# Patient Record
Sex: Female | Born: 1940 | Race: White | Hispanic: No | Smoking: Former smoker
Health system: Southern US, Community
[De-identification: ages and names within clinical notes are randomized; demographics above are authoritative.]

## PROBLEM LIST (undated history)

## (undated) DIAGNOSIS — M1990 Unspecified osteoarthritis, unspecified site: Secondary | ICD-10-CM

## (undated) DIAGNOSIS — K219 Gastro-esophageal reflux disease without esophagitis: Secondary | ICD-10-CM

## (undated) DIAGNOSIS — K317 Polyp of stomach and duodenum: Secondary | ICD-10-CM

## (undated) DIAGNOSIS — M2550 Pain in unspecified joint: Secondary | ICD-10-CM

## (undated) DIAGNOSIS — Z8639 Personal history of other endocrine, nutritional and metabolic disease: Secondary | ICD-10-CM

## (undated) DIAGNOSIS — C3491 Malignant neoplasm of unspecified part of right bronchus or lung: Secondary | ICD-10-CM

## (undated) DIAGNOSIS — M810 Age-related osteoporosis without current pathological fracture: Secondary | ICD-10-CM

## (undated) DIAGNOSIS — E7800 Pure hypercholesterolemia, unspecified: Secondary | ICD-10-CM

## (undated) HISTORY — PX: APPENDECTOMY: SHX54

## (undated) HISTORY — DX: Personal history of other endocrine, nutritional and metabolic disease: Z86.39

## (undated) HISTORY — PX: BREAST SURGERY: SHX581

## (undated) HISTORY — DX: Pure hypercholesterolemia, unspecified: E78.00

## (undated) HISTORY — PX: TONSILLECTOMY: SUR1361

## (undated) HISTORY — PX: EYE SURGERY: SHX253

## (undated) HISTORY — DX: Age-related osteoporosis without current pathological fracture: M81.0

## (undated) HISTORY — PX: ABDOMINAL HYSTERECTOMY: SHX81

## (undated) HISTORY — DX: Pain in unspecified joint: M25.50

## (undated) HISTORY — PX: BREAST EXCISIONAL BIOPSY: SUR124

## (undated) HISTORY — DX: Polyp of stomach and duodenum: K31.7

## (undated) HISTORY — DX: Malignant neoplasm of unspecified part of right bronchus or lung: C34.91

---

## 2005-02-22 ENCOUNTER — Encounter: Admission: RE | Admit: 2005-02-22 | Discharge: 2005-02-22 | Payer: Self-pay | Admitting: Obstetrics and Gynecology

## 2006-02-25 ENCOUNTER — Encounter: Admission: RE | Admit: 2006-02-25 | Discharge: 2006-02-25 | Payer: Self-pay | Admitting: Obstetrics and Gynecology

## 2007-02-27 ENCOUNTER — Encounter: Admission: RE | Admit: 2007-02-27 | Discharge: 2007-02-27 | Payer: Self-pay | Admitting: Obstetrics and Gynecology

## 2008-03-01 ENCOUNTER — Encounter: Admission: RE | Admit: 2008-03-01 | Discharge: 2008-03-01 | Payer: Self-pay | Admitting: Obstetrics and Gynecology

## 2008-03-25 ENCOUNTER — Encounter: Admission: RE | Admit: 2008-03-25 | Discharge: 2008-03-25 | Payer: Self-pay | Admitting: Obstetrics and Gynecology

## 2008-08-16 ENCOUNTER — Ambulatory Visit: Payer: Self-pay | Admitting: Internal Medicine

## 2008-08-16 DIAGNOSIS — K5900 Constipation, unspecified: Secondary | ICD-10-CM | POA: Insufficient documentation

## 2008-08-25 ENCOUNTER — Encounter: Payer: Self-pay | Admitting: Internal Medicine

## 2008-08-25 ENCOUNTER — Ambulatory Visit: Payer: Self-pay | Admitting: Internal Medicine

## 2008-08-26 ENCOUNTER — Encounter: Payer: Self-pay | Admitting: Internal Medicine

## 2008-10-28 ENCOUNTER — Encounter: Admission: RE | Admit: 2008-10-28 | Discharge: 2008-10-28 | Payer: Self-pay | Admitting: Family Medicine

## 2009-03-02 ENCOUNTER — Encounter: Admission: RE | Admit: 2009-03-02 | Discharge: 2009-03-02 | Payer: Self-pay | Admitting: Obstetrics and Gynecology

## 2009-06-16 ENCOUNTER — Encounter: Admission: RE | Admit: 2009-06-16 | Discharge: 2009-06-16 | Payer: Self-pay | Admitting: Family Medicine

## 2009-12-21 ENCOUNTER — Encounter: Admission: RE | Admit: 2009-12-21 | Discharge: 2009-12-21 | Payer: Self-pay | Admitting: Family Medicine

## 2010-03-13 ENCOUNTER — Encounter: Admission: RE | Admit: 2010-03-13 | Discharge: 2010-03-13 | Payer: Self-pay | Admitting: Obstetrics and Gynecology

## 2010-05-06 ENCOUNTER — Encounter: Payer: Self-pay | Admitting: Obstetrics and Gynecology

## 2010-08-16 ENCOUNTER — Other Ambulatory Visit: Payer: Self-pay | Admitting: Family Medicine

## 2010-11-01 ENCOUNTER — Ambulatory Visit
Admission: RE | Admit: 2010-11-01 | Discharge: 2010-11-01 | Disposition: A | Discharge status: Home / Self Care | Payer: Federal, State, Local not specified - PPO | Source: Ambulatory Visit | Attending: Family Medicine | Admitting: Family Medicine

## 2010-12-20 ENCOUNTER — Other Ambulatory Visit: Payer: Self-pay | Admitting: Family Medicine

## 2010-12-20 DIAGNOSIS — R9389 Abnormal findings on diagnostic imaging of other specified body structures: Secondary | ICD-10-CM

## 2011-01-02 ENCOUNTER — Other Ambulatory Visit: Payer: Federal, State, Local not specified - PPO

## 2011-01-02 ENCOUNTER — Ambulatory Visit
Admission: RE | Admit: 2011-01-02 | Discharge: 2011-01-02 | Disposition: A | Discharge status: Home / Self Care | Payer: Medicare Other | Source: Ambulatory Visit | Attending: Family Medicine | Admitting: Family Medicine

## 2011-01-02 DIAGNOSIS — R9389 Abnormal findings on diagnostic imaging of other specified body structures: Secondary | ICD-10-CM

## 2011-03-16 ENCOUNTER — Other Ambulatory Visit: Payer: Self-pay | Admitting: Obstetrics and Gynecology

## 2011-03-16 DIAGNOSIS — Z1231 Encounter for screening mammogram for malignant neoplasm of breast: Secondary | ICD-10-CM

## 2011-03-22 ENCOUNTER — Ambulatory Visit
Admission: RE | Admit: 2011-03-22 | Discharge: 2011-03-22 | Disposition: A | Discharge status: Home / Self Care | Payer: Medicare Other | Source: Ambulatory Visit | Attending: Obstetrics and Gynecology | Admitting: Obstetrics and Gynecology

## 2011-03-22 DIAGNOSIS — Z1231 Encounter for screening mammogram for malignant neoplasm of breast: Secondary | ICD-10-CM

## 2011-04-29 DIAGNOSIS — J019 Acute sinusitis, unspecified: Secondary | ICD-10-CM | POA: Diagnosis not present

## 2011-05-08 DIAGNOSIS — H35379 Puckering of macula, unspecified eye: Secondary | ICD-10-CM | POA: Diagnosis not present

## 2011-05-08 DIAGNOSIS — H43399 Other vitreous opacities, unspecified eye: Secondary | ICD-10-CM | POA: Diagnosis not present

## 2011-05-08 DIAGNOSIS — H43819 Vitreous degeneration, unspecified eye: Secondary | ICD-10-CM | POA: Diagnosis not present

## 2011-06-06 DIAGNOSIS — Z124 Encounter for screening for malignant neoplasm of cervix: Secondary | ICD-10-CM | POA: Diagnosis not present

## 2011-06-20 DIAGNOSIS — M353 Polymyalgia rheumatica: Secondary | ICD-10-CM | POA: Diagnosis not present

## 2011-06-20 DIAGNOSIS — IMO0001 Reserved for inherently not codable concepts without codable children: Secondary | ICD-10-CM | POA: Diagnosis not present

## 2011-07-04 DIAGNOSIS — E7800 Pure hypercholesterolemia, unspecified: Secondary | ICD-10-CM | POA: Diagnosis not present

## 2011-07-04 DIAGNOSIS — R42 Dizziness and giddiness: Secondary | ICD-10-CM | POA: Diagnosis not present

## 2011-07-04 DIAGNOSIS — F411 Generalized anxiety disorder: Secondary | ICD-10-CM | POA: Diagnosis not present

## 2011-07-04 DIAGNOSIS — Z8639 Personal history of other endocrine, nutritional and metabolic disease: Secondary | ICD-10-CM | POA: Diagnosis not present

## 2011-07-04 DIAGNOSIS — J329 Chronic sinusitis, unspecified: Secondary | ICD-10-CM | POA: Diagnosis not present

## 2011-07-04 DIAGNOSIS — Z79899 Other long term (current) drug therapy: Secondary | ICD-10-CM | POA: Diagnosis not present

## 2011-07-05 DIAGNOSIS — J329 Chronic sinusitis, unspecified: Secondary | ICD-10-CM | POA: Diagnosis not present

## 2011-07-05 DIAGNOSIS — E7800 Pure hypercholesterolemia, unspecified: Secondary | ICD-10-CM | POA: Diagnosis not present

## 2011-07-05 DIAGNOSIS — R42 Dizziness and giddiness: Secondary | ICD-10-CM | POA: Diagnosis not present

## 2011-07-05 DIAGNOSIS — Z8639 Personal history of other endocrine, nutritional and metabolic disease: Secondary | ICD-10-CM | POA: Diagnosis not present

## 2011-07-05 DIAGNOSIS — F411 Generalized anxiety disorder: Secondary | ICD-10-CM | POA: Diagnosis not present

## 2011-07-05 DIAGNOSIS — Z79899 Other long term (current) drug therapy: Secondary | ICD-10-CM | POA: Diagnosis not present

## 2011-07-24 DIAGNOSIS — J029 Acute pharyngitis, unspecified: Secondary | ICD-10-CM | POA: Diagnosis not present

## 2011-07-24 DIAGNOSIS — J019 Acute sinusitis, unspecified: Secondary | ICD-10-CM | POA: Diagnosis not present

## 2011-08-15 DIAGNOSIS — J3489 Other specified disorders of nose and nasal sinuses: Secondary | ICD-10-CM | POA: Diagnosis not present

## 2011-08-15 DIAGNOSIS — J029 Acute pharyngitis, unspecified: Secondary | ICD-10-CM | POA: Diagnosis not present

## 2011-09-17 DIAGNOSIS — Z961 Presence of intraocular lens: Secondary | ICD-10-CM | POA: Diagnosis not present

## 2011-12-26 DIAGNOSIS — M353 Polymyalgia rheumatica: Secondary | ICD-10-CM | POA: Diagnosis not present

## 2011-12-26 DIAGNOSIS — M545 Low back pain: Secondary | ICD-10-CM | POA: Diagnosis not present

## 2011-12-26 DIAGNOSIS — IMO0001 Reserved for inherently not codable concepts without codable children: Secondary | ICD-10-CM | POA: Diagnosis not present

## 2011-12-26 DIAGNOSIS — M2550 Pain in unspecified joint: Secondary | ICD-10-CM | POA: Diagnosis not present

## 2011-12-31 DIAGNOSIS — Z23 Encounter for immunization: Secondary | ICD-10-CM | POA: Diagnosis not present

## 2012-01-04 DIAGNOSIS — D485 Neoplasm of uncertain behavior of skin: Secondary | ICD-10-CM | POA: Diagnosis not present

## 2012-01-04 DIAGNOSIS — L819 Disorder of pigmentation, unspecified: Secondary | ICD-10-CM | POA: Diagnosis not present

## 2012-01-04 DIAGNOSIS — L821 Other seborrheic keratosis: Secondary | ICD-10-CM | POA: Diagnosis not present

## 2012-01-04 DIAGNOSIS — L820 Inflamed seborrheic keratosis: Secondary | ICD-10-CM | POA: Diagnosis not present

## 2012-01-04 DIAGNOSIS — D1801 Hemangioma of skin and subcutaneous tissue: Secondary | ICD-10-CM | POA: Diagnosis not present

## 2012-01-04 DIAGNOSIS — D235 Other benign neoplasm of skin of trunk: Secondary | ICD-10-CM | POA: Diagnosis not present

## 2012-01-04 DIAGNOSIS — Z808 Family history of malignant neoplasm of other organs or systems: Secondary | ICD-10-CM | POA: Diagnosis not present

## 2012-01-04 DIAGNOSIS — D239 Other benign neoplasm of skin, unspecified: Secondary | ICD-10-CM | POA: Diagnosis not present

## 2012-01-28 ENCOUNTER — Other Ambulatory Visit: Payer: Self-pay | Admitting: Family Medicine

## 2012-01-28 DIAGNOSIS — R911 Solitary pulmonary nodule: Secondary | ICD-10-CM

## 2012-01-30 ENCOUNTER — Ambulatory Visit
Admission: RE | Admit: 2012-01-30 | Discharge: 2012-01-30 | Disposition: A | Discharge status: Home / Self Care | Payer: Medicare Other | Source: Ambulatory Visit | Attending: Family Medicine | Admitting: Family Medicine

## 2012-01-30 DIAGNOSIS — R911 Solitary pulmonary nodule: Secondary | ICD-10-CM

## 2012-01-30 DIAGNOSIS — J984 Other disorders of lung: Secondary | ICD-10-CM | POA: Diagnosis not present

## 2012-02-14 DIAGNOSIS — M79609 Pain in unspecified limb: Secondary | ICD-10-CM | POA: Diagnosis not present

## 2012-02-19 DIAGNOSIS — I8310 Varicose veins of unspecified lower extremity with inflammation: Secondary | ICD-10-CM | POA: Diagnosis not present

## 2012-03-11 DIAGNOSIS — M545 Low back pain: Secondary | ICD-10-CM | POA: Diagnosis not present

## 2012-03-11 DIAGNOSIS — IMO0001 Reserved for inherently not codable concepts without codable children: Secondary | ICD-10-CM | POA: Diagnosis not present

## 2012-03-11 DIAGNOSIS — M2550 Pain in unspecified joint: Secondary | ICD-10-CM | POA: Diagnosis not present

## 2012-03-11 DIAGNOSIS — M353 Polymyalgia rheumatica: Secondary | ICD-10-CM | POA: Diagnosis not present

## 2012-03-12 DIAGNOSIS — J329 Chronic sinusitis, unspecified: Secondary | ICD-10-CM | POA: Diagnosis not present

## 2012-03-17 ENCOUNTER — Other Ambulatory Visit: Payer: Self-pay | Admitting: Obstetrics and Gynecology

## 2012-03-17 DIAGNOSIS — Z1231 Encounter for screening mammogram for malignant neoplasm of breast: Secondary | ICD-10-CM

## 2012-04-18 ENCOUNTER — Ambulatory Visit
Admission: RE | Admit: 2012-04-18 | Discharge: 2012-04-18 | Disposition: A | Discharge status: Home / Self Care | Payer: Medicare Other | Source: Ambulatory Visit | Attending: Obstetrics and Gynecology | Admitting: Obstetrics and Gynecology

## 2012-04-18 DIAGNOSIS — Z1231 Encounter for screening mammogram for malignant neoplasm of breast: Secondary | ICD-10-CM | POA: Diagnosis not present

## 2012-06-03 DIAGNOSIS — M25519 Pain in unspecified shoulder: Secondary | ICD-10-CM | POA: Diagnosis not present

## 2012-06-03 DIAGNOSIS — IMO0001 Reserved for inherently not codable concepts without codable children: Secondary | ICD-10-CM | POA: Diagnosis not present

## 2012-06-03 DIAGNOSIS — M353 Polymyalgia rheumatica: Secondary | ICD-10-CM | POA: Diagnosis not present

## 2012-06-26 DIAGNOSIS — Z01419 Encounter for gynecological examination (general) (routine) without abnormal findings: Secondary | ICD-10-CM | POA: Diagnosis not present

## 2012-06-26 DIAGNOSIS — Z853 Personal history of malignant neoplasm of breast: Secondary | ICD-10-CM | POA: Diagnosis not present

## 2012-09-02 DIAGNOSIS — M353 Polymyalgia rheumatica: Secondary | ICD-10-CM | POA: Diagnosis not present

## 2012-09-02 DIAGNOSIS — M25519 Pain in unspecified shoulder: Secondary | ICD-10-CM | POA: Diagnosis not present

## 2012-09-02 DIAGNOSIS — M545 Low back pain: Secondary | ICD-10-CM | POA: Diagnosis not present

## 2012-10-30 DIAGNOSIS — Z961 Presence of intraocular lens: Secondary | ICD-10-CM | POA: Diagnosis not present

## 2012-12-04 DIAGNOSIS — M353 Polymyalgia rheumatica: Secondary | ICD-10-CM | POA: Diagnosis not present

## 2012-12-04 DIAGNOSIS — M2550 Pain in unspecified joint: Secondary | ICD-10-CM | POA: Diagnosis not present

## 2012-12-04 DIAGNOSIS — M545 Low back pain: Secondary | ICD-10-CM | POA: Diagnosis not present

## 2012-12-04 DIAGNOSIS — IMO0001 Reserved for inherently not codable concepts without codable children: Secondary | ICD-10-CM | POA: Diagnosis not present

## 2012-12-24 DIAGNOSIS — E7800 Pure hypercholesterolemia, unspecified: Secondary | ICD-10-CM | POA: Diagnosis not present

## 2012-12-24 DIAGNOSIS — M810 Age-related osteoporosis without current pathological fracture: Secondary | ICD-10-CM | POA: Diagnosis not present

## 2012-12-24 DIAGNOSIS — R5381 Other malaise: Secondary | ICD-10-CM | POA: Diagnosis not present

## 2012-12-24 DIAGNOSIS — F411 Generalized anxiety disorder: Secondary | ICD-10-CM | POA: Diagnosis not present

## 2012-12-24 DIAGNOSIS — R911 Solitary pulmonary nodule: Secondary | ICD-10-CM | POA: Diagnosis not present

## 2012-12-24 DIAGNOSIS — R079 Chest pain, unspecified: Secondary | ICD-10-CM | POA: Diagnosis not present

## 2012-12-25 ENCOUNTER — Other Ambulatory Visit: Payer: Self-pay | Admitting: Family Medicine

## 2012-12-25 DIAGNOSIS — E7800 Pure hypercholesterolemia, unspecified: Secondary | ICD-10-CM

## 2012-12-31 DIAGNOSIS — M79609 Pain in unspecified limb: Secondary | ICD-10-CM | POA: Diagnosis not present

## 2013-01-08 DIAGNOSIS — L708 Other acne: Secondary | ICD-10-CM | POA: Diagnosis not present

## 2013-01-08 DIAGNOSIS — L821 Other seborrheic keratosis: Secondary | ICD-10-CM | POA: Diagnosis not present

## 2013-01-08 DIAGNOSIS — I8310 Varicose veins of unspecified lower extremity with inflammation: Secondary | ICD-10-CM | POA: Diagnosis not present

## 2013-01-08 DIAGNOSIS — D485 Neoplasm of uncertain behavior of skin: Secondary | ICD-10-CM | POA: Diagnosis not present

## 2013-01-08 DIAGNOSIS — D239 Other benign neoplasm of skin, unspecified: Secondary | ICD-10-CM | POA: Diagnosis not present

## 2013-01-08 DIAGNOSIS — Z808 Family history of malignant neoplasm of other organs or systems: Secondary | ICD-10-CM | POA: Diagnosis not present

## 2013-01-08 DIAGNOSIS — D1801 Hemangioma of skin and subcutaneous tissue: Secondary | ICD-10-CM | POA: Diagnosis not present

## 2013-01-08 DIAGNOSIS — L819 Disorder of pigmentation, unspecified: Secondary | ICD-10-CM | POA: Diagnosis not present

## 2013-01-08 DIAGNOSIS — D2330 Other benign neoplasm of skin of unspecified part of face: Secondary | ICD-10-CM | POA: Diagnosis not present

## 2013-01-08 DIAGNOSIS — L820 Inflamed seborrheic keratosis: Secondary | ICD-10-CM | POA: Diagnosis not present

## 2013-01-12 DIAGNOSIS — I8310 Varicose veins of unspecified lower extremity with inflammation: Secondary | ICD-10-CM | POA: Diagnosis not present

## 2013-01-14 DIAGNOSIS — I8310 Varicose veins of unspecified lower extremity with inflammation: Secondary | ICD-10-CM | POA: Diagnosis not present

## 2013-01-14 DIAGNOSIS — C3491 Malignant neoplasm of unspecified part of right bronchus or lung: Secondary | ICD-10-CM

## 2013-01-14 HISTORY — DX: Malignant neoplasm of unspecified part of right bronchus or lung: C34.91

## 2013-01-20 ENCOUNTER — Other Ambulatory Visit: Payer: Self-pay

## 2013-01-20 ENCOUNTER — Ambulatory Visit
Admission: RE | Admit: 2013-01-20 | Discharge: 2013-01-20 | Disposition: A | Discharge status: Home / Self Care | Payer: Medicare Other | Source: Ambulatory Visit | Attending: Family Medicine | Admitting: Family Medicine

## 2013-01-20 DIAGNOSIS — E7800 Pure hypercholesterolemia, unspecified: Secondary | ICD-10-CM

## 2013-01-20 DIAGNOSIS — R918 Other nonspecific abnormal finding of lung field: Secondary | ICD-10-CM | POA: Insufficient documentation

## 2013-01-20 DIAGNOSIS — J438 Other emphysema: Secondary | ICD-10-CM | POA: Diagnosis not present

## 2013-01-21 ENCOUNTER — Other Ambulatory Visit: Payer: Self-pay | Admitting: *Deleted

## 2013-01-21 ENCOUNTER — Encounter: Payer: Self-pay | Admitting: Thoracic Surgery (Cardiothoracic Vascular Surgery)

## 2013-01-21 ENCOUNTER — Institutional Professional Consult (permissible substitution) (INDEPENDENT_AMBULATORY_CARE_PROVIDER_SITE_OTHER): Payer: Medicare Other | Admitting: Thoracic Surgery (Cardiothoracic Vascular Surgery)

## 2013-01-21 ENCOUNTER — Encounter (HOSPITAL_COMMUNITY): Payer: Self-pay | Admitting: Pharmacist

## 2013-01-21 VITALS — BP 137/68 | HR 90 | Resp 16 | Ht 68.0 in | Wt 148.0 lb

## 2013-01-21 DIAGNOSIS — M810 Age-related osteoporosis without current pathological fracture: Secondary | ICD-10-CM | POA: Diagnosis not present

## 2013-01-21 DIAGNOSIS — R918 Other nonspecific abnormal finding of lung field: Secondary | ICD-10-CM

## 2013-01-21 DIAGNOSIS — M129 Arthropathy, unspecified: Secondary | ICD-10-CM | POA: Diagnosis not present

## 2013-01-21 DIAGNOSIS — E785 Hyperlipidemia, unspecified: Secondary | ICD-10-CM | POA: Diagnosis not present

## 2013-01-21 DIAGNOSIS — H269 Unspecified cataract: Secondary | ICD-10-CM

## 2013-01-21 DIAGNOSIS — M1990 Unspecified osteoarthritis, unspecified site: Secondary | ICD-10-CM | POA: Insufficient documentation

## 2013-01-21 NOTE — Progress Notes (Signed)
PCP is Gaye Alken, MD Referring Provider is Marthe Patch*  Chief Complaint  Patient presents with  . Lung Lesion    RML and RLL...eval and treat...CT CHEST    HPI: 72 year old woman who presents with chief complaint of enlarging lung nodule.  Mrs. Brazzle is a 72 year old woman with a 40-pack-year history of smoking who quit in 2007. She has been followed for about 3-1/2 years now for a groundglass opacity in the superior segment of her right lower lobe. Initially this really did not show any significant change. On her CT in 2013 it does appear that it was becoming a little more prominent. She had a repeat CT done October 7 showed a definite increase in size and now possibly a small soft tissue component. There also is a 5 mm subpleural nodule in the middle lobe that has not changed over time.  She smoked up until 2007 was finally able to quit at that time. She has not had any history of cardiac disease. She denies chest pain or shortness of breath. She does have an occasional cough which is been nonproductive. She denies hemoptysis. She has gained the weight recently. She also has had some decreased energy and cold intolerance. She's not had any history of excessive bleeding or bruising. She did have a laser vein ablation in her left thigh recently and has some bruising from that.   Past Medical History  Diagnosis Date  . Hyperplastic polyps of stomach   . Osteoporosis   . H/O vitamin D deficiency   . Hypercholesteremia   . Joint pain     No past surgical history on file.  Family History  Problem Relation Age of Onset  . Lung cancer Father   . Cancer - Other Brother   . Diabetes Mellitus II Father   . Hypertension Father   . Hyperlipidemia Father     Social History History  Substance Use Topics  . Smoking status: Former Smoker -- 1.00 packs/day for 40 years    Types: Cigarettes    Quit date: 04/16/2005  . Smokeless tobacco: Never Used  . Alcohol Use:  No    Current Outpatient Prescriptions  Medication Sig Dispense Refill  . ALPRAZolam (XANAX) 0.5 MG tablet Take 0.25 mg by mouth 3 (three) times daily as needed for anxiety. Takes 1-2 tablets at a time      . atorvastatin (LIPITOR) 20 MG tablet Take 20 mg by mouth daily.       Marland Kitchen FLUZONE HIGH-DOSE injection       . meclizine (ANTIVERT) 25 MG tablet Take 25 mg by mouth 3 (three) times daily as needed.       . meloxicam (MOBIC) 15 MG tablet 15 mg daily as needed.       . raloxifene (EVISTA) 60 MG tablet Take 60 mg by mouth every other day.        No current facility-administered medications for this visit.    Allergies  Allergen Reactions  . Erythromycin Other (See Comments)    Upset stomach    Review of Systems  Constitutional: Positive for fatigue and unexpected weight change (weight gain). Negative for fever, chills, activity change and appetite change.  Eyes:       Glasses, cataracts  Respiratory: Positive for cough (occasional). Negative for shortness of breath, wheezing and stridor.   Cardiovascular: Positive for leg swelling. Negative for chest pain.       Varicose veins  Musculoskeletal: Positive for joint swelling and myalgias.  Hematological: Negative for adenopathy. Does not bruise/bleed easily.  Psychiatric/Behavioral: The patient is nervous/anxious.   All other systems reviewed and are negative.    BP 137/68  Pulse 90  Resp 16  Ht 5\' 8"  (1.727 m)  Wt 148 lb (67.132 kg)  BMI 22.51 kg/m2 Physical Exam  Vitals reviewed. Constitutional: She is oriented to person, place, and time. She appears well-developed and well-nourished. No distress.  HENT:  Head: Normocephalic and atraumatic.  Eyes: EOM are normal. Pupils are equal, round, and reactive to light.  Neck: Neck supple. No thyromegaly present.  Cardiovascular: Normal rate, regular rhythm and intact distal pulses.  Exam reveals no gallop and no friction rub.   No murmur heard. Pulmonary/Chest: Effort normal and  breath sounds normal. She has no wheezes. She has no rales.  Abdominal: Soft. There is no tenderness.  Musculoskeletal: She exhibits no edema.  ecchymosis left thigh from recent laser vein ablation  Lymphadenopathy:    She has no cervical adenopathy.  Neurological: She is alert and oriented to person, place, and time. No cranial nerve deficit.  No focal motor deficit  Skin: Skin is warm and dry.     Diagnostic Tests: CT chest 01/20/2013 CLINICAL DATA: Followup of pulmonary nodule  EXAM:  CT CHEST WITHOUT CONTRAST  TECHNIQUE:  Multidetector CT imaging of the chest was performed following the  standard protocol without IV contrast.  COMPARISON: CT chest of 01/30/2012  FINDINGS:  Diffuse changes of centrilobular emphysema are again noted. The sub  solid ground-glass opacity within the superior segment of the right  lower lobe posteriorly has definitely increased in size.  Particularly when compared to the CT from September 2012, this sub  solid ground-glass opacities now measures 2.3 x 2.5 centimeter on  coronal images compared to approximately 1.3 x 1.4 centimeter. A  slow growing adenocarcinoma is therefore a definite consideration  and thoracic surgery consultation is recommended. The pleural-based  nodule within the right middle lobe is relatively stable measuring 5  millimeters in diameter. No new pulmonary nodule is seen. No pleural  effusion is noted. The central airway is patent.  On soft tissue window images, the thyroid gland is small and  unremarkable. On this unenhanced study, no mediastinal or hilar  adenopathy is seen. Calcified mediastinal nodes are present  presumably due to prior granulomatous disease. Coronary artery  calcifications also are noted and there is mild cardiomegaly  present. The portion of the upper abdomen that is visualized is  unremarkable other than a few calcified splenic granulomas. Minimal  compression of T8 vertebral body is noted which may  be increased  slightly in the interval.  IMPRESSION:  1. Increase in size of the sub solid ground-glass opacity within the  superior segment of the right lower lobe, concerning for slow  growing adenocarcinoma. Recommend thoracic surgery consultation.  2. Diffuse centrilobular emphysema.  3. Stable pleural-based 5 mm nodule within the right middle lobe.  4. Mild compression deformity of T8.  Electronically Signed  By: Dwyane Dee M.D.  On: 01/20/2013 11:20  Impression: 72 year old woman with an enlarging groundglass opacity in the superior segment of her right lower lobe. As was relatively stable in size for a couple of years but over the past year has shown definite growth. 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 Given that it has increased in size I do recommend that we resect the lesion. I do not think a PET will be helpful in this case given the  groundglass appearance as it most likely will not be PET avid.  She also has a small subpleural nodule in the middle lobe which has not changed. The given that we will be in the chest I recommended we go ahead and perform a wedge resection on that at the same time as it will not cause any significant change in operative time or risk.  We did discuss other options such as continued observation, which would not be recommended, and  biopsy either bronchoscopically or percutaneously, neither of which would definitively rule out low grade adenocarcinoma if they were negative.  I have discussed with Mrs. Schwenn the nature of the procedure, the need for general anesthesia, and the incisions to be used. We discussed the expected hospital stay, overall recovery and short and long term outcomes. She understands the risks include, but are not limited to, death, stroke, MI, DVT/PE, bleeding, possible need for transfusion, infe cardiac arrhythmias, prolonged air leaks, as well as the possibility of unforeseeable  complications.  She does need PFTs prior surgery, but I don't think to be any issues with pulmonary reserve given the limited resection involved.  She understands and accepts the risks and agrees to proceed.  Plan: Pulmonary function tests with room air blood gas  Right VATS, lower lobe segmentectomy, wedge resection of middle lobe on Monday October 13.

## 2013-01-23 ENCOUNTER — Encounter (HOSPITAL_COMMUNITY): Payer: Self-pay

## 2013-01-23 ENCOUNTER — Ambulatory Visit (HOSPITAL_COMMUNITY)
Admission: RE | Admit: 2013-01-23 | Discharge: 2013-01-23 | Disposition: A | Discharge status: Home / Self Care | Payer: Medicare Other | Source: Ambulatory Visit | Attending: Thoracic Surgery (Cardiothoracic Vascular Surgery) | Admitting: Thoracic Surgery (Cardiothoracic Vascular Surgery)

## 2013-01-23 ENCOUNTER — Encounter (HOSPITAL_COMMUNITY)
Admission: RE | Admit: 2013-01-23 | Discharge: 2013-01-23 | Disposition: A | Discharge status: Home / Self Care | Payer: Medicare Other | Source: Ambulatory Visit | Attending: Thoracic Surgery (Cardiothoracic Vascular Surgery) | Admitting: Thoracic Surgery (Cardiothoracic Vascular Surgery)

## 2013-01-23 VITALS — BP 142/73 | HR 70 | Temp 98.1°F | Resp 18 | Ht 68.0 in | Wt 144.8 lb

## 2013-01-23 DIAGNOSIS — Z87891 Personal history of nicotine dependence: Secondary | ICD-10-CM | POA: Insufficient documentation

## 2013-01-23 DIAGNOSIS — R918 Other nonspecific abnormal finding of lung field: Secondary | ICD-10-CM

## 2013-01-23 DIAGNOSIS — R911 Solitary pulmonary nodule: Secondary | ICD-10-CM | POA: Diagnosis not present

## 2013-01-23 DIAGNOSIS — Z01812 Encounter for preprocedural laboratory examination: Secondary | ICD-10-CM | POA: Insufficient documentation

## 2013-01-23 DIAGNOSIS — Z01818 Encounter for other preprocedural examination: Secondary | ICD-10-CM | POA: Insufficient documentation

## 2013-01-23 HISTORY — DX: Unspecified osteoarthritis, unspecified site: M19.90

## 2013-01-23 HISTORY — DX: Gastro-esophageal reflux disease without esophagitis: K21.9

## 2013-01-23 LAB — TYPE AND SCREEN: Antibody Screen: NEGATIVE

## 2013-01-23 LAB — CBC
HCT: 38.9 % (ref 36.0–46.0)
MCH: 30.5 pg (ref 26.0–34.0)
Platelets: 268 10*3/uL (ref 150–400)
RBC: 4.26 MIL/uL (ref 3.87–5.11)
WBC: 6.5 10*3/uL (ref 4.0–10.5)

## 2013-01-23 LAB — PULMONARY FUNCTION TEST

## 2013-01-23 LAB — SURGICAL PCR SCREEN
MRSA, PCR: NEGATIVE
Staphylococcus aureus: NEGATIVE

## 2013-01-23 LAB — COMPREHENSIVE METABOLIC PANEL
ALT: 14 U/L (ref 0–35)
AST: 24 U/L (ref 0–37)
CO2: 23 mEq/L (ref 19–32)
Chloride: 100 mEq/L (ref 96–112)
GFR calc non Af Amer: 68 mL/min — ABNORMAL LOW (ref >90)
Potassium: 3.5 mEq/L (ref 3.5–5.1)
Sodium: 137 mEq/L (ref 135–145)
Total Bilirubin: 0.3 mg/dL (ref 0.3–1.2)

## 2013-01-23 LAB — PROTIME-INR: INR: 1.07 (ref 0.00–1.49)

## 2013-01-23 MED ORDER — ALBUTEROL SULFATE (5 MG/ML) 0.5% IN NEBU
2.5000 mg | INHALATION_SOLUTION | Freq: Once | RESPIRATORY_TRACT | Status: AC
  Administered 2013-01-23: 2.5 mg via RESPIRATORY_TRACT

## 2013-01-23 NOTE — Pre-Procedure Instructions (Signed)
[name]  01/23/2013   Your procedure is scheduled on:  Monday January 26, 2013 at 1031 AM.  Report to [facility] Entrance"A" at 7:30 AM.  Call this number if you have problems the morning of surgery: [phone]   Remember:   Do not eat food or drink liquids after midnight Sunday 01/25/13.   Take these medicines the morning of surgery with A SIP OF WATER: Prilosec                            Stop all Vitamins, Herbal Medication, Aspirin , NSAIDS and Fish oil.   Do not wear jewelry, make-up or nail polish.  Do not wear lotions, powders, or perfumes. You may wear deodorant.  Do not shave 48 hours prior to surgery. .  Do not bring valuables to the hospital.  [facility] is not responsible for any belongings or valuables.                                     Contacts, dentures or bridgework may not be worn into surgery.  Leave suitcase in the car. After surgery it may be brought to your room.  For patients admitted to the hospital, discharge time is determined by your treatment team.               Patients discharged the day of surgery will not be allowed to drive home.  Name and phone number of your driver:   Special Instructions: Shower using CHG 2 nights before surgery and the night before surgery.  If you shower the day of surgery use CHG.  Use special wash - you have one bottle of CHG for all showers.  You should use approximately 1/3 of the bottle for each shower.   Please read over the following fact sheets that you were given: Pain Booklet, Coughing and Deep Breathing, Blood Transfusion Information, MRSA Information and Surgical Site Infection Prevention

## 2013-01-23 NOTE — Progress Notes (Signed)
Per RT ABG was venous will obtain new same DOS

## 2013-01-25 MED ORDER — DEXTROSE 5 % IV SOLN
1.5000 g | INTRAVENOUS | Status: AC
  Administered 2013-01-26: 1.5 g via INTRAVENOUS
  Filled 2013-01-25: qty 1.5

## 2013-01-26 ENCOUNTER — Encounter (HOSPITAL_COMMUNITY): Payer: Self-pay | Admitting: Certified Registered"

## 2013-01-26 ENCOUNTER — Inpatient Hospital Stay (HOSPITAL_COMMUNITY)
Admission: RE | Admit: 2013-01-26 | Discharge: 2013-02-03 | DRG: 164 | Disposition: A | Discharge status: Home / Self Care | Payer: Medicare Other | Source: Ambulatory Visit | Attending: Thoracic Surgery (Cardiothoracic Vascular Surgery) | Admitting: Thoracic Surgery (Cardiothoracic Vascular Surgery)

## 2013-01-26 ENCOUNTER — Encounter (HOSPITAL_COMMUNITY)
Admission: RE | Disposition: A | Discharge status: Home / Self Care | Payer: Self-pay | Source: Ambulatory Visit | Attending: Thoracic Surgery (Cardiothoracic Vascular Surgery)

## 2013-01-26 ENCOUNTER — Encounter (HOSPITAL_COMMUNITY): Payer: Medicare Other | Admitting: Certified Registered"

## 2013-01-26 ENCOUNTER — Inpatient Hospital Stay (HOSPITAL_COMMUNITY): Payer: Medicare Other

## 2013-01-26 ENCOUNTER — Inpatient Hospital Stay (HOSPITAL_COMMUNITY): Payer: Medicare Other | Admitting: Certified Registered"

## 2013-01-26 DIAGNOSIS — Z801 Family history of malignant neoplasm of trachea, bronchus and lung: Secondary | ICD-10-CM

## 2013-01-26 DIAGNOSIS — K5900 Constipation, unspecified: Secondary | ICD-10-CM | POA: Diagnosis not present

## 2013-01-26 DIAGNOSIS — N390 Urinary tract infection, site not specified: Secondary | ICD-10-CM | POA: Diagnosis not present

## 2013-01-26 DIAGNOSIS — J984 Other disorders of lung: Secondary | ICD-10-CM | POA: Diagnosis not present

## 2013-01-26 DIAGNOSIS — C3430 Malignant neoplasm of lower lobe, unspecified bronchus or lung: Principal | ICD-10-CM | POA: Diagnosis present

## 2013-01-26 DIAGNOSIS — M810 Age-related osteoporosis without current pathological fracture: Secondary | ICD-10-CM | POA: Diagnosis present

## 2013-01-26 DIAGNOSIS — M2559 Pain in other specified joint: Secondary | ICD-10-CM | POA: Diagnosis not present

## 2013-01-26 DIAGNOSIS — Z87891 Personal history of nicotine dependence: Secondary | ICD-10-CM

## 2013-01-26 DIAGNOSIS — J95811 Postprocedural pneumothorax: Secondary | ICD-10-CM | POA: Diagnosis not present

## 2013-01-26 DIAGNOSIS — R339 Retention of urine, unspecified: Secondary | ICD-10-CM | POA: Diagnosis present

## 2013-01-26 DIAGNOSIS — D1430 Benign neoplasm of unspecified bronchus and lung: Secondary | ICD-10-CM | POA: Diagnosis not present

## 2013-01-26 DIAGNOSIS — Z902 Acquired absence of lung [part of]: Secondary | ICD-10-CM

## 2013-01-26 DIAGNOSIS — R918 Other nonspecific abnormal finding of lung field: Secondary | ICD-10-CM

## 2013-01-26 DIAGNOSIS — K219 Gastro-esophageal reflux disease without esophagitis: Secondary | ICD-10-CM | POA: Diagnosis not present

## 2013-01-26 DIAGNOSIS — J9383 Other pneumothorax: Secondary | ICD-10-CM | POA: Diagnosis not present

## 2013-01-26 DIAGNOSIS — D62 Acute posthemorrhagic anemia: Secondary | ICD-10-CM | POA: Diagnosis not present

## 2013-01-26 DIAGNOSIS — D131 Benign neoplasm of stomach: Secondary | ICD-10-CM | POA: Diagnosis present

## 2013-01-26 DIAGNOSIS — J90 Pleural effusion, not elsewhere classified: Secondary | ICD-10-CM | POA: Diagnosis not present

## 2013-01-26 DIAGNOSIS — A498 Other bacterial infections of unspecified site: Secondary | ICD-10-CM | POA: Diagnosis present

## 2013-01-26 DIAGNOSIS — R404 Transient alteration of awareness: Secondary | ICD-10-CM | POA: Diagnosis not present

## 2013-01-26 DIAGNOSIS — Z4682 Encounter for fitting and adjustment of non-vascular catheter: Secondary | ICD-10-CM | POA: Diagnosis not present

## 2013-01-26 DIAGNOSIS — Z79899 Other long term (current) drug therapy: Secondary | ICD-10-CM

## 2013-01-26 DIAGNOSIS — J9819 Other pulmonary collapse: Secondary | ICD-10-CM | POA: Diagnosis not present

## 2013-01-26 DIAGNOSIS — E7800 Pure hypercholesterolemia, unspecified: Secondary | ICD-10-CM | POA: Diagnosis present

## 2013-01-26 HISTORY — PX: VIDEO ASSISTED THORACOSCOPY (VATS)/WEDGE RESECTION: SHX6174

## 2013-01-26 HISTORY — PX: LUNG REMOVAL, PARTIAL: SHX233

## 2013-01-26 LAB — BLOOD GAS, ARTERIAL
Acid-Base Excess: 0.5 mmol/L (ref 0.0–2.0)
Bicarbonate: 24.2 mEq/L — ABNORMAL HIGH (ref 20.0–24.0)
Drawn by: 181601
FIO2: 0.21 %
O2 Saturation: 97.7 %
Patient temperature: 98.6
pCO2 arterial: 36 mmHg (ref 35.0–45.0)

## 2013-01-26 LAB — URINALYSIS, ROUTINE W REFLEX MICROSCOPIC
Hgb urine dipstick: NEGATIVE
Nitrite: NEGATIVE
Protein, ur: NEGATIVE mg/dL
Urobilinogen, UA: 0.2 mg/dL (ref 0.0–1.0)
pH: 5.5 (ref 5.0–8.0)

## 2013-01-26 SURGERY — VIDEO ASSISTED THORACOSCOPY (VATS)/WEDGE RESECTION
Anesthesia: General | Site: Chest | Laterality: Right | Wound class: Clean Contaminated

## 2013-01-26 MED ORDER — OXYCODONE HCL 5 MG PO TABS
ORAL_TABLET | ORAL | Status: AC
  Filled 2013-01-26: qty 1

## 2013-01-26 MED ORDER — HYPROMELLOSE (GONIOSCOPIC) 2.5 % OP SOLN
1.0000 [drp] | Freq: Every day | OPHTHALMIC | Status: DC
  Filled 2013-01-26: qty 15

## 2013-01-26 MED ORDER — HYDROMORPHONE HCL PF 1 MG/ML IJ SOLN
INTRAMUSCULAR | Status: AC
  Filled 2013-01-26: qty 1

## 2013-01-26 MED ORDER — OXYCODONE HCL 5 MG PO TABS
5.0000 mg | ORAL_TABLET | ORAL | Status: DC | PRN
  Administered 2013-01-26 – 2013-01-27 (×4): 10 mg via ORAL
  Administered 2013-01-27: 5 mg via ORAL
  Filled 2013-01-26: qty 2
  Filled 2013-01-26: qty 1
  Filled 2013-01-26 (×2): qty 2

## 2013-01-26 MED ORDER — ACETAMINOPHEN 10 MG/ML IV SOLN
1000.0000 mg | Freq: Once | INTRAVENOUS | Status: AC
  Administered 2013-01-26: 1000 mg via INTRAVENOUS

## 2013-01-26 MED ORDER — MIDAZOLAM HCL 5 MG/5ML IJ SOLN
INTRAMUSCULAR | Status: DC | PRN
  Administered 2013-01-26: 2 mg via INTRAVENOUS

## 2013-01-26 MED ORDER — ACETAMINOPHEN 500 MG PO TABS
1000.0000 mg | ORAL_TABLET | Freq: Four times a day (QID) | ORAL | Status: DC
  Administered 2013-01-26 – 2013-01-27 (×3): 1000 mg via ORAL
  Filled 2013-01-26 (×4): qty 2

## 2013-01-26 MED ORDER — LEVALBUTEROL HCL 0.63 MG/3ML IN NEBU
0.6300 mg | INHALATION_SOLUTION | Freq: Four times a day (QID) | RESPIRATORY_TRACT | Status: DC
  Administered 2013-01-26 – 2013-01-27 (×3): 0.63 mg via RESPIRATORY_TRACT
  Filled 2013-01-26 (×7): qty 3

## 2013-01-26 MED ORDER — PHENYLEPHRINE HCL 10 MG/ML IJ SOLN
10.0000 mg | INTRAVENOUS | Status: DC | PRN
  Administered 2013-01-26: 40 ug/min via INTRAVENOUS

## 2013-01-26 MED ORDER — OXYCODONE HCL 5 MG PO TABS: 5.0000 mg | ORAL_TABLET | Freq: Once | ORAL | Status: DC | PRN

## 2013-01-26 MED ORDER — SODIUM CHLORIDE 0.9 % IJ SOLN: 9.0000 mL | INTRAMUSCULAR | Status: DC | PRN

## 2013-01-26 MED ORDER — ONDANSETRON HCL 4 MG/2ML IJ SOLN: 4.0000 mg | Freq: Once | INTRAMUSCULAR | Status: DC | PRN

## 2013-01-26 MED ORDER — 0.9 % SODIUM CHLORIDE (POUR BTL) OPTIME
TOPICAL | Status: DC | PRN
  Administered 2013-01-26: 2000 mL

## 2013-01-26 MED ORDER — POTASSIUM CHLORIDE 10 MEQ/50ML IV SOLN: 10.0000 meq | Freq: Every day | INTRAVENOUS | Status: DC | PRN

## 2013-01-26 MED ORDER — RALOXIFENE HCL 60 MG PO TABS
60.0000 mg | ORAL_TABLET | ORAL | Status: DC
  Administered 2013-01-28 – 2013-02-02 (×4): 60 mg via ORAL
  Filled 2013-01-26 (×4): qty 1

## 2013-01-26 MED ORDER — OXYCODONE HCL 5 MG/5ML PO SOLN: 5.0000 mg | Freq: Once | ORAL | Status: DC | PRN

## 2013-01-26 MED ORDER — GLYCOPYRROLATE 0.2 MG/ML IJ SOLN
INTRAMUSCULAR | Status: DC | PRN
  Administered 2013-01-26: .5 mg via INTRAVENOUS
  Administered 2013-01-26: 0.2 mg via INTRAVENOUS

## 2013-01-26 MED ORDER — HYDROMORPHONE HCL PF 1 MG/ML IJ SOLN
0.2500 mg | INTRAMUSCULAR | Status: DC | PRN
  Administered 2013-01-26 (×4): 0.5 mg via INTRAVENOUS

## 2013-01-26 MED ORDER — NALOXONE HCL 0.4 MG/ML IJ SOLN
0.4000 mg | INTRAMUSCULAR | Status: DC | PRN
  Filled 2013-01-26: qty 1

## 2013-01-26 MED ORDER — ALBUMIN HUMAN 5 % IV SOLN
INTRAVENOUS | Status: AC
  Filled 2013-01-26: qty 250

## 2013-01-26 MED ORDER — RISAQUAD PO CAPS
1.0000 | ORAL_CAPSULE | Freq: Every day | ORAL | Status: DC
  Administered 2013-01-29 – 2013-02-03 (×5): 1 via ORAL
  Filled 2013-01-26 (×8): qty 1

## 2013-01-26 MED ORDER — HEMOSTATIC AGENTS (NO CHARGE) OPTIME
TOPICAL | Status: DC | PRN
  Administered 2013-01-26: 1 via TOPICAL

## 2013-01-26 MED ORDER — PHENYLEPHRINE HCL 10 MG/ML IJ SOLN
INTRAMUSCULAR | Status: DC | PRN
  Administered 2013-01-26 (×2): 80 ug via INTRAVENOUS

## 2013-01-26 MED ORDER — OXYCODONE-ACETAMINOPHEN 5-325 MG PO TABS
1.0000 | ORAL_TABLET | ORAL | Status: DC | PRN
  Filled 2013-01-26: qty 2

## 2013-01-26 MED ORDER — ATORVASTATIN CALCIUM 20 MG PO TABS
20.0000 mg | ORAL_TABLET | Freq: Every day | ORAL | Status: DC
  Administered 2013-01-27 – 2013-02-02 (×7): 20 mg via ORAL
  Filled 2013-01-26 (×10): qty 1

## 2013-01-26 MED ORDER — NEOSTIGMINE METHYLSULFATE 1 MG/ML IJ SOLN
INTRAMUSCULAR | Status: DC | PRN
  Administered 2013-01-26: 3 mg via INTRAVENOUS
  Administered 2013-01-26: 2 mg via INTRAVENOUS

## 2013-01-26 MED ORDER — PROBIOTIC DAILY PO CAPS: 1.0000 | ORAL_CAPSULE | Freq: Every day | ORAL | Status: DC

## 2013-01-26 MED ORDER — ACETAMINOPHEN 160 MG/5ML PO SOLN
1000.0000 mg | Freq: Four times a day (QID) | ORAL | Status: DC
  Filled 2013-01-26: qty 40.6

## 2013-01-26 MED ORDER — PHENYLEPHRINE HCL 10 MG/ML IJ SOLN
30.0000 ug/min | INTRAVENOUS | Status: DC
  Administered 2013-01-26: 13.333 ug/min via INTRAVENOUS

## 2013-01-26 MED ORDER — B COMPLEX-C PO TABS
1.0000 | ORAL_TABLET | Freq: Every day | ORAL | Status: DC
  Administered 2013-01-27 – 2013-02-03 (×8): 1 via ORAL
  Filled 2013-01-26 (×8): qty 1

## 2013-01-26 MED ORDER — LACTATED RINGERS IV SOLN
INTRAVENOUS | Status: DC
  Administered 2013-01-26: 10:00:00 via INTRAVENOUS

## 2013-01-26 MED ORDER — PROPOFOL 10 MG/ML IV BOLUS
INTRAVENOUS | Status: DC | PRN
  Administered 2013-01-26: 10 mg via INTRAVENOUS
  Administered 2013-01-26: 170 mg via INTRAVENOUS
  Administered 2013-01-26: 10 mg via INTRAVENOUS

## 2013-01-26 MED ORDER — ACETAMINOPHEN 10 MG/ML IV SOLN
INTRAVENOUS | Status: AC
  Filled 2013-01-26: qty 100

## 2013-01-26 MED ORDER — ONDANSETRON HCL 4 MG/2ML IJ SOLN
4.0000 mg | Freq: Four times a day (QID) | INTRAMUSCULAR | Status: DC | PRN
  Administered 2013-01-26 – 2013-01-27 (×2): 4 mg via INTRAVENOUS
  Filled 2013-01-26 (×3): qty 2

## 2013-01-26 MED ORDER — ROCURONIUM BROMIDE 100 MG/10ML IV SOLN
INTRAVENOUS | Status: DC | PRN
  Administered 2013-01-26 (×4): 10 mg via INTRAVENOUS
  Administered 2013-01-26: 40 mg via INTRAVENOUS

## 2013-01-26 MED ORDER — DIPHENHYDRAMINE HCL 50 MG/ML IJ SOLN
12.5000 mg | Freq: Four times a day (QID) | INTRAMUSCULAR | Status: DC | PRN
  Filled 2013-01-26: qty 0.25

## 2013-01-26 MED ORDER — VITAMIN D3 25 MCG (1000 UNIT) PO TABS
2000.0000 [IU] | ORAL_TABLET | Freq: Every day | ORAL | Status: DC
  Administered 2013-01-27 – 2013-02-03 (×8): 2000 [IU] via ORAL
  Filled 2013-01-26 (×8): qty 2

## 2013-01-26 MED ORDER — BISACODYL 5 MG PO TBEC
10.0000 mg | DELAYED_RELEASE_TABLET | Freq: Every day | ORAL | Status: DC
  Administered 2013-01-27 – 2013-02-03 (×8): 10 mg via ORAL
  Filled 2013-01-26 (×8): qty 2

## 2013-01-26 MED ORDER — SUFENTANIL CITRATE 50 MCG/ML IV SOLN
INTRAVENOUS | Status: DC | PRN
  Administered 2013-01-26 (×2): 5 ug via INTRAVENOUS
  Administered 2013-01-26: 15 ug via INTRAVENOUS
  Administered 2013-01-26: 5 ug via INTRAVENOUS

## 2013-01-26 MED ORDER — VITAMIN D 50 MCG (2000 UT) PO TABS: 2000.0000 [IU] | ORAL_TABLET | Freq: Every day | ORAL | Status: DC

## 2013-01-26 MED ORDER — FENTANYL 10 MCG/ML IV SOLN
INTRAVENOUS | Status: DC
  Administered 2013-01-26: 14:00:00 via INTRAVENOUS
  Administered 2013-01-26: 80 ug via INTRAVENOUS
  Administered 2013-01-26: 70 ug via INTRAVENOUS
  Administered 2013-01-27: 250 ug via INTRAVENOUS
  Administered 2013-01-27: 148 ug via INTRAVENOUS
  Administered 2013-01-27: 03:00:00 via INTRAVENOUS
  Filled 2013-01-26 (×2): qty 50

## 2013-01-26 MED ORDER — DIPHENHYDRAMINE HCL 12.5 MG/5ML PO ELIX
12.5000 mg | ORAL_SOLUTION | Freq: Four times a day (QID) | ORAL | Status: DC | PRN
  Filled 2013-01-26: qty 5

## 2013-01-26 MED ORDER — POLYVINYL ALCOHOL 1.4 % OP SOLN
1.0000 [drp] | Freq: Every day | OPHTHALMIC | Status: DC
  Administered 2013-01-29: 1 [drp] via OPHTHALMIC
  Filled 2013-01-26: qty 15

## 2013-01-26 MED ORDER — LACTATED RINGERS IV SOLN
INTRAVENOUS | Status: DC | PRN
  Administered 2013-01-26: 10:00:00 via INTRAVENOUS

## 2013-01-26 MED ORDER — ONDANSETRON HCL 4 MG/2ML IJ SOLN
INTRAMUSCULAR | Status: DC | PRN
  Administered 2013-01-26: 4 mg via INTRAMUSCULAR

## 2013-01-26 MED ORDER — B COMPLEX PO TABS: 1.0000 | ORAL_TABLET | ORAL | Status: DC

## 2013-01-26 MED ORDER — PANTOPRAZOLE SODIUM 40 MG PO TBEC
40.0000 mg | DELAYED_RELEASE_TABLET | Freq: Every day | ORAL | Status: DC
  Administered 2013-01-27 – 2013-02-03 (×8): 40 mg via ORAL
  Filled 2013-01-26 (×8): qty 1

## 2013-01-26 MED ORDER — CEFUROXIME SODIUM 1.5 G IJ SOLR
1.5000 g | Freq: Two times a day (BID) | INTRAMUSCULAR | Status: AC
  Administered 2013-01-26 – 2013-01-27 (×2): 1.5 g via INTRAVENOUS
  Filled 2013-01-26 (×3): qty 1.5

## 2013-01-26 MED ORDER — PHENYLEPHRINE HCL 10 MG/ML IJ SOLN
30.0000 ug/min | INTRAVENOUS | Status: DC
  Administered 2013-01-26 (×2): 13.333 ug/min via INTRAVENOUS
  Filled 2013-01-26 (×2): qty 1

## 2013-01-26 MED ORDER — ALBUMIN HUMAN 5 % IV SOLN
12.5000 g | Freq: Once | INTRAVENOUS | Status: AC
  Administered 2013-01-26: 12.5 g via INTRAVENOUS

## 2013-01-26 MED ORDER — ONDANSETRON HCL 4 MG/2ML IJ SOLN
4.0000 mg | Freq: Four times a day (QID) | INTRAMUSCULAR | Status: DC | PRN
Start: 2013-01-26 — End: 2013-01-26

## 2013-01-26 MED ORDER — MECLIZINE HCL 25 MG PO TABS
25.0000 mg | ORAL_TABLET | Freq: Three times a day (TID) | ORAL | Status: DC | PRN
  Filled 2013-01-26: qty 1

## 2013-01-26 MED ORDER — DEXTROSE-NACL 5-0.45 % IV SOLN
INTRAVENOUS | Status: DC
  Administered 2013-01-26 – 2013-01-27 (×2): via INTRAVENOUS

## 2013-01-26 SURGICAL SUPPLY — 77 items
ADH SKN CLS APL DERMABOND .7 (GAUZE/BANDAGES/DRESSINGS) ×2
APL SKNCLS STERI-STRIP NONHPOA (GAUZE/BANDAGES/DRESSINGS) ×2
BAG SPEC RTRVL LRG 6X4 10 (ENDOMECHANICALS)
BENZOIN TINCTURE PRP APPL 2/3 (GAUZE/BANDAGES/DRESSINGS) ×3 IMPLANT
CANISTER SUCTION 2500CC (MISCELLANEOUS) ×6 IMPLANT
CATH KIT ON Q 5IN SLV (PAIN MANAGEMENT) IMPLANT
CATH THORACIC 28FR (CATHETERS) ×2 IMPLANT
CATH THORACIC 36FR (CATHETERS) IMPLANT
CATH THORACIC 36FR RT ANG (CATHETERS) IMPLANT
CLIP TI MEDIUM 6 (CLIP) ×3 IMPLANT
CONN 3/8X1/2 ST GISH (MISCELLANEOUS) ×2 IMPLANT
CONT SPEC 4OZ CLIKSEAL STRL BL (MISCELLANEOUS) ×26 IMPLANT
DERMABOND ADVANCED (GAUZE/BANDAGES/DRESSINGS) ×1
DERMABOND ADVANCED .7 DNX12 (GAUZE/BANDAGES/DRESSINGS) ×1 IMPLANT
DRAIN CHANNEL 28F RND 3/8 FF (WOUND CARE) IMPLANT
DRAIN CHANNEL 32F RND 10.7 FF (WOUND CARE) ×2 IMPLANT
DRAPE LAPAROSCOPIC ABDOMINAL (DRAPES) ×3 IMPLANT
DRAPE WARM FLUID 44X44 (DRAPE) ×3 IMPLANT
ELECT REM PT RETURN 9FT ADLT (ELECTROSURGICAL) ×3
ELECTRODE REM PT RTRN 9FT ADLT (ELECTROSURGICAL) ×2 IMPLANT
GLOVE BIOGEL M 6.5 STRL (GLOVE) ×4 IMPLANT
GLOVE BIOGEL PI IND STRL 6.5 (GLOVE) ×2 IMPLANT
GLOVE BIOGEL PI IND STRL 7.0 (GLOVE) ×1 IMPLANT
GLOVE BIOGEL PI INDICATOR 6.5 (GLOVE) ×2
GLOVE BIOGEL PI INDICATOR 7.0 (GLOVE) ×1
GLOVE SURG SIGNA 7.5 PF LTX (GLOVE) ×6 IMPLANT
GOWN PREVENTION PLUS XLARGE (GOWN DISPOSABLE) ×6 IMPLANT
GOWN STRL NON-REIN LRG LVL3 (GOWN DISPOSABLE) ×6 IMPLANT
HANDLE UNIV GIA SHORT (MISCELLANEOUS) ×2 IMPLANT
HEMOSTAT SURGICEL 2X14 (HEMOSTASIS) ×2 IMPLANT
KIT BASIN OR (CUSTOM PROCEDURE TRAY) ×3 IMPLANT
KIT ROOM TURNOVER OR (KITS) ×3 IMPLANT
KIT SUCTION CATH 14FR (SUCTIONS) ×3 IMPLANT
NS IRRIG 1000ML POUR BTL (IV SOLUTION) ×6 IMPLANT
PACK CHEST (CUSTOM PROCEDURE TRAY) ×3 IMPLANT
PAD ARMBOARD 7.5X6 YLW CONV (MISCELLANEOUS) ×6 IMPLANT
POUCH ENDO CATCH II 15MM (MISCELLANEOUS) ×2 IMPLANT
POUCH SPECIMEN RETRIEVAL 10MM (ENDOMECHANICALS) IMPLANT
RELOAD EGIA 45 MED/THCK PURPLE (STAPLE) ×14 IMPLANT
RELOAD EGIA TRIS TAN 45 CVD (STAPLE) ×3 IMPLANT
RELOAD ENDO GIA 30 3.5 (STAPLE) ×4 IMPLANT
RELOAD STAPLE 45 TAN MED CVD (STAPLE) IMPLANT
SEALANT PROGEL (MISCELLANEOUS) IMPLANT
SEALANT SURG COSEAL 4ML (VASCULAR PRODUCTS) IMPLANT
SEALANT SURG COSEAL 8ML (VASCULAR PRODUCTS) IMPLANT
SOLUTION ANTI FOG 6CC (MISCELLANEOUS) ×3 IMPLANT
SPECIMEN JAR MEDIUM (MISCELLANEOUS) ×3 IMPLANT
SPONGE GAUZE 4X4 12PLY (GAUZE/BANDAGES/DRESSINGS) ×3 IMPLANT
SUT PROLENE 4 0 RB 1 (SUTURE)
SUT PROLENE 4-0 RB1 .5 CRCL 36 (SUTURE) IMPLANT
SUT SILK  1 MH (SUTURE) ×1
SUT SILK 1 MH (SUTURE) ×2 IMPLANT
SUT SILK 2 0SH CR/8 30 (SUTURE) IMPLANT
SUT SILK 3 0SH CR/8 30 (SUTURE) IMPLANT
SUT VIC AB 0 CTX 27 (SUTURE) IMPLANT
SUT VIC AB 1 CTX 27 (SUTURE) ×2 IMPLANT
SUT VIC AB 2-0 CT1 27 (SUTURE) ×3
SUT VIC AB 2-0 CT1 36 (SUTURE) ×2 IMPLANT
SUT VIC AB 2-0 CT1 TAPERPNT 27 (SUTURE) ×1 IMPLANT
SUT VIC AB 2-0 CTX 36 (SUTURE) IMPLANT
SUT VIC AB 3-0 MH 27 (SUTURE) IMPLANT
SUT VIC AB 3-0 SH 27 (SUTURE) ×3
SUT VIC AB 3-0 SH 27X BRD (SUTURE) ×1 IMPLANT
SUT VIC AB 3-0 X1 27 (SUTURE) ×6 IMPLANT
SUT VICRYL 0 UR6 27IN ABS (SUTURE) ×6 IMPLANT
SUT VICRYL 2 TP 1 (SUTURE) ×2 IMPLANT
SWAB COLLECTION DEVICE MRSA (MISCELLANEOUS) IMPLANT
SYSTEM SAHARA CHEST DRAIN ATS (WOUND CARE) ×3 IMPLANT
TAPE CLOTH SURG 4X10 WHT LF (GAUZE/BANDAGES/DRESSINGS) ×2 IMPLANT
TIP APPLICATOR SPRAY EXTEND 16 (VASCULAR PRODUCTS) IMPLANT
TOWEL OR 17X24 6PK STRL BLUE (TOWEL DISPOSABLE) ×3 IMPLANT
TOWEL OR 17X26 10 PK STRL BLUE (TOWEL DISPOSABLE) ×6 IMPLANT
TRAP SPECIMEN MUCOUS 40CC (MISCELLANEOUS) IMPLANT
TRAY FOLEY CATH 14FRSI W/METER (CATHETERS) ×3 IMPLANT
TUBE ANAEROBIC SPECIMEN COL (MISCELLANEOUS) IMPLANT
TUNNELER SHEATH ON-Q 11GX8 DSP (PAIN MANAGEMENT) IMPLANT
WATER STERILE IRR 1000ML POUR (IV SOLUTION) ×6 IMPLANT

## 2013-01-26 NOTE — Interval H&P Note (Signed)
History and Physical Interval Note:  01/26/2013 9:22 AM  [name]  has presented today for surgery, with the diagnosis of RML & RLL LUNG NODULES  The various methods of treatment have been discussed with the patient and family. After consideration of risks, benefits and other options for treatment, the patient has consented to  Procedure(s) with comments: VIDEO ASSISTED THORACOSCOPY (VATS)/WEDGE RESECTION (Right) - RT VATS RML WEDGE SEGMENTECTOMY (Right) - RLL  as a surgical intervention .  The patient's history has been reviewed, patient examined, no change in status, stable for surgery.  I have reviewed the patient's chart and labs.  Questions were answered to the patient's satisfaction.     Bryon Parker C

## 2013-01-26 NOTE — Transfer of Care (Signed)
Immediate Anesthesia Transfer of Care Note  Patient: [name]  Procedure(s) Performed: Procedure(s): VIDEO ASSISTED THORACOSCOPY (VATS) with wedge resection right middle lobe nodule , segmental resection rigth lower lobe lung, lymph node sampling, (Right)  Patient Location: PACU  Anesthesia Type:General  Level of Consciousness: sedated  Airway & Oxygen Therapy: Patient Spontanous Breathing and Patient connected to nasal cannula oxygen  Post-op Assessment: Report given to PACU RN, Post -op Vital signs reviewed and stable and Patient moving all extremities  Post vital signs: Reviewed and stable  Complications: No apparent anesthesia complications

## 2013-01-26 NOTE — Anesthesia Preprocedure Evaluation (Addendum)
Anesthesia Evaluation  Patient identified by MRN, date of birth, ID band Patient awake    Reviewed: Allergy & Precautions, H&P , NPO status , Patient's Chart, lab work & pertinent test results  Airway Mallampati: II TM Distance: >3 FB Neck ROM: Full    Dental  (+) Teeth Intact and Dental Advisory Given   Pulmonary  breath sounds clear to auscultation        Cardiovascular Rhythm:Regular Rate:Normal     Neuro/Psych    GI/Hepatic   Endo/Other    Renal/GU      Musculoskeletal   Abdominal   Peds  Hematology   Anesthesia Other Findings   Reproductive/Obstetrics                          Anesthesia Physical Anesthesia Plan  ASA: III  Anesthesia Plan: General   Post-op Pain Management:    Induction: Intravenous  Airway Management Planned: Double Lumen EBT  Additional Equipment: Arterial line, CVP and Ultrasound Guidance Line Placement  Intra-op Plan:   Post-operative Plan: Extubation in OR  Informed Consent: I have reviewed the patients History and Physical, chart, labs and discussed the procedure including the risks, benefits and alternatives for the proposed anesthesia with the patient or authorized representative who has indicated his/her understanding and acceptance.   Dental advisory given  Plan Discussed with: CRNA, Anesthesiologist and Surgeon  Anesthesia Plan Comments:         Anesthesia Quick Evaluation

## 2013-01-26 NOTE — Brief Op Note (Addendum)
01/26/2013  12:43 PM  PATIENT:  [name]  72 y.o. female  PRE-OPERATIVE DIAGNOSIS:  RML & RLL LUNG NODULES  POST-OPERATIVE DIAGNOSIS:  RML & RLL LUNG NODULES  PROCEDURE:  Procedure(s):  VIDEO ASSISTED THORACOSCOPY (VATS)  -Wedge Resection RML -Removal of Lymph Node of RML -Superior Segmentectomy RLL -Lymph Node Resection  SURGEON:  Surgeon(s) and Role:    * Loreli Slot, MD - Primary  PHYSICIAN ASSISTANT: Lowella Dandy PA-C  ANESTHESIA:   general  EBL:  Total I/O In: -  Out: 200 [Urine:125; Blood:75]  BLOOD ADMINISTERED:none  DRAINS: Right Pleural Chest tube x 2   LOCAL MEDICATIONS USED:  NONE  SPECIMEN:  Right Middle Lobe Wedge Resection, Right Superior Segment Right Lower Lobe, Lymph Nodes from stations 4,7,10,11,12  DISPOSITION OF SPECIMEN:  PATHOLOGY  COUNTS:  YES  PLAN OF CARE: Admit to inpatient   PATIENT DISPOSITION:  PACU - hemodynamically stable.   Delay start of Pharmacological VTE agent (>24hrs) due to surgical blood loss or risk of bleeding: yes  FINDINGS: RML nodule- 2 lymph nodes in close proximity, both removed RLL - well differentiated adenocarcinoma, margins clear

## 2013-01-26 NOTE — H&P (View-Only) (Signed)
PCP is BARNES,ELIZABETH STEWART, MD Referring Provider is Barnes, Elizabeth Stewa*  Chief Complaint  Patient presents with  . Lung Lesion    RML and RLL...eval and treat...CT CHEST    HPI: 72-year-old woman who presents with chief complaint of enlarging lung nodule.  [name] is a 72-year-old woman with a 40-pack-year history of smoking who quit in 2007. She has been followed for about 3-1/2 years now for a groundglass opacity in the superior segment of her right lower lobe. Initially this really did not show any significant change. On her CT in 2013 it does appear that it was becoming a little more prominent. She had a repeat CT done October 7 showed a definite increase in size and now possibly a small soft tissue component. There also is a 5 mm subpleural nodule in the middle lobe that has not changed over time.  She smoked up until 2007 was finally able to quit at that time. She has not had any history of cardiac disease. She denies chest pain or shortness of breath. She does have an occasional cough which is been nonproductive. She denies hemoptysis. She has gained the weight recently. She also has had some decreased energy and cold intolerance. She's not had any history of excessive bleeding or bruising. She did have a laser vein ablation in her left thigh recently and has some bruising from that.   Past Medical History  Diagnosis Date  . Hyperplastic polyps of stomach   . Osteoporosis   . H/O vitamin D deficiency   . Hypercholesteremia   . Joint pain     No past surgical history on file.  Family History  Problem Relation Age of Onset  . Lung cancer Father   . Cancer - Other Brother   . Diabetes Mellitus II Father   . Hypertension Father   . Hyperlipidemia Father     Social History History  Substance Use Topics  . Smoking status: Former Smoker -- 1.00 packs/day for 40 years    Types: Cigarettes    Quit date: 04/16/2005  . Smokeless tobacco: Never Used  . Alcohol Use:  No    Current Outpatient Prescriptions  Medication Sig Dispense Refill  . ALPRAZolam (XANAX) 0.5 MG tablet Take 0.25 mg by mouth 3 (three) times daily as needed for anxiety. Takes 1-2 tablets at a time      . atorvastatin (LIPITOR) 20 MG tablet Take 20 mg by mouth daily.       . FLUZONE HIGH-DOSE injection       . meclizine (ANTIVERT) 25 MG tablet Take 25 mg by mouth 3 (three) times daily as needed.       . meloxicam (MOBIC) 15 MG tablet 15 mg daily as needed.       . raloxifene (EVISTA) 60 MG tablet Take 60 mg by mouth every other day.        No current facility-administered medications for this visit.    Allergies  Allergen Reactions  . Erythromycin Other (See Comments)    Upset stomach    Review of Systems  Constitutional: Positive for fatigue and unexpected weight change (weight gain). Negative for fever, chills, activity change and appetite change.  Eyes:       Glasses, cataracts  Respiratory: Positive for cough (occasional). Negative for shortness of breath, wheezing and stridor.   Cardiovascular: Positive for leg swelling. Negative for chest pain.       Varicose veins  Musculoskeletal: Positive for joint swelling and myalgias.    Hematological: Negative for adenopathy. Does not bruise/bleed easily.  Psychiatric/Behavioral: The patient is nervous/anxious.   All other systems reviewed and are negative.    BP 137/68  Pulse 90  Resp 16  Ht 5' 8" (1.727 m)  Wt 148 lb (67.132 kg)  BMI 22.51 kg/m2 Physical Exam  Vitals reviewed. Constitutional: She is oriented to person, place, and time. She appears well-developed and well-nourished. No distress.  HENT:  Head: Normocephalic and atraumatic.  Eyes: EOM are normal. Pupils are equal, round, and reactive to light.  Neck: Neck supple. No thyromegaly present.  Cardiovascular: Normal rate, regular rhythm and intact distal pulses.  Exam reveals no gallop and no friction rub.   No murmur heard. Pulmonary/Chest: Effort normal and  breath sounds normal. She has no wheezes. She has no rales.  Abdominal: Soft. There is no tenderness.  Musculoskeletal: She exhibits no edema.  ecchymosis left thigh from recent laser vein ablation  Lymphadenopathy:    She has no cervical adenopathy.  Neurological: She is alert and oriented to person, place, and time. No cranial nerve deficit.  No focal motor deficit  Skin: Skin is warm and dry.     Diagnostic Tests: CT chest 01/20/2013 CLINICAL DATA: Followup of pulmonary nodule  EXAM:  CT CHEST WITHOUT CONTRAST  TECHNIQUE:  Multidetector CT imaging of the chest was performed following the  standard protocol without IV contrast.  COMPARISON: CT chest of 01/30/2012  FINDINGS:  Diffuse changes of centrilobular emphysema are again noted. The sub  solid ground-glass opacity within the superior segment of the right  lower lobe posteriorly has definitely increased in size.  Particularly when compared to the CT from September 2012, this sub  solid ground-glass opacities now measures 2.3 x 2.5 centimeter on  coronal images compared to approximately 1.3 x 1.4 centimeter. A  slow growing adenocarcinoma is therefore a definite consideration  and thoracic surgery consultation is recommended. The pleural-based  nodule within the right middle lobe is relatively stable measuring 5  millimeters in diameter. No new pulmonary nodule is seen. No pleural  effusion is noted. The central airway is patent.  On soft tissue window images, the thyroid gland is small and  unremarkable. On this unenhanced study, no mediastinal or hilar  adenopathy is seen. Calcified mediastinal nodes are present  presumably due to prior granulomatous disease. Coronary artery  calcifications also are noted and there is mild cardiomegaly  present. The portion of the upper abdomen that is visualized is  unremarkable other than a few calcified splenic granulomas. Minimal  compression of T8 vertebral body is noted which may  be increased  slightly in the interval.  IMPRESSION:  1. Increase in size of the sub solid ground-glass opacity within the  superior segment of the right lower lobe, concerning for slow  growing adenocarcinoma. Recommend thoracic surgery consultation.  2. Diffuse centrilobular emphysema.  3. Stable pleural-based 5 mm nodule within the right middle lobe.  4. Mild compression deformity of T8.  Electronically Signed  By: Paul Barry M.D.  On: 01/20/2013 11:20  Impression: 72-year-old woman with an enlarging groundglass opacity in the superior segment of her right lower lobe. As was relatively stable in size for a couple of years but over the past year has shown definite growth. 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 Given that it has increased in size I do recommend that we resect the lesion. I do not think a PET will be helpful in this case given the   groundglass appearance as it most likely will not be PET avid.  She also has a small subpleural nodule in the middle lobe which has not changed. The given that we will be in the chest I recommended we go ahead and perform a wedge resection on that at the same time as it will not cause any significant change in operative time or risk.  We did discuss other options such as continued observation, which would not be recommended, and  biopsy either bronchoscopically or percutaneously, neither of which would definitively rule out low grade adenocarcinoma if they were negative.  I have discussed with [name] the nature of the procedure, the need for general anesthesia, and the incisions to be used. We discussed the expected hospital stay, overall recovery and short and long term outcomes. She understands the risks include, but are not limited to, death, stroke, MI, DVT/PE, bleeding, possible need for transfusion, infe cardiac arrhythmias, prolonged air leaks, as well as the possibility of unforeseeable  complications.  She does need PFTs prior surgery, but I don't think to be any issues with pulmonary reserve given the limited resection involved.  She understands and accepts the risks and agrees to proceed.  Plan: Pulmonary function tests with room air blood gas  Right VATS, lower lobe segmentectomy, wedge resection of middle lobe on Monday October 13. 

## 2013-01-26 NOTE — Preoperative (Signed)
Beta Blockers   Reason not to administer Beta Blockers:Not Applicable 

## 2013-01-26 NOTE — Anesthesia Postprocedure Evaluation (Signed)
  Anesthesia Post-op Note  Patient: [name]  Procedure(s) Performed: Procedure(s): VIDEO ASSISTED THORACOSCOPY (VATS) with wedge resection right middle lobe nodule , segmental resection rigth lower lobe lung, lymph node sampling, (Right)  Patient Location: PACU  Anesthesia Type:General  Level of Consciousness: awake, alert  and oriented  Airway and Oxygen Therapy: Patient Spontanous Breathing and Patient connected to nasal cannula oxygen  Post-op Pain: mild  Post-op Assessment: Post-op Vital signs reviewed, Patient's Cardiovascular Status Stable, Patent Airway and Pain level controlled  Post-op Vital Signs: stable  Complications: No apparent anesthesia complications

## 2013-01-26 NOTE — Anesthesia Procedure Notes (Signed)
Procedure Name: Intubation Date/Time: 01/26/2013 10:20 AM Performed by: Charm Barges, Apollonia Amini R Pre-anesthesia Checklist: Patient identified, Emergency Drugs available, Suction available, Patient being monitored and Timeout performed Patient Re-evaluated:Patient Re-evaluated prior to inductionOxygen Delivery Method: Circle system utilized Preoxygenation: Pre-oxygenation with 100% oxygen Intubation Type: IV induction Ventilation: Mask ventilation without difficulty Laryngoscope Size: Mac and 3 Grade View: Grade III Endobronchial tube: Left, Double lumen EBT, EBT position confirmed by auscultation and EBT position confirmed by fiberoptic bronchoscope and 37 Fr Number of attempts: 3 (Unable to pass EBT on 1st laryngoscopy, 7.5 mm OETT placed on 2nd laryngoscopy, EBT inserted 3rd laryngoscopy over exchange catheter. ) Airway Equipment and Method: Stylet,  Fiberoptic brochoscope and Bougie stylet Placement Confirmation: ETT inserted through vocal cords under direct vision,  positive ETCO2 and breath sounds checked- equal and bilateral Secured at: 31 cm Tube secured with: Tape Dental Injury: Teeth and Oropharynx as per pre-operative assessment  Difficulty Due To: Difficulty was unanticipated and Difficult Airway- due to anterior larynx

## 2013-01-27 ENCOUNTER — Inpatient Hospital Stay (HOSPITAL_COMMUNITY): Payer: Medicare Other

## 2013-01-27 ENCOUNTER — Encounter (HOSPITAL_COMMUNITY): Payer: Self-pay

## 2013-01-27 DIAGNOSIS — J9383 Other pneumothorax: Secondary | ICD-10-CM | POA: Diagnosis not present

## 2013-01-27 LAB — BASIC METABOLIC PANEL
BUN: 8 mg/dL (ref 6–23)
CO2: 26 mEq/L (ref 19–32)
CO2: 25 mEq/L (ref 19–32)
Calcium: 7.9 mg/dL — ABNORMAL LOW (ref 8.4–10.5)
Calcium: 8.1 mg/dL — ABNORMAL LOW (ref 8.4–10.5)
Creatinine, Ser: 0.66 mg/dL (ref 0.50–1.10)
Creatinine, Ser: 0.73 mg/dL (ref 0.50–1.10)
GFR calc non Af Amer: 87 mL/min — ABNORMAL LOW (ref >90)
GFR calc non Af Amer: 84 mL/min — ABNORMAL LOW (ref >90)
Glucose, Bld: 183 mg/dL — ABNORMAL HIGH (ref 70–99)
Glucose, Bld: 124 mg/dL — ABNORMAL HIGH (ref 70–99)
Potassium: 3.8 mEq/L (ref 3.5–5.1)

## 2013-01-27 LAB — CBC
HCT: 34 % — ABNORMAL LOW (ref 36.0–46.0)
MCV: 91.2 fL (ref 78.0–100.0)
Platelets: 252 10*3/uL (ref 150–400)
RBC: 3.73 MIL/uL — ABNORMAL LOW (ref 3.87–5.11)
RDW: 12.9 % (ref 11.5–15.5)
WBC: 10.5 10*3/uL (ref 4.0–10.5)

## 2013-01-27 LAB — BLOOD GAS, ARTERIAL
Acid-Base Excess: 0.3 mmol/L (ref 0.0–2.0)
Drawn by: 39899
O2 Content: 2 L/min
Patient temperature: 98.2
TCO2: 25.9 mmol/L (ref 0–100)
pH, Arterial: 7.4 (ref 7.350–7.450)
pO2, Arterial: 69.6 mmHg — ABNORMAL LOW (ref 80.0–100.0)

## 2013-01-27 MED ORDER — NALOXONE HCL 0.4 MG/ML IJ SOLN: 0.4000 mg | INTRAMUSCULAR | Status: DC | PRN

## 2013-01-27 MED ORDER — POLYVINYL ALCOHOL 1.4 % OP SOLN
1.0000 [drp] | Freq: Every day | OPHTHALMIC | Status: DC
  Administered 2013-01-27 – 2013-02-02 (×6): 1 [drp] via OPHTHALMIC
  Filled 2013-01-27: qty 15

## 2013-01-27 MED ORDER — RISAQUAD PO CAPS
1.0000 | ORAL_CAPSULE | Freq: Every day | ORAL | Status: DC
  Administered 2013-01-27 – 2013-01-30 (×3): 1 via ORAL
  Filled 2013-01-27 (×4): qty 1

## 2013-01-27 MED ORDER — DIPHENHYDRAMINE HCL 50 MG/ML IJ SOLN: 12.5000 mg | Freq: Four times a day (QID) | INTRAMUSCULAR | Status: DC | PRN

## 2013-01-27 MED ORDER — ONDANSETRON HCL 4 MG/2ML IJ SOLN: 4.0000 mg | Freq: Four times a day (QID) | INTRAMUSCULAR | Status: DC | PRN

## 2013-01-27 MED ORDER — ENOXAPARIN SODIUM 40 MG/0.4ML ~~LOC~~ SOLN
40.0000 mg | Freq: Every day | SUBCUTANEOUS | Status: DC
  Administered 2013-01-27 – 2013-02-03 (×8): 40 mg via SUBCUTANEOUS
  Filled 2013-01-27 (×8): qty 0.4

## 2013-01-27 MED ORDER — MELOXICAM 15 MG PO TABS
15.0000 mg | ORAL_TABLET | Freq: Every day | ORAL | Status: DC | PRN
  Filled 2013-01-27: qty 1

## 2013-01-27 MED ORDER — OXYCODONE-ACETAMINOPHEN 5-325 MG PO TABS
1.0000 | ORAL_TABLET | ORAL | Status: DC | PRN
  Administered 2013-01-29 – 2013-02-01 (×10): 2 via ORAL
  Filled 2013-01-27 (×10): qty 2

## 2013-01-27 MED ORDER — OXYCODONE HCL 5 MG PO TABS
10.0000 mg | ORAL_TABLET | ORAL | Status: DC | PRN
  Administered 2013-01-27 – 2013-02-03 (×14): 10 mg via ORAL
  Filled 2013-01-27 (×14): qty 2

## 2013-01-27 MED ORDER — FENTANYL 10 MCG/ML IV SOLN
INTRAVENOUS | Status: DC
  Administered 2013-01-27: 45 ug via INTRAVENOUS
  Administered 2013-01-27: 105 ug via INTRAVENOUS
  Administered 2013-01-27: 250 ug via INTRAVENOUS
  Administered 2013-01-27: 135 ug via INTRAVENOUS
  Administered 2013-01-28: 60 ug via INTRAVENOUS
  Administered 2013-01-28: 21:00:00 via INTRAVENOUS
  Administered 2013-01-28: 15 ug via INTRAVENOUS
  Administered 2013-01-28: 2 ug via INTRAVENOUS
  Administered 2013-01-28: 90 ug via INTRAVENOUS
  Administered 2013-01-28: 60 ug via INTRAVENOUS
  Administered 2013-01-28: 102.9 ug via INTRAVENOUS
  Administered 2013-01-29: 45 ug via INTRAVENOUS
  Administered 2013-01-29: 60 ug via INTRAVENOUS
  Administered 2013-01-29: 75 ug via INTRAVENOUS
  Administered 2013-01-29: 45 ug via INTRAVENOUS
  Administered 2013-01-29: 60 ug via INTRAVENOUS
  Administered 2013-01-29: 45 ug via INTRAVENOUS
  Administered 2013-01-29: 86.13 ug via INTRAVENOUS
  Administered 2013-01-30: 45 ug via INTRAVENOUS
  Administered 2013-01-30: 19:00:00 via INTRAVENOUS
  Administered 2013-01-30: 12.55 ug via INTRAVENOUS
  Administered 2013-01-30: 15 ug via INTRAVENOUS
  Administered 2013-01-30 – 2013-01-31 (×2): 30 ug via INTRAVENOUS
  Administered 2013-01-31: 0.1 ug via INTRAVENOUS
  Administered 2013-01-31: 15 ug via INTRAVENOUS
  Administered 2013-01-31: 0.1 ug via INTRAVENOUS
  Administered 2013-02-01: 15 ug via INTRAVENOUS
  Administered 2013-02-01: 30 ug via INTRAVENOUS
  Administered 2013-02-01: 60 ug via INTRAVENOUS
  Administered 2013-02-01: 15 ug via INTRAVENOUS
  Administered 2013-02-01: 45 ug via INTRAVENOUS
  Administered 2013-02-02: 0.1 ug via INTRAVENOUS
  Administered 2013-02-02: 60 ug via INTRAVENOUS
  Administered 2013-02-02: 15 ug via INTRAVENOUS
  Administered 2013-02-02: 30 ug via INTRAVENOUS
  Administered 2013-02-02 (×2): 15 ug via INTRAVENOUS
  Administered 2013-02-03: 45 ug via INTRAVENOUS
  Administered 2013-02-03: 30 ug via INTRAVENOUS
  Filled 2013-01-27 (×3): qty 50

## 2013-01-27 MED ORDER — LEVALBUTEROL HCL 0.63 MG/3ML IN NEBU
0.6300 mg | INHALATION_SOLUTION | Freq: Three times a day (TID) | RESPIRATORY_TRACT | Status: DC
  Administered 2013-01-27 – 2013-01-28 (×4): 0.63 mg via RESPIRATORY_TRACT
  Filled 2013-01-27 (×10): qty 3

## 2013-01-27 MED ORDER — SODIUM CHLORIDE 0.9 % IJ SOLN: 9.0000 mL | INTRAMUSCULAR | Status: DC | PRN

## 2013-01-27 MED ORDER — KETOROLAC TROMETHAMINE 15 MG/ML IJ SOLN
15.0000 mg | Freq: Four times a day (QID) | INTRAMUSCULAR | Status: AC
  Administered 2013-01-27 (×4): 15 mg via INTRAVENOUS
  Filled 2013-01-27 (×4): qty 1

## 2013-01-27 MED ORDER — OXYCODONE-ACETAMINOPHEN 5-325 MG PO TABS: 1.0000 | ORAL_TABLET | ORAL | Status: DC | PRN

## 2013-01-27 MED ORDER — DIPHENHYDRAMINE HCL 12.5 MG/5ML PO ELIX
12.5000 mg | ORAL_SOLUTION | Freq: Four times a day (QID) | ORAL | Status: DC | PRN
  Administered 2013-02-01: 12.5 mg via ORAL
  Filled 2013-01-27: qty 5

## 2013-01-27 MED ORDER — PROBIOTIC DAILY PO CAPS: 1.0000 | ORAL_CAPSULE | Freq: Every day | ORAL | Status: DC

## 2013-01-27 MED ORDER — HYPROMELLOSE (GONIOSCOPIC) 2.5 % OP SOLN: 1.0000 [drp] | Freq: Every day | OPHTHALMIC | Status: DC

## 2013-01-27 MED ORDER — OXYCODONE HCL 5 MG PO TABS
5.0000 mg | ORAL_TABLET | ORAL | Status: DC | PRN
  Filled 2013-01-27: qty 2

## 2013-01-27 MED ORDER — LEVALBUTEROL HCL 0.63 MG/3ML IN NEBU: 0.6300 mg | INHALATION_SOLUTION | RESPIRATORY_TRACT | Status: DC | PRN

## 2013-01-27 MED ORDER — SODIUM CHLORIDE 0.9 % IV SOLN
INTRAVENOUS | Status: DC
  Administered 2013-01-27 – 2013-01-31 (×2): via INTRAVENOUS

## 2013-01-27 NOTE — Op Note (Signed)
[name], [name] NO.:  [id number]  MEDICAL RECORD NO.:  [id number]  LOCATION:  3S10C                        FACILITY:  [facility]  PHYSICIAN:  Salvatore Decent. Dorris Fetch, M.D.DATE OF BIRTH:  January 12, 1941  DATE OF PROCEDURE:  01/26/2013 DATE OF DISCHARGE:                              OPERATIVE REPORT   PREOPERATIVE DIAGNOSIS:  Enlarging ground-glass opacity right lower lobe, right middle lobe nodule.  POSTOPERATIVE DIAGNOSIS:  Well-differentiated adenocarcinoma right lower lobe, subpleural lymph nodes in right middle lobe.  PROCEDURE:  Right video-assisted thoracoscopy.  Wedge resection of right middle lobe nodule.  Right lower lobe superior segmentectomy with mediastinal lymph node dissection.  SURGEON:  Salvatore Decent. Dorris Fetch, M.D.  ASSISTANT:  Lowella Dandy, PA-C.  ANESTHESIA:  General.  FINDINGS:  Two small subpleural lymph nodes in close proximity on the right middle lobe, both removed.  These were benign appearing.  Superior segmentectomy of right lower lobe showed a well-differentiated adenocarcinoma with clear margins, multiple benign-appearing mediastinal nodes taken.  CLINICAL NOTE:  [name] is a 72 year old woman with a history of smoking, she quit in 2007.  She was found to have a ground-glass opacity in the superior segment of right lower lobe several years ago.  This has been followed over time, but did not showing any significant change. Recently, she had a repeat CT, which showed a definite increase in size and raised the possibility of a small soft tissue component.  There also was a 5-mm subpleural nodule in the middle lobe that did not change over time, but was suspected be a pulmonary lymph node.  The patient was advised to undergo resection of the right lower lobe mass.  This was felt to require segmentectomy or possibly a lobectomy, and also was advised to have the middle lobe nodule removed at the same time.  The indications, risks,  benefits and alternatives were discussed in detail with the patient.  She understood and accepted the risks and agreed to proceed.  OPERATIVE NOTE:  [name] was brought to the preoperative holding area on January 26, 2013.  There, Anesthesia placed a central venous line and an arterial blood pressure monitoring line.  She was taken to the operating room, anesthetized, and intubated.  Intravenous antibiotics were administered.  A Foley catheter was placed.  Sequential compression stockings were placed on the legs for DVT prophylaxis.  She was placed in a left lateral decubitus position.  Single lung ventilation of the left lung was carried out and was tolerated well throughout the procedure.  The right chest was prepped and draped in the usual sterile fashion.  An incision was made in the seventh intercostal space and then a port was placed and the thoracoscope was placed in the chest, there was good isolation of the right lung.  A small utility incision was made approximately the fifth intercostal space in the anterior axillary line. No rib spreading was performed during the procedure.  First, the right middle lobe was inspected.  There were two lymph nodes in close proximity to each other.  They were visible on the pleural surface of the right middle lobe.  The larger of the two was resected with a  wedge resection with sequential firings of endoscopic stapler. The second node was smaller, but it was impossible to get a stapler on that without potentially leaving a blind pouch of right middle lobe left.  Therefore, the lymph node was dissected off the pleural surface with electrocautery, it was sent along with the other specimen as a single specimen labelled right middle lobe nodules.  Next, attention was turned to the superior segment of the lower lobe. There was a palpable mass, although it was not distinct.  The dissection was begun in the fissure.  The fissure was relatively  incomplete at the confluence of the major and minor fissures. The pulmonary artery was dissected out at that point and there was a node between the basilar and superior segmental branches of the pulmonary artery.  This node was taken and sent as a level 12 node.  Additional level 11 and 12 nodes were taken during this dissection of the fissure.  The superior segmental arterial branch was dissected out and encircled and then divided with endoscopic GIA vascular stapler.  There was good hemostasis at the staple line.  Next, the inferior ligament was divided and the pleural reflection was divided posteriorly up to the hilum.  The major fissure was completed with sequential firings of an endoscopic GIA stapler.  The bronchus was identified. A level 10 node, adjacent to the main bronchus was taken and sent as a separate specimen.  Level 11 and 12 nodes were dissected off the segmental bronchus and likewise sent as a separate specimens.  The superior segmental bronchus was isolated.  The stapler was placed across the bronchus and closed, but not fired.  A test inflation showed good aeration of the upper and middle lobes as well as the basilar segments of the lower lobe.  The stapler was fired transecting the bronchus.  The segmentectomy was completed with sequential firings of endoscopic GIA stapler to create the parenchymal margin preserving the basilar segments.  The specimen was placed into an endoscopic retrieval bag and removed through the utility incision.  It was sent for frozen section of the mass and margins.  While awaiting the results of the frozen section, level 7 nodes were dissected out.  These were mildly enlarged, but otherwise benign-appearing, but did bleed easily and there was some bleeding with the harvest of the nodes. Hemostasis was achieved with cautery and Surgicel was also applied to the area.  Next, the level 4R nodes were dissected out from an anterior approach  dividing the pleural reflection there and then elevating the azygous vein to expose the paratracheal nodes.  These nodes were all small or normal in size and they were sent as a single specimen, labeled 4R nodes.  The frozen section returned with well-differentiated adenocarcinoma with clear margins.  A second port-type incision was made adjacent to the first.  A 28-French chest tube was placed through this incision and secured with a #1 silk suture.  A 32 Blake drain was placed through the original port incisions and secured likewise with #1 silk suture.  The remaining segments of the right lung were reinflated and the scope was withdrawn.  The utility incision was closed with a #1 Vicryl fascial suture followed by 2-0 Vicryl subcutaneous suture and 3-0 Vicryl subcuticular suture.  All sponge, needle, and instrument counts were correct at the end of the procedure.  The patient was taken from the operating room to the postanesthetic care unit in good condition.     Salvatore Decent  Dorris Fetch, M.D.     SCH/MEDQ  D:  01/26/2013  T:  01/27/2013  Job:  [id number]

## 2013-01-27 NOTE — Progress Notes (Addendum)
[facility] DAILY ICU PROGRESS NOTE                   [address]            [place] [address]          [phone]   1 Day Post-Op Procedure(s) (LRB): VIDEO ASSISTED THORACOSCOPY (VATS) with wedge resection right middle lobe nodule , segmental resection rigth lower lobe lung, lymph node sampling, (Right)  Total Length of Stay:  LOS: 1 day   Subjective: Primary c/o is a lot of pain  Objective: Vital signs in last 24 hours: Temp:  [97 F (36.1 C)-98.8 F (37.1 C)] 98 F (36.7 C) (10/14 0726) Pulse Rate:  [56-87] 81 (10/14 0422) Cardiac Rhythm:  [-] Normal sinus rhythm (10/14 0422) Resp:  [10-25] 20 (10/14 0422) BP: (83-162)/(35-102) 132/48 mmHg (10/14 0422) SpO2:  [90 %-100 %] 96 % (10/14 0726) Arterial Line BP: (73-203)/(32-122) 139/122 mmHg (10/14 0422)  There were no vitals filed for this visit.  Weight change:    Hemodynamic parameters for last 24 hours:    Intake/Output from previous day: 10/13 0701 - 10/14 0700 In: 2750 [I.V.:2700; IV Piggyback:50] Out: 950 [Urine:625; Blood:75; Chest Tube:250]  Intake/Output this shift:    Current Meds: Scheduled Meds: . acetaminophen  1,000 mg Oral Q6H   Or  . acetaminophen (TYLENOL) oral liquid 160 mg/5 mL  1,000 mg Oral Q6H  . acidophilus  1 capsule Oral Daily  . atorvastatin  20 mg Oral QHS  . B-complex with vitamin C  1 tablet Oral Daily  . bisacodyl  10 mg Oral Daily  . cefUROXime (ZINACEF)  IV  1.5 g Intravenous Q12H  . cholecalciferol  2,000 Units Oral Daily  . fentaNYL   Intravenous Q4H  . levalbuterol  0.63 mg Nebulization Q6H  . pantoprazole  40 mg Oral Daily  . polyvinyl alcohol  1 drop Both Eyes Daily  . [START ON 01/28/2013] raloxifene  60 mg Oral Q M,W,F,Sa-1800   Continuous Infusions: . dextrose 5 % and 0.45% NaCl 100 mL/hr at 01/27/13 0118  . phenylephrine (NEO-SYNEPHRINE) Adult infusion Stopped (01/26/13 1631)   PRN Meds:.diphenhydrAMINE, diphenhydrAMINE, meclizine, naloxone,  ondansetron (ZOFRAN) IV, oxyCODONE, oxyCODONE-acetaminophen, potassium chloride, sodium chloride  General appearance: alert, cooperative and no distress Heart: regular rate and rhythm Lungs: scattered wheeze Abdomen: soft, nontender, mod dist Extremities: PAS in place Wound: dressings CDI  Lab Results: CBC: Recent Labs  01/27/13 0440  WBC 10.5  HGB 11.5*  HCT 34.0*  PLT 252   BMET:  Recent Labs  01/27/13 0440  NA 138  K 3.8  CL 103  CO2 26  GLUCOSE 183*  BUN 9  CREATININE 0.66  CALCIUM 7.9*    PT/INR: No results found for this basename: LABPROT, INR,  in the last 72 hours Radiology: Dg Chest Port 1 View  01/27/2013   CLINICAL DATA:  Followup pneumothorax  EXAM: PORTABLE CHEST - 1 VIEW  COMPARISON:  Portable chest of 01/26/2013 and CT chest of 01/20/2013  FINDINGS: A tiny right apical pneumothorax remains with 2 right chest tubes present. Bibasilar atelectasis and possible small effusions remain. The right internal jugular central venous line tip overlies the SVC -RA junction and heart size is stable  IMPRESSION: No change in small right apical pneumothorax with 2 right chest tubes remaining.   Electronically Signed   By: Dwyane Dee M.D.   On: 01/27/2013 07:29   Dg Chest Portable 1 View  01/26/2013  CLINICAL DATA:  Status post VATS.  EXAM: PORTABLE CHEST - 1 VIEW  COMPARISON:  01/23/2013.  FINDINGS: Interval right chest tubes. Less than 5% right apical pneumothorax. Interval surgical staples in the inferior right lung and hilar region. Interval right jugular catheter with its tip in the inferior aspect of the superior vena cava. Interval mild airspace opacity at both lung bases and small to moderate-sized left pleural effusion. Diffuse osteopenia.  IMPRESSION: 1. Postsurgical changes, as described above, with a less than 5% right apical pneumothorax. 2. Bibasilar atelectasis/pneumonia/alveolar edema. 3. Small to moderate-sized left pleural effusion. These results will be  called to the ordering clinician or representative by the Radiologist Assistant, and communication documented in the PACS Dashboard.   Electronically Signed   By: Gordan Payment M.D.   On: 01/26/2013 15:13   Chest tube: + air leak, 250 cc sero-sang drainage  Assessment/Plan: S/P Procedure(s) (LRB): VIDEO ASSISTED THORACOSCOPY (VATS) with wedge resection right middle lobe nodule , segmental resection rigth lower lobe lung, lymph node sampling, (Right)  1 pain- will increase to full dose fentanyl pca, give 4 doses of 15 mg toradol q6, creat 0.6 2 cont CT to suction, small stable pntx 3 pulm toilet/xopenex- routine mobilization 4 ABL anemia- mild 5 d/c aline    GOLD,WAYNE E 01/27/2013 7:48 AM  Patient seen and examined, agree with above Pain control is primary issue Has a small air leak- keep CT on suction today

## 2013-01-27 NOTE — Progress Notes (Signed)
Wayne Gold notified urinary output 40 cc's since fluid increased.  Orders given.  Continue to watch.

## 2013-01-27 NOTE — Progress Notes (Signed)
Urinary output 100 cc's this shift.  Wayne Gold notified and orders given to increase fluids.  Continue to watch.

## 2013-01-27 NOTE — Progress Notes (Signed)
Utilization review completed.  

## 2013-01-28 ENCOUNTER — Inpatient Hospital Stay (HOSPITAL_COMMUNITY): Payer: Medicare Other

## 2013-01-28 ENCOUNTER — Encounter (HOSPITAL_COMMUNITY): Payer: Self-pay | Admitting: Thoracic Surgery (Cardiothoracic Vascular Surgery)

## 2013-01-28 DIAGNOSIS — Z4682 Encounter for fitting and adjustment of non-vascular catheter: Secondary | ICD-10-CM | POA: Diagnosis not present

## 2013-01-28 DIAGNOSIS — J9819 Other pulmonary collapse: Secondary | ICD-10-CM | POA: Diagnosis not present

## 2013-01-28 DIAGNOSIS — J9383 Other pneumothorax: Secondary | ICD-10-CM | POA: Diagnosis not present

## 2013-01-28 LAB — COMPREHENSIVE METABOLIC PANEL
AST: 19 U/L (ref 0–37)
Albumin: 2.5 g/dL — ABNORMAL LOW (ref 3.5–5.2)
Alkaline Phosphatase: 49 U/L (ref 39–117)
CO2: 24 mEq/L (ref 19–32)
Calcium: 7.6 mg/dL — ABNORMAL LOW (ref 8.4–10.5)
Chloride: 105 mEq/L (ref 96–112)
Creatinine, Ser: 0.69 mg/dL (ref 0.50–1.10)
GFR calc non Af Amer: 86 mL/min — ABNORMAL LOW (ref >90)
Sodium: 137 mEq/L (ref 135–145)
Total Protein: 5.4 g/dL — ABNORMAL LOW (ref 6.0–8.3)

## 2013-01-28 LAB — CBC
MCH: 30.8 pg (ref 26.0–34.0)
MCHC: 33.8 g/dL (ref 30.0–36.0)
MCV: 91.2 fL (ref 78.0–100.0)
Platelets: 198 10*3/uL (ref 150–400)
RBC: 3.41 MIL/uL — ABNORMAL LOW (ref 3.87–5.11)
RDW: 13.1 % (ref 11.5–15.5)

## 2013-01-28 NOTE — Progress Notes (Addendum)
      [address]       [place] [address]             [phone]      2 Days Post-Op Procedure(s) (LRB): VIDEO ASSISTED THORACOSCOPY (VATS) with wedge resection right middle lobe nodule , segmental resection rigth lower lobe lung, lymph node sampling, (Right)  Subjective:  [name] complains of soreness this morning.  The pain medication provides relief.  Objective: Vital signs in last 24 hours: Temp:  [97.5 F (36.4 C)-98.1 F (36.7 C)] 97.5 F (36.4 C) (10/15 0355) Pulse Rate:  [68-81] 81 (10/15 0355) Cardiac Rhythm:  [-] Normal sinus rhythm (10/14 2000) Resp:  [13-23] 22 (10/15 0355) BP: (101-129)/(40-59) 129/55 mmHg (10/15 0355) SpO2:  [93 %-99 %] 95 % (10/15 0355) Arterial Line BP: (114)/(54) 114/54 mmHg (10/14 0832)  Intake/Output from previous day: 10/14 0701 - 10/15 0700 In: 1200 [I.V.:1200] Out: 780 [Urine:540; Chest Tube:240]  General appearance: alert, cooperative and no distress Heart: regular rate and rhythm Lungs: clear to auscultation bilaterally Abdomen: soft, non-tender; bowel sounds normal; no masses,  no organomegaly Wound: clean and dry  Lab Results:  Recent Labs  01/27/13 0440 01/28/13 0408  WBC 10.5 6.4  HGB 11.5* 10.5*  HCT 34.0* 31.1*  PLT 252 198   BMET:  Recent Labs  01/27/13 1719 01/28/13 0408  NA 130* 137  K 3.7 4.0  CL 97 105  CO2 25 24  GLUCOSE 124* 93  BUN 8 7  CREATININE 0.73 0.69  CALCIUM 8.1* 7.6*    PT/INR: No results found for this basename: LABPROT, INR,  in the last 72 hours ABG    Component Value Date/Time   PHART 7.400 01/27/2013 0500   HCO3 24.6* 01/27/2013 0500   TCO2 25.9 01/27/2013 0500   O2SAT 94.0 01/27/2013 0500   CBG (last 3)  No results found for this basename: GLUCAP,  in the last 72 hours  Assessment/Plan: S/P Procedure(s) (LRB): VIDEO ASSISTED THORACOSCOPY (VATS) with wedge resection right middle lobe nodule , segmental resection rigth lower lobe lung, lymph node  sampling, (Right)  1. Chest tube- no air leak appreciated this morning, 240 cc output overnight, CXR with no change in right apical pneumothorax- will place chest tube to water seal, possibly d/c one tube soon 2. Pulm- wean oxygen as tolerated, encouraged use of IS 3. Renal- U/O improved with increase in IV fluids, will continue for now will decrease once PO intake improved 4. Dispo- patient stable, CXR in AM, further management per staff   LOS: 2 days    [name] 01/28/2013  She feels better Has a very small air leak on water seal- leave on water seal Drainage a little too high to dc posterior CT'  PATH- T1N0- stage IA well differentiated adenocarcinoma- patient and family informed

## 2013-01-29 ENCOUNTER — Inpatient Hospital Stay (HOSPITAL_COMMUNITY): Payer: Medicare Other

## 2013-01-29 DIAGNOSIS — Z4682 Encounter for fitting and adjustment of non-vascular catheter: Secondary | ICD-10-CM | POA: Diagnosis not present

## 2013-01-29 DIAGNOSIS — J9383 Other pneumothorax: Secondary | ICD-10-CM | POA: Diagnosis not present

## 2013-01-29 LAB — URINALYSIS, ROUTINE W REFLEX MICROSCOPIC
Glucose, UA: NEGATIVE mg/dL
Ketones, ur: 15 mg/dL — AB
Nitrite: NEGATIVE
Specific Gravity, Urine: 1.012 (ref 1.005–1.030)
Urobilinogen, UA: 0.2 mg/dL (ref 0.0–1.0)
pH: 6 (ref 5.0–8.0)

## 2013-01-29 LAB — URINE MICROSCOPIC-ADD ON

## 2013-01-29 NOTE — Progress Notes (Addendum)
      [address]       [name] [address]             [phone]      3 Days Post-Op Procedure(s) (LRB): VIDEO ASSISTED THORACOSCOPY (VATS) with wedge resection right middle lobe nodule , segmental resection rigth lower lobe lung, lymph node sampling, (Right)  Subjective:  [name] continues to have soreness around chest tube site.  She also continues to have issues with dysuria, patient states she is urinating, but does not feel like her bladder is completing empty, prompting nurse to straight cath her to remove residual urine.  No BM  Objective: Vital signs in last 24 hours: Temp:  [97.8 F (36.6 C)-98.6 F (37 C)] 97.9 F (36.6 C) (10/16 0317) Pulse Rate:  [89-118] 89 (10/16 0317) Cardiac Rhythm:  [-] Normal sinus rhythm (10/16 0317) Resp:  [14-23] 14 (10/16 0319) BP: (122-143)/(53-76) 136/57 mmHg (10/16 0317) SpO2:  [92 %-100 %] 92 % (10/16 0319)   Intake/Output from previous day: 10/15 0701 - 10/16 0700 In: 600 [P.O.:360; I.V.:240] Out: 1525 [Urine:1175; Chest Tube:350] Intake/Output this shift: Total I/O In: -  Out: 1100 [Urine:1000; Chest Tube:100]  General appearance: alert, cooperative and no distress Heart: regular rate and rhythm Lungs: clear to auscultation bilaterally Abdomen: soft, non-tender; bowel sounds normal; no masses,  no organomegaly Wound: clean and dry  Lab Results:  Recent Labs  01/27/13 0440 01/28/13 0408  WBC 10.5 6.4  HGB 11.5* 10.5*  HCT 34.0* 31.1*  PLT 252 198   BMET:  Recent Labs  01/27/13 1719 01/28/13 0408  NA 130* 137  K 3.7 4.0  CL 97 105  CO2 25 24  GLUCOSE 124* 93  BUN 8 7  CREATININE 0.73 0.69  CALCIUM 8.1* 7.6*    PT/INR: No results found for this basename: LABPROT, INR,  in the last 72 hours ABG    Component Value Date/Time   PHART 7.400 01/27/2013 0500   HCO3 24.6* 01/27/2013 0500   TCO2 25.9 01/27/2013 0500   O2SAT 94.0 01/27/2013 0500   CBG (last 3)  No results found for this  basename: GLUCAP,  in the last 72 hours  Assessment/Plan: S/P Procedure(s) (LRB): VIDEO ASSISTED THORACOSCOPY (VATS) with wedge resection right middle lobe nodule , segmental resection rigth lower lobe lung, lymph node sampling, (Right)  1. Chest tube- minimal air leak with cough, CXR with apical pneumothorax on right, 190 cc output recorded overnight, leave chest tube to water seal, possibly d/c posterior chest tube 2. Pulm- wean oxygen as tolerated, good use of IS 3. Renal- good U/O, will decrease IV Fluids 4. GU- dysuria, urinary retention, will send UA, if no improvement of symptoms would benefit from outpatient Urology follow up 5. Dispo- patient stable, possibly d/c one chest tube today, CXR In AM   LOS: 3 days    BARRETT, ERIN 01/29/2013  Patient seen and examined, agree with above I don't see an air leak right now- will dc anterior CT today Rn will place foley if she has urinary retention again

## 2013-01-29 NOTE — Progress Notes (Signed)
Patient voided 100 cc. Still felt the urge to void, but unable to do so. Ladder scanned patient, showed 650cc, inserted foley per Dr. Dorris Fetch order. Urine return of 750cc, clear yellow urine. Will continue to monitor pt.

## 2013-01-30 ENCOUNTER — Inpatient Hospital Stay (HOSPITAL_COMMUNITY): Payer: Medicare Other

## 2013-01-30 DIAGNOSIS — Z902 Acquired absence of lung [part of]: Secondary | ICD-10-CM

## 2013-01-30 DIAGNOSIS — J9383 Other pneumothorax: Secondary | ICD-10-CM | POA: Diagnosis not present

## 2013-01-30 DIAGNOSIS — Z4682 Encounter for fitting and adjustment of non-vascular catheter: Secondary | ICD-10-CM | POA: Diagnosis not present

## 2013-01-30 MED ORDER — PHENOL 1.4 % MT LIQD
1.0000 | OROMUCOSAL | Status: DC | PRN
  Filled 2013-01-30: qty 177

## 2013-01-30 NOTE — Progress Notes (Signed)
Utilization review completed.  

## 2013-01-30 NOTE — Discharge Summary (Signed)
Physician Discharge Summary  Patient ID: ROSELINDA BAHENA MRN: [id number] DOB/AGE: 01-05-1941 72 y.o.  Admit date: 01/26/2013 Discharge date: 01/30/2013  Admission Diagnoses:  Patient Active Problem List   Diagnosis Date Noted  . Arthritis 01/21/2013  . Hyperlipidemia 01/21/2013  . Osteoporosis 01/21/2013  . Cataract 01/21/2013  . Pulmonary nodules 01/20/2013  . CONSTIPATION 08/16/2008   Discharge Diagnoses:   Patient Active Problem List   Diagnosis Date Noted  . S/P partial lobectomy of lung 01/30/2013  . Arthritis 01/21/2013  . Hyperlipidemia 01/21/2013  . Osteoporosis 01/21/2013  . Cataract 01/21/2013  . Pulmonary nodules 01/20/2013  . CONSTIPATION 08/16/2008   Discharged Condition: good  History of Present Illness:   Mrs. Damewood is a 72 year old woman with a 40-pack-year history of smoking who quit in 2007. She has been routinely followed for about 3-1/2 years now for a ground glass opacity in the superior segment of her right lower lobe. Initially this did not show any significant change.  However, on her CT scan of the chest obtained in 2013 it does appear that the lesion had become more prominent. She had a repeat CT scan done January 20, 2013 which showed a definite increase in size and now possibly a small soft tissue component. There also is a 5 mm subpleural nodule in the middle lobe that has not changed over time.  She was subsequently referred to [facility] for possible surgical intervention.  She was evaluated by Dr. Dorris Fetch on 01/21/2013 at which time it was felt the patient wound benefit from Right VATS with wedge resection of the right middle lobe and segmentectomy of left lower lobe.  The risks and benefits of the procedure were explained to the patient and she was agreeable to proceed.  Hospital Course:   The patient presented to the hospital on 01/26/2013.  She was taken to the operating room and underwent Right VATS with wedge resection of the right middle lobe,  superior segmentectomy of the right lower lobe and lymph node resection.  The specimens were sent to pathology.  The patient tolerated the procedure well, was extubated, and taken to the PACU in stable condition.  The patient has had a few difficulties post operatively.  Her chest tubes have exhibited a small air leak, which has slowly resolved.  Her chest xray shows a right sided apical pneumothorax.  The patients posterior chest tube was removed without difficulty.  Her chest xray remained stable post chest tube removal.  She continues to have increased drainage from her remaining chest tube.  However, there is no evidence of air leak present.  We will continue to monitor this and once daily output is less than 200 cc we will remove her final chest tube.  The patient has also had issues with urinary retention.  Initially her Urinary output was low, her IV fluids were increased and this subsequently improved.  However, after removal of foley catheter patient would be able to void, but did not feel that she emptied her bladder completely.  Therefore bladder scan was performed and showed a residual 650 cc of urine present.  Therefore a foley catheter was re-inserted.  Urinalysis was also obtained, which was unremarkable except for some mucous.  We will leave this foley in place for an additional 24 hours and attempt bladder voiding trial at that time.  Patients urine developed a brown turbid appearance.  Repeat UA was sent and was positive for UTI, with culture results showed E. Coli.  She was placed on  Ciprofloxacin with improvement of urinary issues.  Should no further issues arise and we are able to successfully remove foley catheter we anticipate discharge home in the next 24-48 hours.  She will follow up with Dr. Dorris Fetch in 2 weeks with a CXR prior to her appointment.   Significant Diagnostic Studies:  Final Surgical Pathology  1. 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 - THE SURGICAL RESECTION MARGINS ARE NEGATIVE FOR ADENOCARCINOMA. - SEE ONCOLOGY TABLE BELOW. 2. Lung, wedge biopsy/resection, Right middle lobe - BENIGN LUNG PARENCHYMA - TWO BENIGN INTRAPULMONARY LYMPH NODES (0/2). - THERE IS NO EVIDENCE OF MALIGNANCY. 3. Lymph node, biopsy, 11 R - THERE IS NO EVIDENCE OF CARCINOMA IN 1 OF 1 LYMPH NODE (0/1) 4. Lymph node, biopsy, 11 R #2 - THERE IS NO EVIDENCE OF CARCINOMA IN 1 OF 1 LYMPH NODE (0/1). 5. Lymph node, biopsy, 12 R - THERE IS NO EVIDENCE OF CARCINOMA IN 1 OF 1 LYMPH NODE (0/1). 6. Lymph node, biopsy, 12 #2 - THERE IS NO EVIDENCE OF CARCINOMA IN 1 OF 1 LYMPH NODE (0/1). 7. Lymph node, biopsy, 11 R #3 - THERE IS NO EVIDENCE OF CARCINOMA IN 1 OF 1 LYMPH NODE (0/1). 8. Lymph node, biopsy, 12 R #3 - THERE IS NO EVIDENCE OF CARCINOMA IN 1 OF 1 LYMPH NODE (0/1). 9. Lymph node, biopsy, 10 R - THERE IS NO EVIDENCE OF CARCINOMA IN 1 OF 1 LYMPH NODE (0/1). 10. Lymph node, biopsy, 10 R #2 - THERE IS NO EVIDENCE OF CARCINOMA IN 1 OF 1 LYMPH NODE (0/1). 11. Lymph node, biopsy, 7 node - THERE IS NO EVIDENCE OF CARCINOMA IN 1 OF 1 LYMPH NODE (0/1). 12. Lymph node, biopsy, 7 #2 - THERE IS NO EVIDENCE OF CARCINOMA IN 1 OF 1 LYMPH NODE (0/1). 13. Lymph node, biopsy, 7 #3 - THERE IS NO EVIDENCE OF CARCINOMA IN 1 OF 1 LYMPH NODE (0/1). 1 of 4 FINAL for YARETH, KEARSE ([id number]) Diagnosis(continued) 14. Lymph node, biopsy, 10 R #3 - THERE IS NO EVIDENCE OF CARCINOMA IN 1 OF 1 LYMPH NODE (0/1). 15. Lymph node, biopsy, 4 R - THERE IS NO EVIDENCE OF CARCINOMA IN 1 OF 1 LYMPH NODE (0/1).  Treatments: surgery:   Right video-assisted thoracoscopy. Wedge resection of right middle lobe nodule. Right lower lobe superior segmentectomy with mediastinal lymph node dissection  Disposition: Home  Discharge Medications:    Medication List         atorvastatin 20 MG tablet  Commonly known as:   LIPITOR  Take 20 mg by mouth at bedtime.     b complex vitamins tablet  Take 1 tablet by mouth See admin instructions. Takes 1 tablet on Tuesdays, thursdays, sundays     ciprofloxacin 250 MG tablet  Commonly known as:  CIPRO  Take 1 tablet (250 mg total) by mouth 2 (two) times daily. For 5 Days     Fish Oil 1200 MG Caps  Take 1,200 mg by mouth See admin instructions. Takes on mondays, wednesdays, fridays, and saturdays     hydroxypropyl methylcellulose 2.5 % ophthalmic solution  Commonly known as:  ISOPTO TEARS  Place 1 drop into both eyes daily.     ibuprofen 200 MG tablet  Commonly known as:  ADVIL,MOTRIN  Take 200-400 mg by mouth every 8 (eight) hours as needed for pain or headache.     meclizine 25 MG tablet  Commonly known as:  ANTIVERT  Take 25 mg by mouth 3 (three) times daily as needed for dizziness.     meloxicam 15 MG tablet  Commonly known as:  MOBIC  Take 15 mg by mouth daily as needed for pain.     multivitamin with minerals Tabs tablet  Take 1 tablet by mouth See admin instructions. Takes daily on mondays, wednesdays, fridays, and saturdays     Oxycodone HCl 10 MG Tabs  Take 1 tablet (10 mg total) by mouth every 4 (four) hours as needed.     PRILOSEC OTC PO  Take 1 tablet by mouth daily as needed (reflux).     PROBIOTIC DAILY Caps  Take 1 capsule by mouth daily.     raloxifene 60 MG tablet  Commonly known as:  EVISTA  Take 60 mg by mouth See admin instructions. Takes 60mg  on mondays, wednesdays, fridays, saturdays     Vitamin D 2000 UNITS tablet  Take 2,000 Units by mouth daily.           SignedLowella Dandy 01/30/2013, 12:24 PM

## 2013-01-30 NOTE — Progress Notes (Addendum)
      [address]       [name] [address]             [phone]      4 Days Post-Op Procedure(s) (LRB): VIDEO ASSISTED THORACOSCOPY (VATS) with wedge resection right middle lobe nodule , segmental resection rigth lower lobe lung, lymph node sampling, (Right)  Subjective:  [name] continues to have issues with Urinary Retention.  She states she feels able to go but minimal urine comes out.  She had a foley re-inserted last night.   Objective: Vital signs in last 24 hours: Temp:  [97.8 F (36.6 C)-98.9 F (37.2 C)] 98.5 F (36.9 C) (10/17 0700) Pulse Rate:  [76-89] 84 (10/17 0740) Cardiac Rhythm:  [-] Normal sinus rhythm (10/17 0345) Resp:  [15-22] 20 (10/17 0753) BP: (116-144)/(45-67) 126/52 mmHg (10/17 0740) SpO2:  [91 %-97 %] 94 % (10/17 0753)  Intake/Output from previous day: 10/16 0701 - 10/17 0700 In: 960 [P.O.:480; I.V.:480] Out: 2730 [Urine:2300; Chest Tube:430]  General appearance: alert, cooperative and no distress Heart: regular rate and rhythm Lungs: clear to auscultation bilaterally Abdomen: soft, non-tender; bowel sounds normal; no masses,  no organomegaly Extremities: extremities normal, atraumatic, no cyanosis or edema Wound: clean and dry  Lab Results:  Recent Labs  01/28/13 0408  WBC 6.4  HGB 10.5*  HCT 31.1*  PLT 198   BMET:  Recent Labs  01/27/13 1719 01/28/13 0408  NA 130* 137  K 3.7 4.0  CL 97 105  CO2 25 24  GLUCOSE 124* 93  BUN 8 7  CREATININE 0.73 0.69  CALCIUM 8.1* 7.6*    PT/INR: No results found for this basename: LABPROT, INR,  in the last 72 hours ABG    Component Value Date/Time   PHART 7.400 01/27/2013 0500   HCO3 24.6* 01/27/2013 0500   TCO2 25.9 01/27/2013 0500   O2SAT 94.0 01/27/2013 0500   CBG (last 3)  No results found for this basename: GLUCAP,  in the last 72 hours  Assessment/Plan: S/P Procedure(s) (LRB): VIDEO ASSISTED THORACOSCOPY (VATS) with wedge resection right middle lobe  nodule , segmental resection rigth lower lobe lung, lymph node sampling, (Right)  1. Chest tube- 430 cc output yesterday, no air leak appreciated, questionable enlargement of her apical pneumothorax- will leave chest tube in place today for increased output 2. Pulm- weans oxygen as tolerated, continue IS 3. GU- Urinary rentention, foley placed yesterday, UA unremarkable except for presence of mucous 4. Dispo- patient stable, need to get Urinary retention resolved, leave chest tube today    LOS: 4 days    [name], [name] 01/30/2013  She looks good overall Foley replaced for urinary retention- will wait 24 hours then dc foley for a voiding trial No air leak, CXR shows a small apical space, CT draining a little too much to dc today Dc CT when < 200 ml/24 hours

## 2013-01-31 ENCOUNTER — Inpatient Hospital Stay (HOSPITAL_COMMUNITY): Payer: Medicare Other

## 2013-01-31 DIAGNOSIS — Z4682 Encounter for fitting and adjustment of non-vascular catheter: Secondary | ICD-10-CM | POA: Diagnosis not present

## 2013-01-31 DIAGNOSIS — J9383 Other pneumothorax: Secondary | ICD-10-CM | POA: Diagnosis not present

## 2013-01-31 MED ORDER — LACTULOSE 10 GM/15ML PO SOLN
20.0000 g | Freq: Once | ORAL | Status: AC
  Administered 2013-01-31: 20 g via ORAL
  Filled 2013-01-31: qty 30

## 2013-01-31 NOTE — Progress Notes (Addendum)
      [address]       [place] [address]             [phone]       5 Days Post-Op Procedure(s) (LRB): VIDEO ASSISTED THORACOSCOPY (VATS) with wedge resection right middle lobe nodule , segmental resection rigth lower lobe lung, lymph node sampling, (Right)  Subjective: Patient without bowel movement.  Objective: Vital signs in last 24 hours: Temp:  [97.5 F (36.4 C)-98.4 F (36.9 C)] 98.4 F (36.9 C) (10/18 0800) Pulse Rate:  [72-92] 72 (10/18 1100) Cardiac Rhythm:  [-] Normal sinus rhythm (10/17 2000) Resp:  [13-22] 22 (10/18 1151) BP: (128-151)/(48-54) 151/48 mmHg (10/18 0800) SpO2:  [31 %-96 %] 92 % (10/18 1151)    Intake/Output from previous day: 10/17 0701 - 10/18 0700 In: 260 [P.O.:240; I.V.:20] Out: 1785 [Urine:1625; Chest Tube:160]   Physical Exam:  Cardiovascular: RRR Pulmonary: Clear to auscultation on the left; coarse breath sounds on the right Abdomen: Soft, non tender, bowel sounds present. Extremities:Trace bilateral lower extremity edema. Wounds: Clean and dry.  No erythema or signs of infection. Chest Tube: to water seal and there is no air leak  Lab Results: CBC:No results found for this basename: WBC, HGB, HCT, PLT,  in the last 72 hours BMET: No results found for this basename: NA, K, CL, CO2, GLUCOSE, BUN, CREATININE, CALCIUM,  in the last 72 hours  PT/INR: No results found for this basename: LABPROT, INR,  in the last 72 hours ABG:  INR: Will add last result for INR, ABG once components are confirmed Will add last 4 CBG results once components are confirmed  Assessment/Plan:  1. CV - SR 2.  Pulmonary - Chest tube with 200 cc of output. Chest tube is to water seal and there is no air leak. CXR this am shows a right apical pneumothorax and atelectasis at bases. Will leave chest tube for today. Check CXR in am. Encourage incentive spirometer. 3.Urinary retention-will remove foley and try voiding trial 4.LOC  constipation  ZIMMERMAN,DONIELLE MPA-C 01/31/2013,11:55 AM  Agree with above. I think the apical space is unchanged on cxr. Keep the cxr in today since it is still draining and reevaluate tomorrow.

## 2013-02-01 ENCOUNTER — Inpatient Hospital Stay (HOSPITAL_COMMUNITY): Payer: Medicare Other

## 2013-02-01 DIAGNOSIS — J9383 Other pneumothorax: Secondary | ICD-10-CM | POA: Diagnosis not present

## 2013-02-01 LAB — URINALYSIS, ROUTINE W REFLEX MICROSCOPIC
Bilirubin Urine: NEGATIVE
Glucose, UA: NEGATIVE mg/dL
Ketones, ur: NEGATIVE mg/dL
Urobilinogen, UA: 1 mg/dL (ref 0.0–1.0)
pH: 7 (ref 5.0–8.0)

## 2013-02-01 LAB — URINE MICROSCOPIC-ADD ON

## 2013-02-01 MED ORDER — CIPROFLOXACIN HCL 250 MG PO TABS
250.0000 mg | ORAL_TABLET | Freq: Two times a day (BID) | ORAL | Status: DC
  Administered 2013-02-01 – 2013-02-03 (×4): 250 mg via ORAL
  Filled 2013-02-01 (×6): qty 1

## 2013-02-01 NOTE — Progress Notes (Addendum)
      [address]       [place] [address]             [phone]       6 Days Post-Op Procedure(s) (LRB): VIDEO ASSISTED THORACOSCOPY (VATS) with wedge resection right middle lobe nodule , segmental resection rigth lower lobe lung, lymph node sampling, (Right)  Subjective: Patient thinks she might have a UTI. She had a bowel movement yesterday.  Objective: Vital signs in last 24 hours: Temp:  [97.8 F (36.6 C)-99.1 F (37.3 C)] 99.1 F (37.3 C) (10/19 0825) Pulse Rate:  [72-110] 84 (10/19 0825) Cardiac Rhythm:  [-] Normal sinus rhythm (10/19 0342) Resp:  [13-22] 21 (10/19 0825) BP: (108-139)/(43-94) 139/51 mmHg (10/19 0825) SpO2:  [91 %-97 %] 91 % (10/19 0825)    Intake/Output from previous day: 10/18 0701 - 10/19 0700 In: 1280 [P.O.:840; I.V.:440] Out: 2276 [Urine:1925; Stool:1; Chest Tube:350]   Physical Exam:  Cardiovascular: RRR Pulmonary: Clear to auscultation on the left; coarse breath sounds on the right Abdomen: Soft, non tender, bowel sounds present. Extremities:Trace bilateral lower extremity edema. Wounds: Clean and dry.  No erythema or signs of infection. Chest Tube: to water seal and there is some tidling   Lab Results: CBC:No results found for this basename: WBC, HGB, HCT, PLT,  in the last 72 hours BMET: No results found for this basename: NA, K, CL, CO2, GLUCOSE, BUN, CREATININE, CALCIUM,  in the last 72 hours  PT/INR: No results found for this basename: LABPROT, INR,  in the last 72 hours ABG:  INR: Will add last result for INR, ABG once components are confirmed Will add last 4 CBG results once components are confirmed  Assessment/Plan:  1. CV - SR 2.  Pulmonary - Chest tube with 200 cc of output since 4 pm yesterday. Chest tube is to water seal and there is some tidling. CXR this am shows a stable,right apical pneumothorax and atelectasis at bases R>L. Will leave chest tube for now.Encourage incentive spirometer. 3.Urinary  retention-able to void on own. Will check UA   ZIMMERMAN,DONIELLE MPA-C 02/01/2013,8:44 AM   Chart reviewed, patient examined, agree with above. There is still a small air leak with coughing. Keep CT to water seal.

## 2013-02-02 ENCOUNTER — Encounter: Payer: Self-pay | Admitting: *Deleted

## 2013-02-02 ENCOUNTER — Inpatient Hospital Stay (HOSPITAL_COMMUNITY): Payer: Medicare Other

## 2013-02-02 DIAGNOSIS — J9383 Other pneumothorax: Secondary | ICD-10-CM | POA: Diagnosis not present

## 2013-02-02 DIAGNOSIS — J9819 Other pulmonary collapse: Secondary | ICD-10-CM | POA: Diagnosis not present

## 2013-02-02 NOTE — Progress Notes (Signed)
Alk gene rearrangement not detected.  Fax received 02/02/13

## 2013-02-02 NOTE — Progress Notes (Signed)
Utilization review completed.  

## 2013-02-02 NOTE — Progress Notes (Signed)
Tolerated tube clampming without issue  CXR essentially unchanged  After unclamping a few small bubbles came through water chamber but then stopped and no further bubbles despite repeated coughing trials  Will dc CT

## 2013-02-02 NOTE — Progress Notes (Signed)
7 Days Post-Op Procedure(s) (LRB): VIDEO ASSISTED THORACOSCOPY (VATS) with wedge resection right middle lobe nodule , segmental resection rigth lower lobe lung, lymph node sampling, (Right) Subjective: Some pain from chest tube  Objective: Vital signs in last 24 hours: Temp:  [97.9 F (36.6 Rivera)-99.1 F (37.3 Rivera)] 97.9 F (36.6 Rivera) (10/20 0404) Pulse Rate:  [79-96] 87 (10/20 0404) Cardiac Rhythm:  [-] Normal sinus rhythm (10/20 0404) Resp:  [13-21] 13 (10/20 0404) BP: (97-139)/(31-85) 114/31 mmHg (10/20 0404) SpO2:  [91 %-93 %] 93 % (10/20 0404)  Hemodynamic parameters for last 24 hours:    Intake/Output from previous day: 10/19 0701 - 10/20 0700 In: 1500 [P.O.:1080; I.V.:420] Out: 1925 [Urine:1775; Chest Tube:150] Intake/Output this shift:    General appearance: alert and no distress Heart: regular rate and rhythm Lungs: diminished breath sounds right base Wound: clean and dry has very small intermittent air leak  Lab Results: No results found for this basename: WBC, HGB, HCT, PLT,  in the last 72 hours BMET: No results found for this basename: NA, K, CL, CO2, GLUCOSE, BUN, CREATININE, CALCIUM,  in the last 72 hours  PT/INR: No results found for this basename: LABPROT, INR,  in the last 72 hours ABG    Component Value Date/Time   PHART 7.400 01/27/2013 0500   HCO3 24.6* 01/27/2013 0500   TCO2 25.9 01/27/2013 0500   O2SAT 94.0 01/27/2013 0500   CBG (last 3)  No results found for this basename: GLUCAP,  in the last 72 hours  Assessment/Plan: S/P Procedure(s) (LRB): VIDEO ASSISTED THORACOSCOPY (VATS) with wedge resection right middle lobe nodule , segmental resection rigth lower lobe lung, lymph node sampling, (Right) - CXR unchanged- shows small apical space  She has an unusual air leak- intermittent and small- may be air entraining around CT- will clamp tube and repeat CXR at noon- if stable will dc CT  Continue ambulation  UTI- culture pending- Day 2 cipro   LOS: 7 days    [name] 02/02/2013

## 2013-02-03 ENCOUNTER — Inpatient Hospital Stay (HOSPITAL_COMMUNITY): Payer: Medicare Other

## 2013-02-03 DIAGNOSIS — J9819 Other pulmonary collapse: Secondary | ICD-10-CM | POA: Diagnosis not present

## 2013-02-03 DIAGNOSIS — J9383 Other pneumothorax: Secondary | ICD-10-CM | POA: Diagnosis not present

## 2013-02-03 LAB — URINE CULTURE: Colony Count: >=100000

## 2013-02-03 MED ORDER — OXYCODONE HCL 10 MG PO TABS: 10.0000 mg | ORAL_TABLET | ORAL | Status: DC | PRN

## 2013-02-03 MED ORDER — CIPROFLOXACIN HCL 250 MG PO TABS: 250.0000 mg | ORAL_TABLET | Freq: Two times a day (BID) | ORAL | Status: DC

## 2013-02-03 NOTE — Progress Notes (Addendum)
      [address]       [place] [address]             [phone]     8 Days Post-Op Procedure(s) (LRB): VIDEO ASSISTED THORACOSCOPY (VATS) with wedge resection right middle lobe nodule , segmental resection rigth lower lobe lung, lymph node sampling, (Right)  Subjective:  [name] has no complaints this morning.  She states she feels good and is ready to go home.  Objective: Vital signs in last 24 hours: Temp:  [98 F (36.7 C)-98.7 F (37.1 C)] 98.5 F (36.9 C) (10/21 0757) Pulse Rate:  [77-88] 83 (10/21 0757) Cardiac Rhythm:  [-] Normal sinus rhythm (10/21 0757) Resp:  [15-20] 19 (10/21 0800) BP: (111-140)/(41-49) 120/46 mmHg (10/21 0757) SpO2:  [88 %-97 %] 91 % (10/21 0800)  Intake/Output from previous day: 10/20 0701 - 10/21 0700 In: 820 [P.O.:600; I.V.:220] Out: 400 [Urine:350; Chest Tube:50]  General appearance: alert, cooperative and no distress Heart: regular rate and rhythm Lungs: clear to auscultation bilaterally Abdomen: soft, non-tender; bowel sounds normal; no masses,  no organomegaly Wound: clean and dry  Lab Results: No results found for this basename: WBC, HGB, HCT, PLT,  in the last 72 hours BMET: No results found for this basename: NA, K, CL, CO2, GLUCOSE, BUN, CREATININE, CALCIUM,  in the last 72 hours  PT/INR: No results found for this basename: LABPROT, INR,  in the last 72 hours ABG    Component Value Date/Time   PHART 7.400 01/27/2013 0500   HCO3 24.6* 01/27/2013 0500   TCO2 25.9 01/27/2013 0500   O2SAT 94.0 01/27/2013 0500   CBG (last 3)  No results found for this basename: GLUCAP,  in the last 72 hours  Assessment/Plan: S/P Procedure(s) (LRB): VIDEO ASSISTED THORACOSCOPY (VATS) with wedge resection right middle lobe nodule , segmental resection rigth lower lobe lung, lymph node sampling, (Right)  1. Chest tube- removed yesterday, pneumothorax remains stable 2. UTI- + E.Coli continue Cipro 3. Adenocarcinoma 4.  Dispo= patient stable, will d/c home today   LOS: 8 days    Rivera, Melinda 02/03/2013  Patient seen and examined, agree with above  Home today

## 2013-02-03 NOTE — Progress Notes (Signed)
Pt O2 sats dropped into the 80's oxygen applied at 2.L/N C

## 2013-02-03 NOTE — Progress Notes (Signed)
Discussed discharge instructions and medications with patient and patients sisters. All verbalized understanding with all questions answered. VSS. Dressing to chest tube site is clean, dry, and intact. Pt discharged home with family.  [facility]

## 2013-02-03 NOTE — Progress Notes (Signed)
Wasted 2cc of fentanyl pca with Mickle Asper.

## 2013-02-06 ENCOUNTER — Other Ambulatory Visit: Payer: Self-pay | Admitting: *Deleted

## 2013-02-06 DIAGNOSIS — R918 Other nonspecific abnormal finding of lung field: Secondary | ICD-10-CM

## 2013-02-10 ENCOUNTER — Ambulatory Visit (INDEPENDENT_AMBULATORY_CARE_PROVIDER_SITE_OTHER): Payer: Self-pay | Admitting: Thoracic Surgery (Cardiothoracic Vascular Surgery)

## 2013-02-10 ENCOUNTER — Encounter: Payer: Self-pay | Admitting: Thoracic Surgery (Cardiothoracic Vascular Surgery)

## 2013-02-10 ENCOUNTER — Ambulatory Visit
Admission: RE | Admit: 2013-02-10 | Discharge: 2013-02-10 | Disposition: A | Discharge status: Home / Self Care | Payer: Medicare Other | Source: Ambulatory Visit | Attending: Thoracic Surgery (Cardiothoracic Vascular Surgery) | Admitting: Thoracic Surgery (Cardiothoracic Vascular Surgery)

## 2013-02-10 VITALS — BP 134/61 | HR 84 | Resp 18 | Ht 68.0 in | Wt 143.0 lb

## 2013-02-10 DIAGNOSIS — R918 Other nonspecific abnormal finding of lung field: Secondary | ICD-10-CM

## 2013-02-10 DIAGNOSIS — J9383 Other pneumothorax: Secondary | ICD-10-CM | POA: Diagnosis not present

## 2013-02-10 NOTE — Progress Notes (Signed)
HPI:  [name] returns today for a scheduled postoperative followup visit. She is a 72 year old woman who had a thoracoscopic right lower lobe superior segmentectomy on October 13. 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 Postoperatively she had an air leak that was small but persisted until postoperative day #7. Her chest tube was removed then was discharged on postoperative day 8.  Since discharge his continued to have some pain. She's taking 10 mg oxycodone tablets 3-4 times a day. She said 3 nights in a row last week she had a lot of pain and is unsure why. She's not having any significant wheezing. She does have occasional cough.  Past Medical History  Diagnosis Date  . Hyperplastic polyps of stomach   . Osteoporosis   . H/O vitamin D deficiency   . Hypercholesteremia   . Joint pain   . GERD (gastroesophageal reflux disease)   . Arthritis       Current Outpatient Prescriptions  Medication Sig Dispense Refill  . atorvastatin (LIPITOR) 20 MG tablet Take 20 mg by mouth at bedtime.       Marland Kitchen b complex vitamins tablet Take 1 tablet by mouth See admin instructions. Takes 1 tablet on Tuesdays, thursdays, sundays      . Cholecalciferol (VITAMIN D) 2000 UNITS tablet Take 2,000 Units by mouth daily.      . hydroxypropyl methylcellulose (ISOPTO TEARS) 2.5 % ophthalmic solution Place 1 drop into both eyes daily.      Marland Kitchen ibuprofen (ADVIL,MOTRIN) 200 MG tablet Take 200-400 mg by mouth every 8 (eight) hours as needed for pain or headache.      . meclizine (ANTIVERT) 25 MG tablet Take 25 mg by mouth 3 (three) times daily as needed for dizziness.       . meloxicam (MOBIC) 15 MG tablet Take 15 mg by mouth daily as needed for pain.       . Multiple Vitamin (MULTIVITAMIN WITH MINERALS) TABS tablet Take 1 tablet by mouth See admin instructions. Takes daily on mondays, wednesdays, fridays, and saturdays      . Omega-3 Fatty Acids (FISH OIL) 1200 MG CAPS Take 1,200  mg by mouth See admin instructions. Takes on mondays, wednesdays, fridays, and saturdays      . Omeprazole Magnesium (PRILOSEC OTC PO) Take 1 tablet by mouth daily as needed (reflux).      Marland Kitchen oxyCODONE 10 MG TABS Take 1 tablet (10 mg total) by mouth every 4 (four) hours as needed.  40 tablet  0  . Probiotic Product (PROBIOTIC DAILY) CAPS Take 1 capsule by mouth daily.      . raloxifene (EVISTA) 60 MG tablet Take 60 mg by mouth See admin instructions. Takes 60mg  on mondays, wednesdays, fridays, saturdays       No current facility-administered medications for this visit.    Physical Exam  Diagnostic Tests: Chest x-ray 02/10/2013 Shows a small effusion and a significant decrease in the apical space.  Impression: 72 year old woman who is now about 3 weeks postop from a thoracoscopic right lower lobe superior segmentectomy. We also removed couple of small lymph nodes in the middle lobe which were benign. She has stage IA disease and does not need any adjuvant therapy.  She is still having significant pain and is using oxycodone several times a day. I gave her a prescription for an additional 60 tablets of oxycodone 10 mg one tablet by mouth 4 times daily when necessary pain.  I recommended that she  not drive until she is only taking the oxycodone at bedtime.  She asked about traveling to [facility] by plane at the end of the month. There is no medical reason she will be able to do that I did caution her that she may not feel up to it physically at that time.     Plan: I will plan to see her back in 3 months for a 4 month followup visit. We will do a PA and lateral chest x-ray at that time.

## 2013-02-17 ENCOUNTER — Ambulatory Visit: Payer: Medicare Other | Admitting: Thoracic Surgery (Cardiothoracic Vascular Surgery)

## 2013-04-28 DIAGNOSIS — M79609 Pain in unspecified limb: Secondary | ICD-10-CM | POA: Diagnosis not present

## 2013-04-28 DIAGNOSIS — I8310 Varicose veins of unspecified lower extremity with inflammation: Secondary | ICD-10-CM | POA: Diagnosis not present

## 2013-05-12 DIAGNOSIS — I872 Venous insufficiency (chronic) (peripheral): Secondary | ICD-10-CM | POA: Diagnosis not present

## 2013-05-12 DIAGNOSIS — I8310 Varicose veins of unspecified lower extremity with inflammation: Secondary | ICD-10-CM | POA: Diagnosis not present

## 2013-05-18 ENCOUNTER — Other Ambulatory Visit: Payer: Self-pay | Admitting: *Deleted

## 2013-05-18 DIAGNOSIS — R918 Other nonspecific abnormal finding of lung field: Secondary | ICD-10-CM

## 2013-05-19 ENCOUNTER — Ambulatory Visit
Admission: RE | Admit: 2013-05-19 | Discharge: 2013-05-19 | Disposition: A | Discharge status: Home / Self Care | Payer: Medicare Other | Source: Ambulatory Visit | Attending: Thoracic Surgery (Cardiothoracic Vascular Surgery) | Admitting: Thoracic Surgery (Cardiothoracic Vascular Surgery)

## 2013-05-19 ENCOUNTER — Encounter: Payer: Self-pay | Admitting: Thoracic Surgery (Cardiothoracic Vascular Surgery)

## 2013-05-19 ENCOUNTER — Ambulatory Visit (INDEPENDENT_AMBULATORY_CARE_PROVIDER_SITE_OTHER): Payer: Medicare Other | Admitting: Thoracic Surgery (Cardiothoracic Vascular Surgery)

## 2013-05-19 VITALS — BP 109/58 | HR 77 | Resp 20 | Ht 68.0 in | Wt 142.0 lb

## 2013-05-19 DIAGNOSIS — Z09 Encounter for follow-up examination after completed treatment for conditions other than malignant neoplasm: Secondary | ICD-10-CM | POA: Diagnosis not present

## 2013-05-19 DIAGNOSIS — C3490 Malignant neoplasm of unspecified part of unspecified bronchus or lung: Secondary | ICD-10-CM

## 2013-05-19 DIAGNOSIS — Z9889 Other specified postprocedural states: Secondary | ICD-10-CM

## 2013-05-19 DIAGNOSIS — R918 Other nonspecific abnormal finding of lung field: Secondary | ICD-10-CM

## 2013-05-19 DIAGNOSIS — Z902 Acquired absence of lung [part of]: Secondary | ICD-10-CM

## 2013-05-19 DIAGNOSIS — J984 Other disorders of lung: Secondary | ICD-10-CM | POA: Diagnosis not present

## 2013-05-19 DIAGNOSIS — C3491 Malignant neoplasm of unspecified part of right bronchus or lung: Secondary | ICD-10-CM | POA: Insufficient documentation

## 2013-05-19 NOTE — Progress Notes (Signed)
HPI:  [name] is a 73 year old woman who had a right lower lobe superior segmentectomy on October 13 for a stage IA (T1a, N0) non-small cell carcinoma. Her postoperative course was unremarkable.  She was last in the office for her initial postoperative followup. At that time she was still having a considerable amount of pain. Since then she's continued to improve. She does have an occasional pain on the right side, but does not having to take any medication for that. She's not noted any problems with her breathing. She is very concerned about the possibility of recurrence.  Past Medical History  Diagnosis Date  . Hyperplastic polyps of stomach   . Osteoporosis   . H/O vitamin D deficiency   . Hypercholesteremia   . Joint pain   . GERD (gastroesophageal reflux disease)   . Arthritis       Current Outpatient Prescriptions  Medication Sig Dispense Refill  . atorvastatin (LIPITOR) 20 MG tablet Take 20 mg by mouth at bedtime.       Marland Kitchen b complex vitamins tablet Take 1 tablet by mouth See admin instructions. Takes 1 tablet on Tuesdays, thursdays, sundays      . Cholecalciferol (VITAMIN D) 2000 UNITS tablet Take 2,000 Units by mouth daily.      . hydroxypropyl methylcellulose (ISOPTO TEARS) 2.5 % ophthalmic solution Place 1 drop into both eyes daily.      Marland Kitchen ibuprofen (ADVIL,MOTRIN) 200 MG tablet Take 200-400 mg by mouth every 8 (eight) hours as needed for pain or headache.      . meclizine (ANTIVERT) 25 MG tablet Take 25 mg by mouth 3 (three) times daily as needed for dizziness.       . meloxicam (MOBIC) 15 MG tablet Take 15 mg by mouth daily as needed for pain.       . Multiple Vitamin (MULTIVITAMIN WITH MINERALS) TABS tablet Take 1 tablet by mouth See admin instructions. Takes daily on mondays, wednesdays, fridays, and saturdays      . Omega-3 Fatty Acids (FISH OIL) 1200 MG CAPS Take 1,200 mg by mouth See admin instructions. Takes on mondays, wednesdays, fridays, and saturdays      .  Omeprazole Magnesium (PRILOSEC OTC PO) Take 1 tablet by mouth daily as needed (reflux).      . Probiotic Product (PROBIOTIC DAILY) CAPS Take 1 capsule by mouth daily.      . raloxifene (EVISTA) 60 MG tablet Take 60 mg by mouth See admin instructions. Takes 60mg  on mondays, wednesdays, fridays, saturdays       No current facility-administered medications for this visit.    Physical Exam BP 109/58  Pulse 77  Resp 20  Ht 5\' 8"  (1.727 m)  Wt 142 lb (64.411 kg)  BMI 21.60 kg/m2  SpO22 77% 73 year old woman in no acute distress No cervical or subclavicular adenopathy Lungs clear with equal breath sounds bilaterally  Diagnostic Tests: Chest x-ray 05/19/2013 CHEST 2 VIEW  COMPARISON: 02/10/2013  FINDINGS:  Postop right lower lobe resection. Scarring in the right lung base  is unchanged. Negative for pneumothorax. Small pleural effusion  versus pleural scarring on the right.  The left lung is clear. The lungs are hyperinflated compatible with  COPD.  IMPRESSION:  Postop right lower lobe resection. No pneumothorax. No superimposed  acute abnormality.  Electronically Signed  By: Franchot Gallo M.D.  On: 05/19/2013 11:12  Impression: 73 year old woman who is now 3 months post right lower lobe superior segmentectomy for a T1a, N0 stage IA non-small  cell carcinoma. She has no evidence recurrent disease. Overall she is doing well.   Plan: Return in 3 months for her 6 month followup. We will do a CT of the chest at that time.

## 2013-05-26 DIAGNOSIS — M79609 Pain in unspecified limb: Secondary | ICD-10-CM | POA: Diagnosis not present

## 2013-05-26 DIAGNOSIS — I8310 Varicose veins of unspecified lower extremity with inflammation: Secondary | ICD-10-CM | POA: Diagnosis not present

## 2013-06-04 DIAGNOSIS — M353 Polymyalgia rheumatica: Secondary | ICD-10-CM | POA: Diagnosis not present

## 2013-06-04 DIAGNOSIS — IMO0001 Reserved for inherently not codable concepts without codable children: Secondary | ICD-10-CM | POA: Diagnosis not present

## 2013-06-04 DIAGNOSIS — M2550 Pain in unspecified joint: Secondary | ICD-10-CM | POA: Diagnosis not present

## 2013-06-04 DIAGNOSIS — C3490 Malignant neoplasm of unspecified part of unspecified bronchus or lung: Secondary | ICD-10-CM | POA: Diagnosis not present

## 2013-06-09 DIAGNOSIS — I8310 Varicose veins of unspecified lower extremity with inflammation: Secondary | ICD-10-CM | POA: Diagnosis not present

## 2013-06-09 DIAGNOSIS — M79609 Pain in unspecified limb: Secondary | ICD-10-CM | POA: Diagnosis not present

## 2013-06-09 DIAGNOSIS — M7981 Nontraumatic hematoma of soft tissue: Secondary | ICD-10-CM | POA: Diagnosis not present

## 2013-06-24 DIAGNOSIS — M7981 Nontraumatic hematoma of soft tissue: Secondary | ICD-10-CM | POA: Diagnosis not present

## 2013-06-24 DIAGNOSIS — M79609 Pain in unspecified limb: Secondary | ICD-10-CM | POA: Diagnosis not present

## 2013-06-24 DIAGNOSIS — I8310 Varicose veins of unspecified lower extremity with inflammation: Secondary | ICD-10-CM | POA: Diagnosis not present

## 2013-07-23 ENCOUNTER — Other Ambulatory Visit: Payer: Self-pay | Admitting: *Deleted

## 2013-07-23 DIAGNOSIS — C3490 Malignant neoplasm of unspecified part of unspecified bronchus or lung: Secondary | ICD-10-CM

## 2013-08-03 DIAGNOSIS — E7800 Pure hypercholesterolemia, unspecified: Secondary | ICD-10-CM | POA: Diagnosis not present

## 2013-08-03 DIAGNOSIS — F3289 Other specified depressive episodes: Secondary | ICD-10-CM | POA: Diagnosis not present

## 2013-08-03 DIAGNOSIS — Z8639 Personal history of other endocrine, nutritional and metabolic disease: Secondary | ICD-10-CM | POA: Diagnosis not present

## 2013-08-03 DIAGNOSIS — Z79899 Other long term (current) drug therapy: Secondary | ICD-10-CM | POA: Diagnosis not present

## 2013-08-03 DIAGNOSIS — F329 Major depressive disorder, single episode, unspecified: Secondary | ICD-10-CM | POA: Diagnosis not present

## 2013-08-03 DIAGNOSIS — K5900 Constipation, unspecified: Secondary | ICD-10-CM | POA: Diagnosis not present

## 2013-08-03 DIAGNOSIS — C3490 Malignant neoplasm of unspecified part of unspecified bronchus or lung: Secondary | ICD-10-CM | POA: Diagnosis not present

## 2013-08-03 DIAGNOSIS — Z23 Encounter for immunization: Secondary | ICD-10-CM | POA: Diagnosis not present

## 2013-08-03 DIAGNOSIS — F411 Generalized anxiety disorder: Secondary | ICD-10-CM | POA: Diagnosis not present

## 2013-08-04 DIAGNOSIS — Z8639 Personal history of other endocrine, nutritional and metabolic disease: Secondary | ICD-10-CM | POA: Diagnosis not present

## 2013-08-04 DIAGNOSIS — C3490 Malignant neoplasm of unspecified part of unspecified bronchus or lung: Secondary | ICD-10-CM | POA: Diagnosis not present

## 2013-08-04 DIAGNOSIS — Z79899 Other long term (current) drug therapy: Secondary | ICD-10-CM | POA: Diagnosis not present

## 2013-08-04 DIAGNOSIS — K5900 Constipation, unspecified: Secondary | ICD-10-CM | POA: Diagnosis not present

## 2013-08-04 DIAGNOSIS — E7800 Pure hypercholesterolemia, unspecified: Secondary | ICD-10-CM | POA: Diagnosis not present

## 2013-08-04 DIAGNOSIS — Z1331 Encounter for screening for depression: Secondary | ICD-10-CM | POA: Diagnosis not present

## 2013-09-01 ENCOUNTER — Encounter: Payer: Self-pay | Admitting: Thoracic Surgery (Cardiothoracic Vascular Surgery)

## 2013-09-01 ENCOUNTER — Ambulatory Visit
Admission: RE | Admit: 2013-09-01 | Discharge: 2013-09-01 | Disposition: A | Discharge status: Home / Self Care | Payer: Medicare Other | Source: Ambulatory Visit | Attending: Thoracic Surgery (Cardiothoracic Vascular Surgery) | Admitting: Thoracic Surgery (Cardiothoracic Vascular Surgery)

## 2013-09-01 ENCOUNTER — Ambulatory Visit (INDEPENDENT_AMBULATORY_CARE_PROVIDER_SITE_OTHER): Payer: Medicare Other | Admitting: Thoracic Surgery (Cardiothoracic Vascular Surgery)

## 2013-09-01 VITALS — BP 152/73 | HR 86 | Resp 16 | Ht 68.0 in | Wt 142.0 lb

## 2013-09-01 DIAGNOSIS — C3430 Malignant neoplasm of lower lobe, unspecified bronchus or lung: Secondary | ICD-10-CM

## 2013-09-01 DIAGNOSIS — Z902 Acquired absence of lung [part of]: Secondary | ICD-10-CM

## 2013-09-01 DIAGNOSIS — Z9889 Other specified postprocedural states: Secondary | ICD-10-CM

## 2013-09-01 DIAGNOSIS — J438 Other emphysema: Secondary | ICD-10-CM | POA: Diagnosis not present

## 2013-09-01 DIAGNOSIS — C3490 Malignant neoplasm of unspecified part of unspecified bronchus or lung: Secondary | ICD-10-CM

## 2013-09-01 DIAGNOSIS — Z85118 Personal history of other malignant neoplasm of bronchus and lung: Secondary | ICD-10-CM | POA: Diagnosis not present

## 2013-09-01 NOTE — Progress Notes (Signed)
HPI:  [name] is a 73 year old woman who underwent a right lower lobe superior segmentectomy for a T1 N0 non-small cell carcinoma in October 2014. We also did a wedge resection of 2 small nodules in the right middle lobe which were benign lymph nodes. He was last seen in the office in February which time she was doing well with no evidence of recurrent disease. She now returns for her 6 month followup visit.  She says she's been feeling well. She was recently started on some antidepressants. She's not had any chest pain or shortness of breath. She says occasionally she'll get a pinprick pain associated with her incision, but it is relatively rare. Her weight has been stable.  Past Medical History  Diagnosis Date  . Hyperplastic polyps of stomach   . Osteoporosis   . H/O vitamin D deficiency   . Hypercholesteremia   . Joint pain   . GERD (gastroesophageal reflux disease)   . Arthritis   . Non-small cell cancer of right lung 01/2013    Stage IA non small cell      Current Outpatient Prescriptions  Medication Sig Dispense Refill  . atorvastatin (LIPITOR) 10 MG tablet Take 10 mg by mouth daily.      Marland Kitchen b complex vitamins tablet Take 1 tablet by mouth See admin instructions. Takes 1 tablet on Tuesdays, thursdays, sundays      . Cholecalciferol (VITAMIN D) 2000 UNITS tablet Take 2,000 Units by mouth daily.      . hydroxypropyl methylcellulose (ISOPTO TEARS) 2.5 % ophthalmic solution Place 1 drop into both eyes daily.      Marland Kitchen ibuprofen (ADVIL,MOTRIN) 200 MG tablet Take 200-400 mg by mouth every 8 (eight) hours as needed for pain or headache.      . meclizine (ANTIVERT) 25 MG tablet Take 25 mg by mouth 3 (three) times daily as needed for dizziness.       . meloxicam (MOBIC) 15 MG tablet Take 15 mg by mouth daily as needed for pain.       . Multiple Vitamin (MULTIVITAMIN WITH MINERALS) TABS tablet Take 1 tablet by mouth See admin instructions. Takes daily on mondays, wednesdays, fridays, and  saturdays      . Omega-3 Fatty Acids (FISH OIL) 1200 MG CAPS Take 1,200 mg by mouth See admin instructions. Takes on mondays, wednesdays, fridays, and saturdays      . Omeprazole Magnesium (PRILOSEC OTC PO) Take 1 tablet by mouth daily as needed (reflux).      . Probiotic Product (PROBIOTIC DAILY) CAPS Take 1 capsule by mouth daily.      . raloxifene (EVISTA) 60 MG tablet Take 60 mg by mouth See admin instructions. Takes 60mg  on mondays, wednesdays, fridays, saturdays      . ALPRAZolam (XANAX) 0.25 MG tablet       . citalopram (CELEXA) 10 MG tablet       . sertraline (ZOLOFT) 25 MG tablet        No current facility-administered medications for this visit.    Physical Exam BP 152/73  Pulse 86  Resp 16  Ht 5\' 8"  (1.727 m)  Wt 142 lb (64.411 kg)  BMI 21.60 kg/m2  SpO71 41% 73 year old woman, well-developed and well-nourished and in no acute distress Alert and oriented x3 with no focal neurologic deficits Lungs with diminished equal breath sounds bilaterally Incisions well healed No cervical or supraclavicular adenopathy Cardiac regular rate and rhythm  Diagnostic Tests: CT chest 09/01/2013 CT CHEST WITHOUT CONTRAST  TECHNIQUE:  Multidetector CT imaging of the chest was performed following the  standard protocol without IV contrast.  COMPARISON: Plain film 05/19/2013. Most recent CT 01/20/2013.  FINDINGS:  Lungs/Pleura: Moderate centrilobular emphysema. Volume loss and  surgical changes within the right lower lobe. 2 mm lingular nodule  is unchanged on image 30/series 4.  The pleural-based right middle lobe 5 mm opacity described on the  prior exam is not identified.  Trace right-sided pleural fluid and thickening.  Heart/Mediastinum: No supraclavicular adenopathy. Aortic and branch  vessel atherosclerosis. Mild cardiomegaly. Multivessel coronary  artery atherosclerosis. Calcified middle mediastinal nodes,  consistent with old granulomatous disease. No mediastinal or  definite  hilar adenopathy, given limitations of unenhanced CT.  Esophageal air-fluid level on image 30.  Upper Abdomen: Old granulomatous disease in the liver and spleen.  Otherwise, normal image portions of the liver, stomach, pancreas,  adrenal glands.  Bones/Musculoskeletal: Mild superior endplate irregularity at T8 and  T5, chronic. No canal compromise.  IMPRESSION:  1. Interval surgical changes in the right lower lobe, without  locally recurrent or metastatic disease.  2. Centrilobular emphysema. The right middle lobe 5 mm lung nodule  is no longer identified. There is a tiny lingular nodule which is  unchanged and warrants followup attention.  3. Atherosclerosis, including within the coronary arteries.  4. Esophageal air fluid level suggests dysmotility or  gastroesophageal reflux.  5. Trace right-sided pleural fluid and thickening.  Electronically Signed  By: Abigail Miyamoto M.D.  On: 09/01/2013 12:38  Impression: 73 year old woman who is now 6 months post superior segmentectomy for a stage Ia non-small cell carcinoma of the right lower lobe. She has no evidence recurrent disease. She has minimal residual pain from her incision. There is a tiny 2 mm nodule in the left upper lobe centrally which is unchanged. She was concerned about that we reviewed the films and showed her the lesion in question.  I will plan to see her back in 6 months with another CT of the chest

## 2013-10-27 DIAGNOSIS — Z1231 Encounter for screening mammogram for malignant neoplasm of breast: Secondary | ICD-10-CM | POA: Diagnosis not present

## 2013-10-27 DIAGNOSIS — M810 Age-related osteoporosis without current pathological fracture: Secondary | ICD-10-CM | POA: Diagnosis not present

## 2013-10-27 DIAGNOSIS — Z0000 Encounter for general adult medical examination without abnormal findings: Secondary | ICD-10-CM | POA: Diagnosis not present

## 2013-10-27 DIAGNOSIS — Z01419 Encounter for gynecological examination (general) (routine) without abnormal findings: Secondary | ICD-10-CM | POA: Diagnosis not present

## 2013-11-26 DIAGNOSIS — Z961 Presence of intraocular lens: Secondary | ICD-10-CM | POA: Diagnosis not present

## 2013-12-02 DIAGNOSIS — M2550 Pain in unspecified joint: Secondary | ICD-10-CM | POA: Diagnosis not present

## 2013-12-02 DIAGNOSIS — IMO0001 Reserved for inherently not codable concepts without codable children: Secondary | ICD-10-CM | POA: Diagnosis not present

## 2013-12-02 DIAGNOSIS — C3490 Malignant neoplasm of unspecified part of unspecified bronchus or lung: Secondary | ICD-10-CM | POA: Diagnosis not present

## 2013-12-02 DIAGNOSIS — M353 Polymyalgia rheumatica: Secondary | ICD-10-CM | POA: Diagnosis not present

## 2013-12-09 DIAGNOSIS — F329 Major depressive disorder, single episode, unspecified: Secondary | ICD-10-CM | POA: Diagnosis not present

## 2013-12-09 DIAGNOSIS — F411 Generalized anxiety disorder: Secondary | ICD-10-CM | POA: Diagnosis not present

## 2013-12-29 DIAGNOSIS — Z23 Encounter for immunization: Secondary | ICD-10-CM | POA: Diagnosis not present

## 2014-01-29 ENCOUNTER — Other Ambulatory Visit: Payer: Self-pay

## 2014-02-03 DIAGNOSIS — L821 Other seborrheic keratosis: Secondary | ICD-10-CM | POA: Diagnosis not present

## 2014-02-03 DIAGNOSIS — D239 Other benign neoplasm of skin, unspecified: Secondary | ICD-10-CM | POA: Diagnosis not present

## 2014-02-03 DIAGNOSIS — D1801 Hemangioma of skin and subcutaneous tissue: Secondary | ICD-10-CM | POA: Diagnosis not present

## 2014-02-03 DIAGNOSIS — L738 Other specified follicular disorders: Secondary | ICD-10-CM | POA: Diagnosis not present

## 2014-02-03 DIAGNOSIS — D2261 Melanocytic nevi of right upper limb, including shoulder: Secondary | ICD-10-CM | POA: Diagnosis not present

## 2014-02-03 DIAGNOSIS — L820 Inflamed seborrheic keratosis: Secondary | ICD-10-CM | POA: Diagnosis not present

## 2014-02-03 DIAGNOSIS — L814 Other melanin hyperpigmentation: Secondary | ICD-10-CM | POA: Diagnosis not present

## 2014-02-03 DIAGNOSIS — Z808 Family history of malignant neoplasm of other organs or systems: Secondary | ICD-10-CM | POA: Diagnosis not present

## 2014-02-05 ENCOUNTER — Other Ambulatory Visit: Payer: Self-pay | Admitting: *Deleted

## 2014-02-05 DIAGNOSIS — C3491 Malignant neoplasm of unspecified part of right bronchus or lung: Secondary | ICD-10-CM

## 2014-02-23 ENCOUNTER — Ambulatory Visit
Admission: RE | Admit: 2014-02-23 | Discharge: 2014-02-23 | Disposition: A | Discharge status: Home / Self Care | Payer: Medicare Other | Source: Ambulatory Visit | Attending: Thoracic Surgery (Cardiothoracic Vascular Surgery) | Admitting: Thoracic Surgery (Cardiothoracic Vascular Surgery)

## 2014-02-23 ENCOUNTER — Encounter: Payer: Self-pay | Admitting: Thoracic Surgery (Cardiothoracic Vascular Surgery)

## 2014-02-23 ENCOUNTER — Ambulatory Visit (INDEPENDENT_AMBULATORY_CARE_PROVIDER_SITE_OTHER): Payer: Medicare Other | Admitting: Thoracic Surgery (Cardiothoracic Vascular Surgery)

## 2014-02-23 VITALS — BP 114/58 | HR 68 | Resp 16 | Ht 68.0 in | Wt 142.0 lb

## 2014-02-23 DIAGNOSIS — Z902 Acquired absence of lung [part of]: Secondary | ICD-10-CM

## 2014-02-23 DIAGNOSIS — C3491 Malignant neoplasm of unspecified part of right bronchus or lung: Secondary | ICD-10-CM

## 2014-02-23 DIAGNOSIS — Z9889 Other specified postprocedural states: Secondary | ICD-10-CM

## 2014-02-23 DIAGNOSIS — J432 Centrilobular emphysema: Secondary | ICD-10-CM | POA: Diagnosis not present

## 2014-02-23 DIAGNOSIS — Z85118 Personal history of other malignant neoplasm of bronchus and lung: Secondary | ICD-10-CM | POA: Diagnosis not present

## 2014-02-23 NOTE — Progress Notes (Signed)
HPI:  Mrs. Donley returns today for a 1 year follow-up visit.  She is a 73 year old woman who had a right lower lobe superior segmentectomy in October 2014 for a T1, N0 stage IA non-small cell carcinoma. She was last seen in the office in May at which time she was doing well.  She says that she has been feeling well. She doesn't have any problems related to her incisions. Her weight has been stable. She's not had any unusual headaches or visual changes. No new bone or joint pain.  Past Medical History  Diagnosis Date  . Hyperplastic polyps of stomach   . Osteoporosis   . H/O vitamin D deficiency   . Hypercholesteremia   . Joint pain   . GERD (gastroesophageal reflux disease)   . Arthritis   . Non-small cell cancer of right lung 01/2013    Stage IA non small cell      Current Outpatient Prescriptions  Medication Sig Dispense Refill  . atorvastatin (LIPITOR) 10 MG tablet Take 10 mg by mouth daily.    . Cholecalciferol (VITAMIN D) 2000 UNITS tablet Take 1,000 Units by mouth daily.     . hydroxypropyl methylcellulose (ISOPTO TEARS) 2.5 % ophthalmic solution Place 1 drop into both eyes daily.    Marland Kitchen ibuprofen (ADVIL,MOTRIN) 200 MG tablet Take 200-400 mg by mouth every 8 (eight) hours as needed for pain or headache.    . meclizine (ANTIVERT) 25 MG tablet Take 25 mg by mouth 3 (three) times daily as needed for dizziness.     . meloxicam (MOBIC) 15 MG tablet Take 15 mg by mouth daily as needed for pain.     . Multiple Vitamin (MULTIVITAMIN WITH MINERALS) TABS tablet Take 1 tablet by mouth See admin instructions. Takes daily on mondays, wednesdays, fridays, and saturdays    . Omega-3 Fatty Acids (FISH OIL) 1200 MG CAPS Take 1,200 mg by mouth See admin instructions. Takes on mondays, wednesdays, fridays, and saturdays    . Omeprazole Magnesium (PRILOSEC OTC PO) Take 1 tablet by mouth daily as needed (reflux).    . Probiotic Product (PROBIOTIC DAILY) CAPS Take 1 capsule by mouth daily.    .  raloxifene (EVISTA) 60 MG tablet Take 60 mg by mouth See admin instructions. Takes 60mg  on mondays, wednesdays, fridays, saturdays     No current facility-administered medications for this visit.    Physical Exam BP 114/58 mmHg  Pulse 68  Resp 16  Ht 5\' 8"  (1.727 m)  Wt 142 lb (64.411 kg)  BMI 21.60 kg/m2  SpO24 68% 73 year old woman in no acute distress Well-developed and well-nourished Alert and oriented 3 with no focal neurologic deficits Lungs clear with equal breath sounds bilaterally No cervical or suprapubic or adenopathy Cardiac exam regular rate and rhythm normal S1 and S2 Incisions well healed  Diagnostic Tests: CT CHEST WITHOUT CONTRAST  TECHNIQUE: Multidetector CT imaging of the chest was performed following the standard protocol without IV contrast.  COMPARISON: CT 09/01/2013  FINDINGS: Review of the lung parenchyma again demonstrates postsurgical change in the right lower lobe with bandlike pleural thickening. No measurable nodularity. No change from prior. Small central left upper lobe nodule measures 2 mm (image 32, series 4) not changed from prior. Centrilobular emphysema in the upper lobes.  No axillary or supraclavicular lymphadenopathy. No mediastinal or hilar lymphadenopathy. Esophagus is normal. No pericardial fluid.  Limited view of the upper abdomen demonstrates no focal hepatic lesions non contrast exam. Adrenal glands are normal.  No aggressive  osseous lesion.  IMPRESSION: 1. Stable postoperative change in right lower lobe. 2. Central lobular emphysema in the upper lobes. 3. No evidence of lung cancer recurrence.   Electronically Signed  By: Suzy Bouchard M.D.  On: 02/23/2014 10:35   Impression: 73 year old woman who is now one year out from a right lower lobe superior segmentectomy for a stage IA non-small cell carcinoma. She has no evidence of recurrent disease.  Overall she is doing well with a good quality of  life. She stopped smoking years ago.   Plan:  Return in 6 months with CT of chest for 18 month follow-up

## 2014-04-23 DIAGNOSIS — S81809A Unspecified open wound, unspecified lower leg, initial encounter: Secondary | ICD-10-CM | POA: Diagnosis not present

## 2014-08-05 DIAGNOSIS — F339 Major depressive disorder, recurrent, unspecified: Secondary | ICD-10-CM | POA: Diagnosis not present

## 2014-08-05 DIAGNOSIS — E559 Vitamin D deficiency, unspecified: Secondary | ICD-10-CM | POA: Diagnosis not present

## 2014-08-05 DIAGNOSIS — Z79899 Other long term (current) drug therapy: Secondary | ICD-10-CM | POA: Diagnosis not present

## 2014-08-05 DIAGNOSIS — R0789 Other chest pain: Secondary | ICD-10-CM | POA: Diagnosis not present

## 2014-08-05 DIAGNOSIS — F419 Anxiety disorder, unspecified: Secondary | ICD-10-CM | POA: Diagnosis not present

## 2014-08-05 DIAGNOSIS — K5900 Constipation, unspecified: Secondary | ICD-10-CM | POA: Diagnosis not present

## 2014-08-05 DIAGNOSIS — M810 Age-related osteoporosis without current pathological fracture: Secondary | ICD-10-CM | POA: Diagnosis not present

## 2014-08-05 DIAGNOSIS — K635 Polyp of colon: Secondary | ICD-10-CM | POA: Diagnosis not present

## 2014-08-05 DIAGNOSIS — Z85118 Personal history of other malignant neoplasm of bronchus and lung: Secondary | ICD-10-CM | POA: Diagnosis not present

## 2014-08-05 DIAGNOSIS — E780 Pure hypercholesterolemia: Secondary | ICD-10-CM | POA: Diagnosis not present

## 2014-08-09 DIAGNOSIS — K5909 Other constipation: Secondary | ICD-10-CM | POA: Diagnosis not present

## 2014-08-09 DIAGNOSIS — Z86010 Personal history of colonic polyps: Secondary | ICD-10-CM | POA: Diagnosis not present

## 2014-08-10 ENCOUNTER — Other Ambulatory Visit: Payer: Self-pay | Admitting: Thoracic Surgery (Cardiothoracic Vascular Surgery)

## 2014-08-10 DIAGNOSIS — C3490 Malignant neoplasm of unspecified part of unspecified bronchus or lung: Secondary | ICD-10-CM

## 2014-08-23 DIAGNOSIS — K5900 Constipation, unspecified: Secondary | ICD-10-CM | POA: Diagnosis not present

## 2014-09-14 ENCOUNTER — Encounter: Payer: Self-pay | Admitting: Thoracic Surgery (Cardiothoracic Vascular Surgery)

## 2014-09-14 ENCOUNTER — Ambulatory Visit
Admission: RE | Admit: 2014-09-14 | Discharge: 2014-09-14 | Disposition: A | Discharge status: Home / Self Care | Payer: Medicare Other | Source: Ambulatory Visit | Attending: Thoracic Surgery (Cardiothoracic Vascular Surgery) | Admitting: Thoracic Surgery (Cardiothoracic Vascular Surgery)

## 2014-09-14 ENCOUNTER — Ambulatory Visit (INDEPENDENT_AMBULATORY_CARE_PROVIDER_SITE_OTHER): Payer: Medicare Other | Admitting: Thoracic Surgery (Cardiothoracic Vascular Surgery)

## 2014-09-14 VITALS — BP 132/61 | HR 72 | Resp 16 | Ht 68.0 in | Wt 146.0 lb

## 2014-09-14 DIAGNOSIS — Z85118 Personal history of other malignant neoplasm of bronchus and lung: Secondary | ICD-10-CM

## 2014-09-14 DIAGNOSIS — C3490 Malignant neoplasm of unspecified part of unspecified bronchus or lung: Secondary | ICD-10-CM

## 2014-09-14 DIAGNOSIS — Z853 Personal history of malignant neoplasm of breast: Secondary | ICD-10-CM | POA: Diagnosis not present

## 2014-09-14 DIAGNOSIS — Z902 Acquired absence of lung [part of]: Secondary | ICD-10-CM

## 2014-09-14 DIAGNOSIS — J432 Centrilobular emphysema: Secondary | ICD-10-CM | POA: Diagnosis not present

## 2014-09-14 DIAGNOSIS — Z9889 Other specified postprocedural states: Secondary | ICD-10-CM

## 2014-09-14 DIAGNOSIS — I2510 Atherosclerotic heart disease of native coronary artery without angina pectoris: Secondary | ICD-10-CM | POA: Diagnosis not present

## 2014-09-14 NOTE — Progress Notes (Signed)
[address]       [place] [address]             [phone]       HPI:  [name] returns today for a scheduled 6 month follow-up visit  She is a 74 year old woman who had a right lower lobe superior segmentectomy in October 2014 for stage IA non-small cell carcinoma. Her last office visit was in November 2015. At that time she was doing well with no evidence of recurrent disease.  In the interim since her last visit she's continued to do well. She does complain of arthritis, which is a chronic problem for her. She's not having any chest pain or shortness of breath. Her weight is stable. Her appetite is good. She's not had any unusual headaches or visual changes. She remains fairly active.  Past Medical History  Diagnosis Date  . Hyperplastic polyps of stomach   . Osteoporosis   . H/O vitamin D deficiency   . Hypercholesteremia   . Joint pain   . GERD (gastroesophageal reflux disease)   . Arthritis   . Non-small cell cancer of right lung 01/2013    Stage IA non small cell      Current Outpatient Prescriptions  Medication Sig Dispense Refill  . atorvastatin (LIPITOR) 10 MG tablet Take 10 mg by mouth daily. Takes M-W-F    . Cholecalciferol (VITAMIN D) 2000 UNITS tablet Take 1,000 Units by mouth daily.     . hydroxypropyl methylcellulose (ISOPTO TEARS) 2.5 % ophthalmic solution Place 1 drop into both eyes daily.    Marland Kitchen ibuprofen (ADVIL,MOTRIN) 200 MG tablet Take 200-400 mg by mouth every 8 (eight) hours as needed for pain or headache.    . meclizine (ANTIVERT) 25 MG tablet Take 25 mg by mouth 3 (three) times daily as needed for dizziness.     . meloxicam (MOBIC) 15 MG tablet Take 15 mg by mouth daily as needed for pain.     . Multiple Vitamin (MULTIVITAMIN WITH MINERALS) TABS tablet Take 1 tablet by mouth See admin instructions. Takes daily on mondays, wednesdays, fridays, and saturdays    . Omega-3 Krill Oil 500 MG CAPS Take 1 capsule by mouth daily.      . Omeprazole Magnesium (PRILOSEC OTC PO) Take 1 tablet by mouth daily as needed (reflux).    . Probiotic Product (PROBIOTIC DAILY) CAPS Take 1 capsule by mouth daily.    . raloxifene (EVISTA) 60 MG tablet Take 60 mg by mouth See admin instructions. Takes '60mg'$  on mondays, wednesdays, fridays, saturdays     No current facility-administered medications for this visit.    Physical Exam BP 132/61 mmHg  Pulse 72  Resp 16  Ht '5\' 8"'$  (1.727 m)  Wt 146 lb (66.225 kg)  BMI 22.20 kg/m2  SpO61 36% 74 year old woman in no acute distress Alert and oriented 3 with no focal deficits Cardiac regular rate and rhythm normal S1 and S2 Lungs clear with equal size bilaterally No cervical or subclavicular adenopathy  Diagnostic Tests: CT CHEST WITHOUT CONTRAST  TECHNIQUE: Multidetector CT imaging of the chest was performed following the standard protocol without IV contrast.  COMPARISON: Chest CT 02/23/2014.  FINDINGS: Mediastinum/Lymph Nodes: Heart size is normal. There is no significant pericardial fluid, thickening or pericardial calcification. There is atherosclerosis of the thoracic aorta, the great vessels of the mediastinum and the coronary arteries, including calcified atherosclerotic plaque in the left main, left anterior descending, left circumflex and  right coronary arteries. No pathologically enlarged mediastinal or hilar lymph nodes. Please note that accurate exclusion of hilar adenopathy is limited on noncontrast CT scans. Several densely calcified mediastinal lymph nodes are incidentally noted. Esophagus is unremarkable in appearance. No axillary lymphadenopathy.  Lungs/Pleura: Postoperative changes of wedge resection are noted in the right lower lobe. There is no soft tissue nodule or mass along the suture line to suggest local recurrence of disease. No suspicious appearing pulmonary nodules or masses are noted elsewhere in the lungs. No acute consolidative airspace  disease. No pleural effusions. Moderate centrilobular emphysema.  Upper Abdomen: Small calcified granulomas in the liver and spleen.  Musculoskeletal/Soft Tissues: There are no aggressive appearing lytic or blastic lesions noted in the visualized portions of the skeleton.  IMPRESSION: 1. Status post right lower lobe wedge resection, without findings to suggest local recurrence of disease or metastatic disease in the thorax. 2. Atherosclerosis, including left main and 3 vessel coronary artery disease. Assessment for potential risk factor modification, dietary therapy or pharmacologic therapy may be warranted, if clinically indicated. 3. Moderate centrilobular emphysema. 50. Sequela of old granulomatous disease, as above.   Electronically Signed  By: Vinnie Langton M.D.  On: 09/14/2014 15:46           Vitals     Height Weight BMI (Calculated)    '5\' 8"'$  (1.727 m) 146 lb (66.225 kg) 22.2      Interpretation Summary     CLINICAL DATA: 74 year old female with history of right lower lobe lung cancer diagnosed in October 2014 status post resection. Additional history of bilateral breast cancer. Former smoker.  EXAM: CT CHEST WITHOUT CONTRAST  TECHNIQUE: Multidetector CT imaging of the chest was performed following the standard protocol without IV contrast.  COMPARISON: Chest CT 02/23/2014.  FINDINGS: Mediastinum/Lymph Nodes: Heart size is normal. There is no significant pericardial fluid, thickening or pericardial calcification. There is atherosclerosis of the thoracic aorta, the great vessels of the mediastinum and the coronary arteries, including calcified atherosclerotic plaque in the left main, left anterior descending, left circumflex and right coronary arteries. No pathologically enlarged mediastinal or hilar lymph nodes. Please note that accurate exclusion of hilar adenopathy is limited on noncontrast CT scans. Several densely calcified  mediastinal lymph nodes are incidentally noted. Esophagus is unremarkable in appearance. No axillary lymphadenopathy.  Lungs/Pleura: Postoperative changes of wedge resection are noted in the right lower lobe. There is no soft tissue nodule or mass along the suture line to suggest local recurrence of disease. No suspicious appearing pulmonary nodules or masses are noted elsewhere in the lungs. No acute consolidative airspace disease. No pleural effusions. Moderate centrilobular emphysema.  Upper Abdomen: Small calcified granulomas in the liver and spleen.  Musculoskeletal/Soft Tissues: There are no aggressive appearing lytic or blastic lesions noted in the visualized portions of the skeleton.  IMPRESSION: 1. Status post right lower lobe wedge resection, without findings to suggest local recurrence of disease or metastatic disease in the thorax. 2. Atherosclerosis, including left main and 3 vessel coronary artery disease. Assessment for potential risk factor modification, dietary therapy or pharmacologic therapy may be warranted, if clinically indicated. 3. Moderate centrilobular emphysema. 40. Sequela of old granulomatous disease, as above.   Electronically Signed  By: Vinnie Langton M.D.  On: 09/14/2014 15:46     Impression: 74 year old woman who is now 18 months out from a right lower lobe superior segmentectomy for stage IA non-small cell carcinoma. She has no evidence recurrent disease.  I will plan to see her back  in 6 months for her 2 year follow-up visit. We'll do another CT then. After that if there are no suspicious findings will plan to scan her on an annual basis thereafter.  Plan: Return in 6 months with CT of chest for 2 year follow-up visit  Melrose Nakayama, MD [facility] [phone]

## 2014-10-11 ENCOUNTER — Other Ambulatory Visit: Payer: Self-pay

## 2014-11-10 ENCOUNTER — Other Ambulatory Visit: Payer: Self-pay | Admitting: Obstetrics and Gynecology

## 2014-11-10 ENCOUNTER — Other Ambulatory Visit: Payer: Self-pay

## 2014-11-10 DIAGNOSIS — M810 Age-related osteoporosis without current pathological fracture: Secondary | ICD-10-CM

## 2014-11-10 DIAGNOSIS — Z1231 Encounter for screening mammogram for malignant neoplasm of breast: Secondary | ICD-10-CM

## 2014-12-02 DIAGNOSIS — M2550 Pain in unspecified joint: Secondary | ICD-10-CM | POA: Diagnosis not present

## 2014-12-02 DIAGNOSIS — M791 Myalgia: Secondary | ICD-10-CM | POA: Diagnosis not present

## 2014-12-02 DIAGNOSIS — M353 Polymyalgia rheumatica: Secondary | ICD-10-CM | POA: Diagnosis not present

## 2014-12-13 DIAGNOSIS — Z6823 Body mass index (BMI) 23.0-23.9, adult: Secondary | ICD-10-CM | POA: Diagnosis not present

## 2014-12-13 DIAGNOSIS — Z124 Encounter for screening for malignant neoplasm of cervix: Secondary | ICD-10-CM | POA: Diagnosis not present

## 2014-12-13 DIAGNOSIS — Z01419 Encounter for gynecological examination (general) (routine) without abnormal findings: Secondary | ICD-10-CM | POA: Diagnosis not present

## 2014-12-13 DIAGNOSIS — M810 Age-related osteoporosis without current pathological fracture: Secondary | ICD-10-CM | POA: Diagnosis not present

## 2014-12-17 ENCOUNTER — Ambulatory Visit: Payer: Medicare Other

## 2014-12-17 ENCOUNTER — Other Ambulatory Visit: Payer: Medicare Other

## 2014-12-23 DIAGNOSIS — Z961 Presence of intraocular lens: Secondary | ICD-10-CM | POA: Diagnosis not present

## 2014-12-28 DIAGNOSIS — Z23 Encounter for immunization: Secondary | ICD-10-CM | POA: Diagnosis not present

## 2014-12-29 ENCOUNTER — Ambulatory Visit (INDEPENDENT_AMBULATORY_CARE_PROVIDER_SITE_OTHER): Payer: Medicare Other | Admitting: Podiatry

## 2014-12-29 ENCOUNTER — Encounter: Payer: Self-pay | Admitting: Podiatry

## 2014-12-29 VITALS — BP 144/71 | HR 79 | Resp 16

## 2014-12-29 DIAGNOSIS — S92301A Fracture of unspecified metatarsal bone(s), right foot, initial encounter for closed fracture: Secondary | ICD-10-CM

## 2014-12-29 NOTE — Progress Notes (Signed)
Subjective:     Patient ID: [name], female   DOB: 01-24-1941, 74 y.o.   MRN: [id number]  HPIThis patient presents to office saying her right foot developed severe pain and swelling about 2 weeks ago. She says she rested and the pain and swelling has diminished.  She says the pain was so severe she even had calf pain to develop due to her forefoot pain right foot.  She has no history of trauma.   Review of Systems     Objective:   Physical Exam GENERAL APPEARANCE: Alert, conversant. Appropriately groomed. No acute distress.  VASCULAR: Pedal pulses palpable at  [facility] and PT bilateral.  Capillary refill time is immediate to all digits,  Normal temperature gradient.  Digital hair growth is present bilateral  NEUROLOGIC: sensation is normal to 5.07 monofilament at 5/5 sites bilateral.  Light touch is intact bilateral, Muscle strength normal.  MUSCULOSKELETAL: acceptable muscle strength, tone and stability bilateral.  Intrinsic muscluature intact bilateral.  Rectus appearance of foot and digits noted bilateral. Palpable pain through her second and third metatarsal shafts .  She also has palpable pain through her second intermetatarsal space.  DERMATOLOGIC: skin color, texture, and turgor are within normal limits.  No preulcerative lesions or ulcers  are seen, no interdigital maceration noted.  No open lesions present.  Digital nails are asymptomatic. No drainage noted.      Assessment:     Stress fracture 2 right     Plan:    IE  Xray 2 wiews revealing no evidence of fracture.  Treated her with elastic sock for immobilization and compression.  Treated with surgical shoe right foot.

## 2015-01-11 ENCOUNTER — Other Ambulatory Visit: Payer: Medicare Other

## 2015-01-11 ENCOUNTER — Ambulatory Visit: Payer: Medicare Other

## 2015-01-26 ENCOUNTER — Encounter: Payer: Self-pay | Admitting: Podiatry

## 2015-01-26 ENCOUNTER — Ambulatory Visit (INDEPENDENT_AMBULATORY_CARE_PROVIDER_SITE_OTHER): Payer: Medicare Other | Admitting: Podiatry

## 2015-01-26 DIAGNOSIS — S92301D Fracture of unspecified metatarsal bone(s), right foot, subsequent encounter for fracture with routine healing: Secondary | ICD-10-CM | POA: Diagnosis not present

## 2015-01-26 NOTE — Progress Notes (Signed)
Subjective:     Patient ID: [name], female   DOB: Aug 28, 1940, 74 y.o.   MRN: [id number]  HPIThis patient returns to the office with diagnosis of stress fracture.  She was given an elastic sock and surgical shoes and told to wear them until her follow up visit.  She says the soak was too short and her foot was moving in her shoe due to the narrowness of the shoe.  She says her pain is still present but has good and bad days.  She says she is walking to protect her right foot which is swollen on top of right foot compared to left foot.   Review of Systems     Objective:   Physical Exam.    Physical Exam GENERAL APPEARANCE: Alert, conversant. Appropriately groomed. No acute distress.  VASCULAR: Pedal pulses palpable at [facility] and PT bilateral. Capillary refill time is immediate to all digits, Normal temperature gradient. Digital hair growth is present bilateral  NEUROLOGIC: sensation is normal to 5.07 monofilament at 5/5 sites bilateral. Light touch is intact bilateral, Muscle strength normal.  MUSCULOSKELETAL: acceptable muscle strength, tone and stability bilateral. Intrinsic muscluature intact bilateral. Rectus appearance of foot and digits noted bilateral. Palpable pain through her second and third metatarsal shafts . She also has palpable pain through her second intermetatarsal space. There is palpable pain second metatarsal shaft.  DERMATOLOGIC: skin color, texture, and turgor are within normal limits. No preulcerative lesions or ulcers are seen, no interdigital maceration noted. No open lesions present. Digital nails are asymptomatic. No drainage noted.            Assessment:     Closed metatarsal fracture right foot.                Plan:     ROV  After talking to this patient she is healing despite not wearing sock and shoes.  She has swelling on top of right foot but no increased redness or temperature. The x-rays are not functional so I suggested she needs  an x-ray from [facility] and she was reluctant since she was improving.  She did not want medication or injection therapy.  At this juncture she is improving but I feel we need this condition declare itself.  RTC prn

## 2015-02-07 ENCOUNTER — Ambulatory Visit
Admission: RE | Admit: 2015-02-07 | Discharge: 2015-02-07 | Disposition: A | Discharge status: Home / Self Care | Payer: Medicare Other | Source: Ambulatory Visit

## 2015-02-07 ENCOUNTER — Ambulatory Visit
Admission: RE | Admit: 2015-02-07 | Discharge: 2015-02-07 | Disposition: A | Discharge status: Home / Self Care | Payer: Medicare Other | Source: Ambulatory Visit | Attending: Obstetrics and Gynecology | Admitting: Obstetrics and Gynecology

## 2015-02-07 DIAGNOSIS — Z1231 Encounter for screening mammogram for malignant neoplasm of breast: Secondary | ICD-10-CM | POA: Diagnosis not present

## 2015-02-07 DIAGNOSIS — M810 Age-related osteoporosis without current pathological fracture: Secondary | ICD-10-CM

## 2015-02-11 ENCOUNTER — Other Ambulatory Visit: Payer: Self-pay | Admitting: Thoracic Surgery (Cardiothoracic Vascular Surgery)

## 2015-02-11 DIAGNOSIS — C3490 Malignant neoplasm of unspecified part of unspecified bronchus or lung: Secondary | ICD-10-CM

## 2015-02-15 ENCOUNTER — Ambulatory Visit
Admission: RE | Admit: 2015-02-15 | Discharge: 2015-02-15 | Disposition: A | Discharge status: Home / Self Care | Payer: Medicare Other | Source: Ambulatory Visit | Attending: Obstetrics and Gynecology | Admitting: Obstetrics and Gynecology

## 2015-02-15 DIAGNOSIS — L820 Inflamed seborrheic keratosis: Secondary | ICD-10-CM | POA: Diagnosis not present

## 2015-02-15 DIAGNOSIS — L821 Other seborrheic keratosis: Secondary | ICD-10-CM | POA: Diagnosis not present

## 2015-02-15 DIAGNOSIS — L723 Sebaceous cyst: Secondary | ICD-10-CM | POA: Diagnosis not present

## 2015-02-15 DIAGNOSIS — D225 Melanocytic nevi of trunk: Secondary | ICD-10-CM | POA: Diagnosis not present

## 2015-02-15 DIAGNOSIS — M810 Age-related osteoporosis without current pathological fracture: Secondary | ICD-10-CM | POA: Diagnosis not present

## 2015-02-15 DIAGNOSIS — L814 Other melanin hyperpigmentation: Secondary | ICD-10-CM | POA: Diagnosis not present

## 2015-02-15 DIAGNOSIS — D1800 Hemangioma unspecified site: Secondary | ICD-10-CM | POA: Diagnosis not present

## 2015-03-01 ENCOUNTER — Ambulatory Visit (INDEPENDENT_AMBULATORY_CARE_PROVIDER_SITE_OTHER): Payer: Medicare Other | Admitting: Thoracic Surgery (Cardiothoracic Vascular Surgery)

## 2015-03-01 ENCOUNTER — Encounter: Payer: Self-pay | Admitting: Thoracic Surgery (Cardiothoracic Vascular Surgery)

## 2015-03-01 ENCOUNTER — Ambulatory Visit
Admission: RE | Admit: 2015-03-01 | Discharge: 2015-03-01 | Disposition: A | Discharge status: Home / Self Care | Payer: Medicare Other | Source: Ambulatory Visit | Attending: Thoracic Surgery (Cardiothoracic Vascular Surgery) | Admitting: Thoracic Surgery (Cardiothoracic Vascular Surgery)

## 2015-03-01 VITALS — BP 121/56 | HR 71 | Resp 16 | Ht 68.0 in | Wt 150.0 lb

## 2015-03-01 DIAGNOSIS — C3491 Malignant neoplasm of unspecified part of right bronchus or lung: Secondary | ICD-10-CM | POA: Diagnosis not present

## 2015-03-01 DIAGNOSIS — C3431 Malignant neoplasm of lower lobe, right bronchus or lung: Secondary | ICD-10-CM | POA: Diagnosis not present

## 2015-03-01 DIAGNOSIS — Z902 Acquired absence of lung [part of]: Secondary | ICD-10-CM

## 2015-03-01 DIAGNOSIS — C3490 Malignant neoplasm of unspecified part of unspecified bronchus or lung: Secondary | ICD-10-CM

## 2015-03-01 NOTE — Progress Notes (Signed)
[address]       [place] [address]             [phone]       HPI: [name] returns today for a scheduled 2 year follow-up visit.  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 Her last office visit was in May 2016. At that time she was doing well with no evidence of recurrent disease.  In the interim since her last visit she continues to do well. She does complain of arthritis, which is unchanged since her last visit.. She denies any chest pain, pressure or tightness. She has not had any wheezing, unusual cough, or shortness of breath. She thinks she has gained a little weight. Her appetite is good. Denies unusual headaches or visual changes. She remains fairly active.  She was concerned after reading her CT report commenting on plaque in her coronary arteries.  Past Medical History  Diagnosis Date  . Hyperplastic polyps of stomach   . Osteoporosis   . H/O vitamin D deficiency   . Hypercholesteremia   . Joint pain   . GERD (gastroesophageal reflux disease)   . Arthritis   . Non-small cell cancer of right lung ([place]) 01/2013    Stage IA non small cell      Current Outpatient Prescriptions  Medication Sig Dispense Refill  . atorvastatin (LIPITOR) 10 MG tablet Take 10 mg by mouth daily. Takes M-W-F    . Cholecalciferol (VITAMIN D) 2000 UNITS tablet Take 1,000 Units by mouth daily.     . cyclobenzaprine (FLEXERIL) 10 MG tablet TAKE 1/2 TO 1 TABLET 3 TIMES A DAY AS NEEDED  2  . hydroxypropyl methylcellulose (ISOPTO TEARS) 2.5 % ophthalmic solution Place 1 drop into both eyes daily.    Marland Kitchen ibuprofen (ADVIL,MOTRIN) 200 MG tablet Take 200-400 mg by mouth every 8 (eight) hours as needed for pain or headache.    . meclizine (ANTIVERT) 25 MG tablet Take 25 mg by mouth 3 (three) times daily as needed for dizziness.     . meloxicam  (MOBIC) 15 MG tablet Take 15 mg by mouth daily as needed for pain.     . Multiple Vitamin (MULTIVITAMIN WITH MINERALS) TABS tablet Take 1 tablet by mouth See admin instructions. Takes daily on mondays, wednesdays, fridays, and saturdays    . Omega-3 Krill Oil 500 MG CAPS Take 1 capsule by mouth daily.    . Omeprazole Magnesium (PRILOSEC OTC PO) Take 1 tablet by mouth daily as needed (reflux).    . Probiotic Product (PROBIOTIC DAILY) CAPS Take 1 capsule by mouth daily.    . raloxifene (EVISTA) 60 MG tablet Take 60 mg by mouth See admin instructions. Takes '60mg'$  on mondays, wednesdays, fridays, saturdays     No current facility-administered medications for this visit.    Physical Exam BP 121/56 mmHg  Pulse 71  Resp 16  Ht '5\' 8"'$  (1.727 m)  Wt 150 lb (68.04 kg)  BMI 22.81 kg/m2  SpO60 3% 74 year old woman in no acute distress Well-developed and well-nourished Alert and oriented 3 with no focal neurologic deficits No cervical or subclavicular adenopathy Cardiac regular rate and rhythm normal S1 and S2 Lungs clear with equal breath sounds bilaterally Incisions well healed  Diagnostic Tests: CT CHEST WITHOUT CONTRAST  TECHNIQUE: Multidetector CT imaging of the chest was performed following the  standard protocol without IV contrast.  COMPARISON: 09/14/2014  FINDINGS: Mediastinum/Nodes: Coronary, aortic arch, and branch vessel atherosclerotic vascular disease. Old granulomatous disease with calcified subcarinal lymph nodes. Heart size within normal limits. No pathologic thoracic adenopathy observed.  Lungs/Pleura: Centrilobular emphysema. Stable postoperative findings in the right lung with associated pleural thickening along the right lower lobe wedge resection margin, unchanged from prior. 3 mm nodule in the apical posterior segment left upper lobe, image 12 series 4, no change from 12/21/2009, considered benign.  Upper abdomen: Unremarkable  Musculoskeletal:  Unremarkable  IMPRESSION: 1. No recurrent malignancy identified. Postoperative findings in the right lung. 2. Coronary, aortic arch, and branch vessel atherosclerotic vascular disease. 3. Old granulomatous disease. 4. Centrilobular emphysema.   Electronically Signed  By: [name] M.D.  On: 03/01/2015 14:07  I personally reviewed the CT chest and concur with the findings as noted above.  Impression: 74 year old woman who is now 2 years out from a lower lobe superior segmentectomy for an early stage (1A) well-differentiated adenocarcinoma. She has no evidence of recurrent disease. Her risk for recurrence is quite small.  She questions about findings of atherosclerotic vascular disease on her scans. I reassured her that this is a common finding in people her age on CT scans. She does not have any anginal symptoms. She is on appropriate medical therapy with Lipitor. Blood pressure is normal.  Plan: Return in one year with CT of chest  Melinda Nakayama, MD [facility] [phone]

## 2015-08-11 ENCOUNTER — Encounter: Payer: Self-pay | Admitting: Gastroenterology

## 2015-08-26 DIAGNOSIS — E7800 Pure hypercholesterolemia, unspecified: Secondary | ICD-10-CM | POA: Diagnosis not present

## 2015-08-26 DIAGNOSIS — R202 Paresthesia of skin: Secondary | ICD-10-CM | POA: Diagnosis not present

## 2015-08-26 DIAGNOSIS — Z85118 Personal history of other malignant neoplasm of bronchus and lung: Secondary | ICD-10-CM | POA: Diagnosis not present

## 2015-08-26 DIAGNOSIS — Z79899 Other long term (current) drug therapy: Secondary | ICD-10-CM | POA: Diagnosis not present

## 2015-08-26 DIAGNOSIS — L659 Nonscarring hair loss, unspecified: Secondary | ICD-10-CM | POA: Diagnosis not present

## 2015-08-26 DIAGNOSIS — K5900 Constipation, unspecified: Secondary | ICD-10-CM | POA: Diagnosis not present

## 2015-08-26 DIAGNOSIS — E559 Vitamin D deficiency, unspecified: Secondary | ICD-10-CM | POA: Diagnosis not present

## 2015-08-26 DIAGNOSIS — F419 Anxiety disorder, unspecified: Secondary | ICD-10-CM | POA: Diagnosis not present

## 2015-11-28 DIAGNOSIS — R0981 Nasal congestion: Secondary | ICD-10-CM | POA: Diagnosis not present

## 2015-12-02 DIAGNOSIS — M2550 Pain in unspecified joint: Secondary | ICD-10-CM | POA: Diagnosis not present

## 2015-12-02 DIAGNOSIS — Z79899 Other long term (current) drug therapy: Secondary | ICD-10-CM | POA: Diagnosis not present

## 2015-12-02 DIAGNOSIS — M353 Polymyalgia rheumatica: Secondary | ICD-10-CM | POA: Diagnosis not present

## 2015-12-26 DIAGNOSIS — R51 Headache: Secondary | ICD-10-CM | POA: Diagnosis not present

## 2015-12-26 DIAGNOSIS — R42 Dizziness and giddiness: Secondary | ICD-10-CM | POA: Diagnosis not present

## 2015-12-27 ENCOUNTER — Other Ambulatory Visit: Payer: Self-pay | Admitting: Family Medicine

## 2015-12-27 DIAGNOSIS — R51 Headache: Principal | ICD-10-CM

## 2015-12-27 DIAGNOSIS — R519 Headache, unspecified: Secondary | ICD-10-CM

## 2016-01-02 ENCOUNTER — Ambulatory Visit
Admission: RE | Admit: 2016-01-02 | Discharge: 2016-01-02 | Disposition: A | Discharge status: Home / Self Care | Payer: Medicare Other | Source: Ambulatory Visit | Attending: Family Medicine | Admitting: Family Medicine

## 2016-01-02 DIAGNOSIS — R51 Headache: Principal | ICD-10-CM

## 2016-01-02 DIAGNOSIS — C3490 Malignant neoplasm of unspecified part of unspecified bronchus or lung: Secondary | ICD-10-CM | POA: Diagnosis not present

## 2016-01-02 DIAGNOSIS — R42 Dizziness and giddiness: Secondary | ICD-10-CM | POA: Diagnosis not present

## 2016-01-02 DIAGNOSIS — R519 Headache, unspecified: Secondary | ICD-10-CM

## 2016-01-02 MED ORDER — IOPAMIDOL (ISOVUE-300) INJECTION 61%
75.0000 mL | Freq: Once | INTRAVENOUS | Status: AC | PRN
  Administered 2016-01-02: 75 mL via INTRAVENOUS

## 2016-01-05 DIAGNOSIS — Z6822 Body mass index (BMI) 22.0-22.9, adult: Secondary | ICD-10-CM | POA: Diagnosis not present

## 2016-01-05 DIAGNOSIS — Z01419 Encounter for gynecological examination (general) (routine) without abnormal findings: Secondary | ICD-10-CM | POA: Diagnosis not present

## 2016-01-05 DIAGNOSIS — Z1389 Encounter for screening for other disorder: Secondary | ICD-10-CM | POA: Diagnosis not present

## 2016-01-05 DIAGNOSIS — M810 Age-related osteoporosis without current pathological fracture: Secondary | ICD-10-CM | POA: Diagnosis not present

## 2016-01-23 ENCOUNTER — Other Ambulatory Visit: Payer: Self-pay | Admitting: Obstetrics and Gynecology

## 2016-01-23 DIAGNOSIS — Z1231 Encounter for screening mammogram for malignant neoplasm of breast: Secondary | ICD-10-CM

## 2016-02-01 ENCOUNTER — Other Ambulatory Visit: Payer: Self-pay | Admitting: *Deleted

## 2016-02-01 DIAGNOSIS — Z85118 Personal history of other malignant neoplasm of bronchus and lung: Secondary | ICD-10-CM

## 2016-02-10 ENCOUNTER — Ambulatory Visit
Admission: RE | Admit: 2016-02-10 | Discharge: 2016-02-10 | Disposition: A | Discharge status: Home / Self Care | Payer: Medicare Other | Source: Ambulatory Visit | Attending: Obstetrics and Gynecology | Admitting: Obstetrics and Gynecology

## 2016-02-10 DIAGNOSIS — Z1231 Encounter for screening mammogram for malignant neoplasm of breast: Secondary | ICD-10-CM

## 2016-02-16 DIAGNOSIS — D225 Melanocytic nevi of trunk: Secondary | ICD-10-CM | POA: Diagnosis not present

## 2016-02-16 DIAGNOSIS — D1800 Hemangioma unspecified site: Secondary | ICD-10-CM | POA: Diagnosis not present

## 2016-02-16 DIAGNOSIS — L820 Inflamed seborrheic keratosis: Secondary | ICD-10-CM | POA: Diagnosis not present

## 2016-02-16 DIAGNOSIS — L821 Other seborrheic keratosis: Secondary | ICD-10-CM | POA: Diagnosis not present

## 2016-02-16 DIAGNOSIS — L814 Other melanin hyperpigmentation: Secondary | ICD-10-CM | POA: Diagnosis not present

## 2016-02-16 DIAGNOSIS — Z23 Encounter for immunization: Secondary | ICD-10-CM | POA: Diagnosis not present

## 2016-03-13 ENCOUNTER — Ambulatory Visit (INDEPENDENT_AMBULATORY_CARE_PROVIDER_SITE_OTHER): Payer: Medicare Other | Admitting: Thoracic Surgery (Cardiothoracic Vascular Surgery)

## 2016-03-13 ENCOUNTER — Ambulatory Visit
Admission: RE | Admit: 2016-03-13 | Discharge: 2016-03-13 | Disposition: A | Discharge status: Home / Self Care | Payer: Medicare Other | Source: Ambulatory Visit | Attending: Thoracic Surgery (Cardiothoracic Vascular Surgery) | Admitting: Thoracic Surgery (Cardiothoracic Vascular Surgery)

## 2016-03-13 ENCOUNTER — Encounter: Payer: Self-pay | Admitting: Thoracic Surgery (Cardiothoracic Vascular Surgery)

## 2016-03-13 VITALS — BP 120/50 | HR 70 | Resp 16 | Ht 68.0 in | Wt 150.0 lb

## 2016-03-13 DIAGNOSIS — Z85118 Personal history of other malignant neoplasm of bronchus and lung: Secondary | ICD-10-CM

## 2016-03-13 DIAGNOSIS — Z902 Acquired absence of lung [part of]: Secondary | ICD-10-CM | POA: Diagnosis not present

## 2016-03-13 DIAGNOSIS — I2510 Atherosclerotic heart disease of native coronary artery without angina pectoris: Secondary | ICD-10-CM

## 2016-03-13 DIAGNOSIS — C3491 Malignant neoplasm of unspecified part of right bronchus or lung: Secondary | ICD-10-CM | POA: Diagnosis not present

## 2016-03-13 DIAGNOSIS — C3431 Malignant neoplasm of lower lobe, right bronchus or lung: Secondary | ICD-10-CM | POA: Diagnosis not present

## 2016-03-13 DIAGNOSIS — I700 Atherosclerosis of aorta: Secondary | ICD-10-CM | POA: Diagnosis not present

## 2016-03-13 NOTE — Progress Notes (Signed)
[address]       [place] [address]             [phone]       HPI: [name] returns for a scheduled 1 year follow-up visit.  Mrs. Blattner is a 75 year old woman who had a right lower lobe she appears segmentectomy for stage IA non-small cell carcinoma in October 2014. This was a well-differentiated adenocarcinoma. She does have a history of remote tobacco abuse and quit in 2007. I last saw her in November 2016 for her 2 year follow-up visit. She had no evidence of recurrent disease.  Over the past year she is done well. She says that she has not had any chest pain, pressure, or tightness. She get short of breath with heavy exertion but that has not changed. She does not have any wheezing, unusual cough, or shortness of breath with routine activities. She denies any unusual headaches or visual changes. She does have some arthritis pains which are unchanged. She denies any change in appetite or weight loss.  Past Medical History:  Diagnosis Date  . Arthritis   . GERD (gastroesophageal reflux disease)   . H/O vitamin D deficiency   . Hypercholesteremia   . Hyperplastic polyps of stomach   . Joint pain   . Non-small cell cancer of right lung ([place]) 01/2013   Stage IA non small cell  . Osteoporosis      Current Outpatient Prescriptions  Medication Sig Dispense Refill  . atorvastatin (LIPITOR) 10 MG tablet Take 10 mg by mouth daily. Takes M-W-F    . Cholecalciferol (VITAMIN D) 2000 UNITS tablet Take 1,000 Units by mouth daily.     . hydroxypropyl methylcellulose (ISOPTO TEARS) 2.5 % ophthalmic solution Place 1 drop into both eyes daily.    Marland Kitchen ibuprofen (ADVIL,MOTRIN) 200 MG tablet Take 200-400 mg by mouth every 8 (eight) hours as needed for pain or headache.    . meclizine (ANTIVERT) 25 MG tablet Take 25 mg by mouth 3 (three) times daily as needed for dizziness.     . meloxicam (MOBIC) 15 MG tablet Take 15 mg by mouth daily as needed for pain.     . Multiple  Vitamin (MULTIVITAMIN WITH MINERALS) TABS tablet Take 1 tablet by mouth See admin instructions. Takes daily on mondays, wednesdays, fridays, and saturdays    . Omega-3 Krill Oil 500 MG CAPS Take 1 capsule by mouth daily.    . Omeprazole Magnesium (PRILOSEC OTC PO) Take 1 tablet by mouth daily as needed (reflux).    . Probiotic Product (PROBIOTIC DAILY) CAPS Take 1 capsule by mouth daily.    . raloxifene (EVISTA) 60 MG tablet Take 60 mg by mouth See admin instructions. Takes '60mg'$  on mondays, wednesdays, fridays, saturdays    . cyclobenzaprine (FLEXERIL) 10 MG tablet TAKE 1/2 TO 1 TABLET 3 TIMES A DAY AS NEEDED  2   No current facility-administered medications for this visit.     Physical Exam BP (!) 120/50   Pulse 70   Resp 16   Ht '5\' 8"'$  (1.727 m)   Wt 150 lb (68 kg)   BMI 22.52 kg/m  75 year old woman in no acute distress Alert and oriented 3 with no focal deficits No cervical or supraclavicular adenopathy Cardiac regular rate and rhythm normal S1 and S2 Lungs clear with equal breath sounds bilaterally No clubbing  Diagnostic Tests: CT CHEST WITHOUT CONTRAST  TECHNIQUE: Multidetector CT imaging of the chest was  performed following the standard protocol without IV contrast.  COMPARISON:  03/01/2015  FINDINGS: Cardiovascular: No acute findings. Aortic atherosclerosis. Coronary artery calcification.  Mediastinum/Nodes: No masses or pathologically enlarged lymph nodes identified on this unenhanced exam.  Lungs/Pleura: Moderate emphysema again demonstrated. Stable postsurgical changes seen in the right middle and lower lobes. No suspicious pulmonary nodules or masses identified. 3 mm nodule in the left lung apex on image 23/4 remains stable. No evidence of pulmonary infiltrate or pleural effusion.  Upper Abdomen:  Unremarkable.  Normal adrenal glands.  Musculoskeletal: No suspicious bone lesions or other significant abnormality.  IMPRESSION: Stable  postsurgical changes in right lung. No evidence of recurrent or metastatic carcinoma within the thorax.  Moderate emphysema.  Aortic and coronary artery atherosclerosis.   Electronically Signed   By: Earle Gell M.D.   On: 03/13/2016 10:37 I personally reviewed the CT chest and concur with the findings noted above.  Impression: 75 year old woman who is now 3 years out from a right lower lobe superior segmentectomy for a stage IA well-differentiated adenocarcinoma. She has no evidence recurrent disease. There is a small, 3 mm, nodule in the left upper lobe that is unchanged. This is likely benign.  Tobacco abuse- she quit smoking in 2007.  Coronary and aortic atherosclerosis- noted on CT. Asymptomatic. She is on a statin.  Plan: Return in one year with CT chest  Melrose Nakayama, MD [facility] [phone]

## 2016-09-04 DIAGNOSIS — F339 Major depressive disorder, recurrent, unspecified: Secondary | ICD-10-CM | POA: Diagnosis not present

## 2016-09-04 DIAGNOSIS — F419 Anxiety disorder, unspecified: Secondary | ICD-10-CM | POA: Diagnosis not present

## 2016-09-04 DIAGNOSIS — Z85118 Personal history of other malignant neoplasm of bronchus and lung: Secondary | ICD-10-CM | POA: Diagnosis not present

## 2016-09-04 DIAGNOSIS — K219 Gastro-esophageal reflux disease without esophagitis: Secondary | ICD-10-CM | POA: Diagnosis not present

## 2016-09-04 DIAGNOSIS — E7800 Pure hypercholesterolemia, unspecified: Secondary | ICD-10-CM | POA: Diagnosis not present

## 2016-09-04 DIAGNOSIS — E559 Vitamin D deficiency, unspecified: Secondary | ICD-10-CM | POA: Diagnosis not present

## 2016-09-04 DIAGNOSIS — F329 Major depressive disorder, single episode, unspecified: Secondary | ICD-10-CM | POA: Diagnosis not present

## 2016-09-04 DIAGNOSIS — R0981 Nasal congestion: Secondary | ICD-10-CM | POA: Diagnosis not present

## 2016-09-04 DIAGNOSIS — K5909 Other constipation: Secondary | ICD-10-CM | POA: Diagnosis not present

## 2016-09-17 DIAGNOSIS — Z036 Encounter for observation for suspected toxic effect from ingested substance ruled out: Secondary | ICD-10-CM | POA: Diagnosis not present

## 2016-11-01 DIAGNOSIS — M2669 Other specified disorders of temporomandibular joint: Secondary | ICD-10-CM | POA: Diagnosis not present

## 2016-11-23 DIAGNOSIS — M26629 Arthralgia of temporomandibular joint, unspecified side: Secondary | ICD-10-CM | POA: Diagnosis not present

## 2016-11-29 DIAGNOSIS — Z961 Presence of intraocular lens: Secondary | ICD-10-CM | POA: Diagnosis not present

## 2016-12-03 DIAGNOSIS — Z6823 Body mass index (BMI) 23.0-23.9, adult: Secondary | ICD-10-CM | POA: Diagnosis not present

## 2016-12-03 DIAGNOSIS — M2550 Pain in unspecified joint: Secondary | ICD-10-CM | POA: Diagnosis not present

## 2016-12-03 DIAGNOSIS — Z79899 Other long term (current) drug therapy: Secondary | ICD-10-CM | POA: Diagnosis not present

## 2016-12-03 DIAGNOSIS — M353 Polymyalgia rheumatica: Secondary | ICD-10-CM | POA: Diagnosis not present

## 2016-12-27 DIAGNOSIS — M26609 Unspecified temporomandibular joint disorder, unspecified side: Secondary | ICD-10-CM | POA: Diagnosis not present

## 2016-12-31 ENCOUNTER — Other Ambulatory Visit: Payer: Self-pay | Admitting: Obstetrics and Gynecology

## 2016-12-31 DIAGNOSIS — Z1231 Encounter for screening mammogram for malignant neoplasm of breast: Secondary | ICD-10-CM

## 2017-01-04 DIAGNOSIS — Z23 Encounter for immunization: Secondary | ICD-10-CM | POA: Diagnosis not present

## 2017-01-17 DIAGNOSIS — M810 Age-related osteoporosis without current pathological fracture: Secondary | ICD-10-CM | POA: Diagnosis not present

## 2017-01-17 DIAGNOSIS — Z6823 Body mass index (BMI) 23.0-23.9, adult: Secondary | ICD-10-CM | POA: Diagnosis not present

## 2017-01-17 DIAGNOSIS — Z01419 Encounter for gynecological examination (general) (routine) without abnormal findings: Secondary | ICD-10-CM | POA: Diagnosis not present

## 2017-01-18 ENCOUNTER — Other Ambulatory Visit: Payer: Self-pay | Admitting: Obstetrics and Gynecology

## 2017-01-18 DIAGNOSIS — M810 Age-related osteoporosis without current pathological fracture: Secondary | ICD-10-CM

## 2017-02-04 ENCOUNTER — Encounter: Payer: Self-pay | Admitting: Thoracic Surgery (Cardiothoracic Vascular Surgery)

## 2017-02-07 ENCOUNTER — Other Ambulatory Visit: Payer: Self-pay | Admitting: Thoracic Surgery (Cardiothoracic Vascular Surgery)

## 2017-02-07 DIAGNOSIS — C3491 Malignant neoplasm of unspecified part of right bronchus or lung: Secondary | ICD-10-CM

## 2017-02-11 ENCOUNTER — Ambulatory Visit: Payer: Medicare Other

## 2017-02-15 ENCOUNTER — Ambulatory Visit
Admission: RE | Admit: 2017-02-15 | Discharge: 2017-02-15 | Disposition: A | Discharge status: Home / Self Care | Payer: Medicare Other | Source: Ambulatory Visit | Attending: Obstetrics and Gynecology | Admitting: Obstetrics and Gynecology

## 2017-02-15 DIAGNOSIS — Z780 Asymptomatic menopausal state: Secondary | ICD-10-CM | POA: Diagnosis not present

## 2017-02-15 DIAGNOSIS — Z1231 Encounter for screening mammogram for malignant neoplasm of breast: Secondary | ICD-10-CM | POA: Diagnosis not present

## 2017-02-15 DIAGNOSIS — M810 Age-related osteoporosis without current pathological fracture: Secondary | ICD-10-CM

## 2017-02-26 DIAGNOSIS — L821 Other seborrheic keratosis: Secondary | ICD-10-CM | POA: Diagnosis not present

## 2017-02-26 DIAGNOSIS — D225 Melanocytic nevi of trunk: Secondary | ICD-10-CM | POA: Diagnosis not present

## 2017-02-26 DIAGNOSIS — L814 Other melanin hyperpigmentation: Secondary | ICD-10-CM | POA: Diagnosis not present

## 2017-02-26 DIAGNOSIS — L820 Inflamed seborrheic keratosis: Secondary | ICD-10-CM | POA: Diagnosis not present

## 2017-02-26 DIAGNOSIS — L570 Actinic keratosis: Secondary | ICD-10-CM | POA: Diagnosis not present

## 2017-02-26 DIAGNOSIS — D1800 Hemangioma unspecified site: Secondary | ICD-10-CM | POA: Diagnosis not present

## 2017-02-26 DIAGNOSIS — Z23 Encounter for immunization: Secondary | ICD-10-CM | POA: Diagnosis not present

## 2017-03-12 ENCOUNTER — Encounter: Payer: Self-pay | Admitting: Thoracic Surgery (Cardiothoracic Vascular Surgery)

## 2017-03-12 ENCOUNTER — Other Ambulatory Visit: Payer: Self-pay

## 2017-03-12 ENCOUNTER — Ambulatory Visit (INDEPENDENT_AMBULATORY_CARE_PROVIDER_SITE_OTHER): Payer: Medicare Other | Admitting: Thoracic Surgery (Cardiothoracic Vascular Surgery)

## 2017-03-12 ENCOUNTER — Ambulatory Visit
Admission: RE | Admit: 2017-03-12 | Discharge: 2017-03-12 | Disposition: A | Discharge status: Home / Self Care | Payer: Medicare Other | Source: Ambulatory Visit | Attending: Thoracic Surgery (Cardiothoracic Vascular Surgery) | Admitting: Thoracic Surgery (Cardiothoracic Vascular Surgery)

## 2017-03-12 VITALS — BP 133/65 | HR 69 | Ht 68.0 in | Wt 150.0 lb

## 2017-03-12 DIAGNOSIS — I700 Atherosclerosis of aorta: Secondary | ICD-10-CM | POA: Diagnosis not present

## 2017-03-12 DIAGNOSIS — C3491 Malignant neoplasm of unspecified part of right bronchus or lung: Secondary | ICD-10-CM

## 2017-03-12 DIAGNOSIS — J439 Emphysema, unspecified: Secondary | ICD-10-CM | POA: Diagnosis not present

## 2017-03-12 NOTE — Progress Notes (Signed)
[address]       [place] [address]             [phone]     HPI: [name] returns for scheduled annual follow-up visit.  She is a 76 year old woman who had a right lower lobe superior segmentectomy for stage IA adenocarcinoma in October 2014.  I last saw her in November 2017.  She was doing well at that time.  She has remote history of tobacco abuse.  She quit in 2007.  She says she has been doing well.  She does not have any chest pain, pressure, or tightness.  She gets short of breath with heavy exertion but nothing different than it has been in the past.  She has not had any unusual coughing.  She denies wheezing.  Her appetite is good and she denies any weight loss.  She does not have any unusual bone or joint pain has not had any unusual headaches or visual changes.  Past Medical History:  Diagnosis Date  . Arthritis   . GERD (gastroesophageal reflux disease)   . H/O vitamin D deficiency   . Hypercholesteremia   . Hyperplastic polyps of stomach   . Joint pain   . Non-small cell cancer of right lung ([place]) 01/2013   Stage IA non small cell  . Osteoporosis     Current Outpatient Medications  Medication Sig Dispense Refill  . ALPRAZolam (XANAX) 0.25 MG tablet As needed for flying    . atorvastatin (LIPITOR) 10 MG tablet Take 10 mg by mouth daily. Takes M-W-F    . Cholecalciferol (VITAMIN D) 2000 UNITS tablet Take 1,000 Units by mouth daily.     . cyclobenzaprine (FLEXERIL) 10 MG tablet TAKE 1/2 TO 1 TABLET 3 TIMES A DAY AS NEEDED  2  . hydroxypropyl methylcellulose (ISOPTO TEARS) 2.5 % ophthalmic solution Place 1 drop into both eyes daily.    Marland Kitchen ibuprofen (ADVIL,MOTRIN) 200 MG tablet Take 200-400 mg by mouth every 8 (eight) hours as needed for pain or headache.    . meclizine (ANTIVERT) 25 MG tablet Take 25 mg by mouth 3 (three) times daily as needed for dizziness.     . meloxicam (MOBIC) 15 MG tablet Take 15 mg by mouth daily as needed for pain.      . Multiple Vitamin (MULTIVITAMIN WITH MINERALS) TABS tablet Take 1 tablet by mouth See admin instructions. Takes daily on mondays, wednesdays, fridays, and saturdays    . Omega-3 Krill Oil 500 MG CAPS Take 1 capsule by mouth daily.    . Omeprazole Magnesium (PRILOSEC OTC PO) Take 1 tablet by mouth daily as needed (reflux).    . raloxifene (EVISTA) 60 MG tablet Take 60 mg by mouth See admin instructions. Takes 60mg  on mondays, wednesdays, fridays, saturdays    . LINZESS 72 MCG capsule     . omeprazole (PRILOSEC) 10 MG capsule as needed.     No current facility-administered medications for this visit.     Physical Exam BP 133/65 (BP Location: Left Arm, Patient Position: Sitting, Cuff Size: Normal)   Pulse 69   Ht 5\' 8"  (1.727 m)   Wt 150 lb (68 kg)   SpO2 98%   BMI 22.65 kg/m  76 year old woman in no acute distress Alert and oriented x3 with no focal deficits Lungs clear with equal breath sounds bilaterally Cardiac regular rate and rhythm normal S1 and S2 No cervical or subclavicular adenopathy  Diagnostic Tests: CT  CHEST WITHOUT CONTRAST  TECHNIQUE: Multidetector CT imaging of the chest was performed following the standard protocol without IV contrast.  COMPARISON:  03/05/2016  FINDINGS: Cardiovascular: The heart size is normal. No pericardial effusion. Coronary artery calcification is evident. Atherosclerotic calcification is noted in the wall of the thoracic aorta.  Mediastinum/Nodes: No mediastinal lymphadenopathy. No evidence for gross hilar lymphadenopathy although assessment is limited by the lack of intravenous contrast on today's study. The esophagus has normal imaging features. There is no axillary lymphadenopathy.  Lungs/Pleura: Emphysema with architectural distortion again identified in both lungs. 3 mm medial left upper lobe nodule seen on image 18 series 4 is unchanged. Postsurgical changes seen right lower lobe, similar to prior. No new or  progressive pulmonary parenchymal finding.  Upper Abdomen: Unremarkable.  Musculoskeletal: Bone windows reveal no worrisome lytic or sclerotic osseous lesions.  IMPRESSION: 1. Stable exam.  No new or progressive findings. 2. Postsurgical changes right lung, similar to prior. 3. Emphysema with areas of architectural distortion bilaterally. 4.  Aortic Atherosclerois (ICD10-170.0)   Electronically Signed   By: Misty Stanley M.D.   On: 03/12/2017 13:02 I personally reviewed the CT chest and concur with the findings noted above  Impression: [name] is a 76 year old former smoker who had a right lower lobe superior segmentectomy for stage Ia adenocarcinoma in 2014.  She is now 4 years out from surgery with no evidence of recurrent disease.  Coronary atherosclerosis-calcification seen on CT.  No clinical symptoms.  Aortic atherosclerosis-noted on CT.  She is on a statin.  Plan: Return in 1 year with CT of chest for 5-year follow-up visit  Melrose Nakayama, MD [facility] [phone]

## 2017-03-15 DIAGNOSIS — Z6823 Body mass index (BMI) 23.0-23.9, adult: Secondary | ICD-10-CM | POA: Diagnosis not present

## 2017-03-15 DIAGNOSIS — M353 Polymyalgia rheumatica: Secondary | ICD-10-CM | POA: Diagnosis not present

## 2017-03-15 DIAGNOSIS — M2550 Pain in unspecified joint: Secondary | ICD-10-CM | POA: Diagnosis not present

## 2017-03-15 DIAGNOSIS — M545 Low back pain: Secondary | ICD-10-CM | POA: Diagnosis not present

## 2017-03-21 DIAGNOSIS — I83813 Varicose veins of bilateral lower extremities with pain: Secondary | ICD-10-CM | POA: Diagnosis not present

## 2017-03-27 DIAGNOSIS — J329 Chronic sinusitis, unspecified: Secondary | ICD-10-CM | POA: Diagnosis not present

## 2017-04-07 DIAGNOSIS — J0100 Acute maxillary sinusitis, unspecified: Secondary | ICD-10-CM | POA: Diagnosis not present

## 2017-05-13 DIAGNOSIS — M533 Sacrococcygeal disorders, not elsewhere classified: Secondary | ICD-10-CM | POA: Diagnosis not present

## 2017-09-04 ENCOUNTER — Other Ambulatory Visit: Payer: Self-pay | Admitting: Obstetrics and Gynecology

## 2017-09-04 DIAGNOSIS — Z1231 Encounter for screening mammogram for malignant neoplasm of breast: Secondary | ICD-10-CM

## 2017-09-10 DIAGNOSIS — F419 Anxiety disorder, unspecified: Secondary | ICD-10-CM | POA: Diagnosis not present

## 2017-09-10 DIAGNOSIS — E7800 Pure hypercholesterolemia, unspecified: Secondary | ICD-10-CM | POA: Diagnosis not present

## 2017-09-10 DIAGNOSIS — E559 Vitamin D deficiency, unspecified: Secondary | ICD-10-CM | POA: Diagnosis not present

## 2017-09-10 DIAGNOSIS — M810 Age-related osteoporosis without current pathological fracture: Secondary | ICD-10-CM | POA: Diagnosis not present

## 2017-09-10 DIAGNOSIS — F339 Major depressive disorder, recurrent, unspecified: Secondary | ICD-10-CM | POA: Diagnosis not present

## 2017-09-10 DIAGNOSIS — K5909 Other constipation: Secondary | ICD-10-CM | POA: Diagnosis not present

## 2017-10-31 DIAGNOSIS — F339 Major depressive disorder, recurrent, unspecified: Secondary | ICD-10-CM | POA: Diagnosis not present

## 2017-10-31 DIAGNOSIS — R7301 Impaired fasting glucose: Secondary | ICD-10-CM | POA: Diagnosis not present

## 2017-10-31 DIAGNOSIS — F419 Anxiety disorder, unspecified: Secondary | ICD-10-CM | POA: Diagnosis not present

## 2017-10-31 DIAGNOSIS — K219 Gastro-esophageal reflux disease without esophagitis: Secondary | ICD-10-CM | POA: Diagnosis not present

## 2017-12-02 DIAGNOSIS — Z961 Presence of intraocular lens: Secondary | ICD-10-CM | POA: Diagnosis not present

## 2017-12-02 DIAGNOSIS — H524 Presbyopia: Secondary | ICD-10-CM | POA: Diagnosis not present

## 2017-12-02 DIAGNOSIS — H52202 Unspecified astigmatism, left eye: Secondary | ICD-10-CM | POA: Diagnosis not present

## 2017-12-03 DIAGNOSIS — M353 Polymyalgia rheumatica: Secondary | ICD-10-CM | POA: Diagnosis not present

## 2017-12-03 DIAGNOSIS — Z6823 Body mass index (BMI) 23.0-23.9, adult: Secondary | ICD-10-CM | POA: Diagnosis not present

## 2017-12-03 DIAGNOSIS — M545 Low back pain: Secondary | ICD-10-CM | POA: Diagnosis not present

## 2017-12-03 DIAGNOSIS — M2550 Pain in unspecified joint: Secondary | ICD-10-CM | POA: Diagnosis not present

## 2017-12-04 DIAGNOSIS — J0100 Acute maxillary sinusitis, unspecified: Secondary | ICD-10-CM | POA: Diagnosis not present

## 2018-01-10 DIAGNOSIS — Z23 Encounter for immunization: Secondary | ICD-10-CM | POA: Diagnosis not present

## 2018-01-17 ENCOUNTER — Other Ambulatory Visit: Payer: Self-pay | Admitting: Thoracic Surgery (Cardiothoracic Vascular Surgery)

## 2018-01-17 DIAGNOSIS — C3490 Malignant neoplasm of unspecified part of unspecified bronchus or lung: Secondary | ICD-10-CM

## 2018-01-30 DIAGNOSIS — M722 Plantar fascial fibromatosis: Secondary | ICD-10-CM | POA: Diagnosis not present

## 2018-01-30 DIAGNOSIS — M7672 Peroneal tendinitis, left leg: Secondary | ICD-10-CM | POA: Diagnosis not present

## 2018-02-17 ENCOUNTER — Ambulatory Visit
Admission: RE | Admit: 2018-02-17 | Discharge: 2018-02-17 | Disposition: A | Discharge status: Home / Self Care | Payer: Medicare Other | Source: Ambulatory Visit | Attending: Obstetrics and Gynecology | Admitting: Obstetrics and Gynecology

## 2018-02-17 DIAGNOSIS — Z1231 Encounter for screening mammogram for malignant neoplasm of breast: Secondary | ICD-10-CM | POA: Diagnosis not present

## 2018-02-20 DIAGNOSIS — M810 Age-related osteoporosis without current pathological fracture: Secondary | ICD-10-CM | POA: Diagnosis not present

## 2018-02-20 DIAGNOSIS — Z6822 Body mass index (BMI) 22.0-22.9, adult: Secondary | ICD-10-CM | POA: Diagnosis not present

## 2018-02-20 DIAGNOSIS — Z01419 Encounter for gynecological examination (general) (routine) without abnormal findings: Secondary | ICD-10-CM | POA: Diagnosis not present

## 2018-02-27 DIAGNOSIS — L814 Other melanin hyperpigmentation: Secondary | ICD-10-CM | POA: Diagnosis not present

## 2018-02-27 DIAGNOSIS — L570 Actinic keratosis: Secondary | ICD-10-CM | POA: Diagnosis not present

## 2018-02-27 DIAGNOSIS — D225 Melanocytic nevi of trunk: Secondary | ICD-10-CM | POA: Diagnosis not present

## 2018-02-27 DIAGNOSIS — L821 Other seborrheic keratosis: Secondary | ICD-10-CM | POA: Diagnosis not present

## 2018-02-27 DIAGNOSIS — Z23 Encounter for immunization: Secondary | ICD-10-CM | POA: Diagnosis not present

## 2018-02-27 DIAGNOSIS — L820 Inflamed seborrheic keratosis: Secondary | ICD-10-CM | POA: Diagnosis not present

## 2018-03-03 DIAGNOSIS — M722 Plantar fascial fibromatosis: Secondary | ICD-10-CM | POA: Diagnosis not present

## 2018-03-03 DIAGNOSIS — M76822 Posterior tibial tendinitis, left leg: Secondary | ICD-10-CM | POA: Diagnosis not present

## 2018-03-05 DIAGNOSIS — M76822 Posterior tibial tendinitis, left leg: Secondary | ICD-10-CM | POA: Diagnosis not present

## 2018-03-05 DIAGNOSIS — M722 Plantar fascial fibromatosis: Secondary | ICD-10-CM | POA: Diagnosis not present

## 2018-03-11 ENCOUNTER — Encounter: Payer: Self-pay | Admitting: Thoracic Surgery (Cardiothoracic Vascular Surgery)

## 2018-03-11 ENCOUNTER — Ambulatory Visit (INDEPENDENT_AMBULATORY_CARE_PROVIDER_SITE_OTHER): Payer: Medicare Other | Admitting: Thoracic Surgery (Cardiothoracic Vascular Surgery)

## 2018-03-11 ENCOUNTER — Ambulatory Visit
Admission: RE | Admit: 2018-03-11 | Discharge: 2018-03-11 | Disposition: A | Discharge status: Home / Self Care | Payer: Medicare Other | Source: Ambulatory Visit | Attending: Thoracic Surgery (Cardiothoracic Vascular Surgery) | Admitting: Thoracic Surgery (Cardiothoracic Vascular Surgery)

## 2018-03-11 VITALS — BP 116/59 | HR 66 | Resp 20 | Ht 68.0 in | Wt 146.0 lb

## 2018-03-11 DIAGNOSIS — Z902 Acquired absence of lung [part of]: Secondary | ICD-10-CM

## 2018-03-11 DIAGNOSIS — Z85118 Personal history of other malignant neoplasm of bronchus and lung: Secondary | ICD-10-CM | POA: Diagnosis not present

## 2018-03-11 DIAGNOSIS — M76822 Posterior tibial tendinitis, left leg: Secondary | ICD-10-CM | POA: Diagnosis not present

## 2018-03-11 DIAGNOSIS — M722 Plantar fascial fibromatosis: Secondary | ICD-10-CM | POA: Diagnosis not present

## 2018-03-11 DIAGNOSIS — I700 Atherosclerosis of aorta: Secondary | ICD-10-CM | POA: Diagnosis not present

## 2018-03-11 DIAGNOSIS — C3490 Malignant neoplasm of unspecified part of unspecified bronchus or lung: Secondary | ICD-10-CM

## 2018-03-11 NOTE — Progress Notes (Signed)
[address]       [place] [address]             [phone]     HPI: [name] returns for a scheduled follow-up visit  [name] is a 77 year old former smoker with a past history of stage Ia non-small cell carcinoma of the lung, reflux, hypercholesterolemia, arthritis, coronary atherosclerosis, thoracic aortic atherosclerosis, and osteoporosis.  I did a right lower lobe superior segmentectomy in October 2014 for a stage IA adenocarcinoma.  She has been followed since then.  She quit smoking in 2007.  She denies any chest pain, pressure, or tightness.  She occasionally has heartburn.  She has not had any significant respiratory issues in the past year.  Past Medical History:  Diagnosis Date  . Arthritis   . GERD (gastroesophageal reflux disease)   . H/O vitamin D deficiency   . Hypercholesteremia   . Hyperplastic polyps of stomach   . Joint pain   . Non-small cell cancer of right lung ([place]) 01/2013   Stage IA non small cell  . Osteoporosis     Current Outpatient Medications  Medication Sig Dispense Refill  . ALPRAZolam (XANAX) 0.25 MG tablet As needed for flying    . atorvastatin (LIPITOR) 10 MG tablet Take 10 mg by mouth daily. Takes M-W-F    . Cholecalciferol (VITAMIN D) 2000 UNITS tablet Take 1,000 Units by mouth daily.     . cyclobenzaprine (FLEXERIL) 10 MG tablet TAKE 1/2 TO 1 TABLET 3 TIMES A DAY AS NEEDED  2  . hydroxypropyl methylcellulose (ISOPTO TEARS) 2.5 % ophthalmic solution Place 1 drop into both eyes daily.    Marland Kitchen ibuprofen (ADVIL,MOTRIN) 200 MG tablet Take 200-400 mg by mouth every 8 (eight) hours as needed for pain or headache.    Marland Kitchen LINZESS 72 MCG capsule     . meclizine (ANTIVERT) 25 MG tablet Take 25 mg by mouth 3 (three) times daily as needed for dizziness.     . meloxicam (MOBIC) 15 MG tablet Take 15 mg by mouth daily as needed for pain.     . Multiple Vitamin (MULTIVITAMIN WITH MINERALS) TABS tablet Take 1 tablet by mouth See  admin instructions. Takes daily on mondays, wednesdays, fridays, and saturdays    . Omega-3 Krill Oil 500 MG CAPS Take 1 capsule by mouth daily.    Marland Kitchen omeprazole (PRILOSEC) 10 MG capsule as needed.    . Omeprazole Magnesium (PRILOSEC OTC PO) Take 1 tablet by mouth daily as needed (reflux).    . raloxifene (EVISTA) 60 MG tablet Take 60 mg by mouth See admin instructions. Takes 60mg  on mondays, wednesdays, fridays, saturdays     No current facility-administered medications for this visit.     Physical Exam BP (!) 116/59   Pulse 66   Resp 20   Ht 5\' 8"  (1.727 m)   Wt 146 lb (66.2 kg)   SpO2 97% Comment: RA  BMI 22.85 kg/m  77 year old woman in no acute distress Alert and oriented x3 with no focal deficits Lungs clear with equal breath sounds bilaterally Cardiac regular rate and rhythm, normal S1 and S2, no murmur No cervical or supraclavicular adenopathy  Diagnostic Tests: CT CHEST WITHOUT CONTRAST  TECHNIQUE: Multidetector CT imaging of the chest was performed following the standard protocol without IV contrast.  COMPARISON:  03/12/2017  FINDINGS: Cardiovascular: The heart size is normal. No pericardial effusion. Aortic atherosclerosis. Calcification in the LAD, left circumflex and RCA  coronary arteries.  Mediastinum/Nodes: Normal appearance of the thyroid gland. The trachea appears patent and is midline. Normal appearance of the esophagus. No axillary or supraclavicular adenopathy. No mediastinal or hilar adenopathy.  Lungs/Pleura: Moderate changes of emphysema identified. Postoperative changes within the right middle lobe and right lower lobe identified. These appears stable when compared with 03/12/2017. Small pulmonary nodule within the left upper lobe measures 3 mm and is unchanged, image 31/8. No new pulmonary nodules identified.  Upper Abdomen: No acute abnormality.  Musculoskeletal: No chest wall mass or suspicious bone  lesions identified.  IMPRESSION: 1. Stable exam. No findings identified to suggest recurrent tumor or metastatic disease. 2. Aortic Atherosclerosis (ICD10-I70.0) and Emphysema (ICD10-J43.9). 3. Multi vessel coronary artery atherosclerotic calcifications.   Electronically Signed   By: Kerby Moors M.D.   On: 03/11/2018 10:46 I personally reviewed the CT images and concur with the findings noted above  Impression: [name] is a 78 year old woman who is now 5 years out from a right lower lobe superior segmentectomy for stage IA adenocarcinoma.  She has no evidence of recurrent disease.  She is considered cured.  She does still meets criteria for low-dose screening and given her history of lung cancer she needs to continue to have a CT annually.  I will see her back in 1 year with that.  Coronary atherosclerosis, thoracic aortic atherosclerosis-no anginal symptoms.  She is on a statin.  Her blood pressure is well controlled.  Plan: Return in 1 year with CT of chest  Melrose Nakayama, MD [facility] [phone]

## 2018-03-18 DIAGNOSIS — M722 Plantar fascial fibromatosis: Secondary | ICD-10-CM | POA: Diagnosis not present

## 2018-03-18 DIAGNOSIS — M76822 Posterior tibial tendinitis, left leg: Secondary | ICD-10-CM | POA: Diagnosis not present

## 2018-03-20 DIAGNOSIS — M76822 Posterior tibial tendinitis, left leg: Secondary | ICD-10-CM | POA: Diagnosis not present

## 2018-03-20 DIAGNOSIS — M722 Plantar fascial fibromatosis: Secondary | ICD-10-CM | POA: Diagnosis not present

## 2018-03-26 DIAGNOSIS — M722 Plantar fascial fibromatosis: Secondary | ICD-10-CM | POA: Diagnosis not present

## 2018-03-26 DIAGNOSIS — M76822 Posterior tibial tendinitis, left leg: Secondary | ICD-10-CM | POA: Diagnosis not present

## 2018-04-02 DIAGNOSIS — J0100 Acute maxillary sinusitis, unspecified: Secondary | ICD-10-CM | POA: Diagnosis not present

## 2018-04-04 DIAGNOSIS — M722 Plantar fascial fibromatosis: Secondary | ICD-10-CM | POA: Diagnosis not present

## 2018-04-04 DIAGNOSIS — M76822 Posterior tibial tendinitis, left leg: Secondary | ICD-10-CM | POA: Diagnosis not present

## 2018-04-23 DIAGNOSIS — M7672 Peroneal tendinitis, left leg: Secondary | ICD-10-CM | POA: Diagnosis not present

## 2018-04-25 DIAGNOSIS — J0101 Acute recurrent maxillary sinusitis: Secondary | ICD-10-CM | POA: Diagnosis not present

## 2018-05-06 DIAGNOSIS — J329 Chronic sinusitis, unspecified: Secondary | ICD-10-CM | POA: Diagnosis not present

## 2018-05-09 ENCOUNTER — Other Ambulatory Visit: Payer: Self-pay | Admitting: Family Medicine

## 2018-05-09 DIAGNOSIS — J329 Chronic sinusitis, unspecified: Secondary | ICD-10-CM

## 2018-05-13 ENCOUNTER — Ambulatory Visit
Admission: RE | Admit: 2018-05-13 | Discharge: 2018-05-13 | Disposition: A | Discharge status: Home / Self Care | Payer: Medicare Other | Source: Ambulatory Visit | Attending: Family Medicine | Admitting: Family Medicine

## 2018-05-13 DIAGNOSIS — J329 Chronic sinusitis, unspecified: Secondary | ICD-10-CM

## 2018-05-13 DIAGNOSIS — J320 Chronic maxillary sinusitis: Secondary | ICD-10-CM | POA: Diagnosis not present

## 2018-06-02 DIAGNOSIS — J320 Chronic maxillary sinusitis: Secondary | ICD-10-CM | POA: Diagnosis not present

## 2018-06-02 DIAGNOSIS — R0981 Nasal congestion: Secondary | ICD-10-CM | POA: Insufficient documentation

## 2018-06-30 DIAGNOSIS — R0981 Nasal congestion: Secondary | ICD-10-CM | POA: Diagnosis not present

## 2018-06-30 DIAGNOSIS — J342 Deviated nasal septum: Secondary | ICD-10-CM | POA: Diagnosis not present

## 2018-06-30 DIAGNOSIS — J320 Chronic maxillary sinusitis: Secondary | ICD-10-CM | POA: Diagnosis not present

## 2018-08-26 DIAGNOSIS — J320 Chronic maxillary sinusitis: Secondary | ICD-10-CM | POA: Diagnosis not present

## 2018-09-08 ENCOUNTER — Encounter: Payer: Self-pay | Admitting: Gastroenterology

## 2018-09-15 DIAGNOSIS — E7800 Pure hypercholesterolemia, unspecified: Secondary | ICD-10-CM | POA: Diagnosis not present

## 2018-09-15 DIAGNOSIS — R7309 Other abnormal glucose: Secondary | ICD-10-CM | POA: Diagnosis not present

## 2018-09-15 DIAGNOSIS — L659 Nonscarring hair loss, unspecified: Secondary | ICD-10-CM | POA: Diagnosis not present

## 2018-09-15 DIAGNOSIS — K5900 Constipation, unspecified: Secondary | ICD-10-CM | POA: Diagnosis not present

## 2018-09-15 DIAGNOSIS — E559 Vitamin D deficiency, unspecified: Secondary | ICD-10-CM | POA: Diagnosis not present

## 2018-09-15 DIAGNOSIS — Z0000 Encounter for general adult medical examination without abnormal findings: Secondary | ICD-10-CM | POA: Diagnosis not present

## 2018-09-23 DIAGNOSIS — F419 Anxiety disorder, unspecified: Secondary | ICD-10-CM | POA: Diagnosis not present

## 2018-09-23 DIAGNOSIS — K5909 Other constipation: Secondary | ICD-10-CM | POA: Diagnosis not present

## 2018-09-23 DIAGNOSIS — E559 Vitamin D deficiency, unspecified: Secondary | ICD-10-CM | POA: Diagnosis not present

## 2018-09-23 DIAGNOSIS — R7303 Prediabetes: Secondary | ICD-10-CM | POA: Diagnosis not present

## 2018-09-23 DIAGNOSIS — E7800 Pure hypercholesterolemia, unspecified: Secondary | ICD-10-CM | POA: Diagnosis not present

## 2018-09-23 DIAGNOSIS — F339 Major depressive disorder, recurrent, unspecified: Secondary | ICD-10-CM | POA: Diagnosis not present

## 2018-12-04 ENCOUNTER — Other Ambulatory Visit: Payer: Self-pay | Admitting: Obstetrics and Gynecology

## 2018-12-04 DIAGNOSIS — Z1231 Encounter for screening mammogram for malignant neoplasm of breast: Secondary | ICD-10-CM

## 2018-12-04 DIAGNOSIS — Z961 Presence of intraocular lens: Secondary | ICD-10-CM | POA: Diagnosis not present

## 2018-12-10 DIAGNOSIS — Z79899 Other long term (current) drug therapy: Secondary | ICD-10-CM | POA: Diagnosis not present

## 2018-12-10 DIAGNOSIS — M353 Polymyalgia rheumatica: Secondary | ICD-10-CM | POA: Diagnosis not present

## 2018-12-10 DIAGNOSIS — M2550 Pain in unspecified joint: Secondary | ICD-10-CM | POA: Diagnosis not present

## 2019-01-06 DIAGNOSIS — E559 Vitamin D deficiency, unspecified: Secondary | ICD-10-CM | POA: Diagnosis not present

## 2019-01-06 DIAGNOSIS — E7800 Pure hypercholesterolemia, unspecified: Secondary | ICD-10-CM | POA: Diagnosis not present

## 2019-01-06 DIAGNOSIS — K5909 Other constipation: Secondary | ICD-10-CM | POA: Diagnosis not present

## 2019-01-06 DIAGNOSIS — F339 Major depressive disorder, recurrent, unspecified: Secondary | ICD-10-CM | POA: Diagnosis not present

## 2019-01-06 DIAGNOSIS — R7303 Prediabetes: Secondary | ICD-10-CM | POA: Diagnosis not present

## 2019-01-06 DIAGNOSIS — F419 Anxiety disorder, unspecified: Secondary | ICD-10-CM | POA: Diagnosis not present

## 2019-02-05 ENCOUNTER — Other Ambulatory Visit: Payer: Self-pay | Admitting: Thoracic Surgery (Cardiothoracic Vascular Surgery)

## 2019-02-05 DIAGNOSIS — C3490 Malignant neoplasm of unspecified part of unspecified bronchus or lung: Secondary | ICD-10-CM

## 2019-02-20 ENCOUNTER — Other Ambulatory Visit: Payer: Self-pay

## 2019-02-20 ENCOUNTER — Ambulatory Visit
Admission: RE | Admit: 2019-02-20 | Discharge: 2019-02-20 | Disposition: A | Discharge status: Home / Self Care | Payer: Medicare Other | Source: Ambulatory Visit | Attending: Obstetrics and Gynecology | Admitting: Obstetrics and Gynecology

## 2019-02-20 DIAGNOSIS — Z1231 Encounter for screening mammogram for malignant neoplasm of breast: Secondary | ICD-10-CM

## 2019-02-25 DIAGNOSIS — R0981 Nasal congestion: Secondary | ICD-10-CM | POA: Diagnosis not present

## 2019-02-25 DIAGNOSIS — J342 Deviated nasal septum: Secondary | ICD-10-CM | POA: Diagnosis not present

## 2019-03-04 DIAGNOSIS — D225 Melanocytic nevi of trunk: Secondary | ICD-10-CM | POA: Diagnosis not present

## 2019-03-04 DIAGNOSIS — L820 Inflamed seborrheic keratosis: Secondary | ICD-10-CM | POA: Diagnosis not present

## 2019-03-04 DIAGNOSIS — L738 Other specified follicular disorders: Secondary | ICD-10-CM | POA: Diagnosis not present

## 2019-03-04 DIAGNOSIS — L219 Seborrheic dermatitis, unspecified: Secondary | ICD-10-CM | POA: Diagnosis not present

## 2019-03-04 DIAGNOSIS — L821 Other seborrheic keratosis: Secondary | ICD-10-CM | POA: Diagnosis not present

## 2019-03-04 DIAGNOSIS — L814 Other melanin hyperpigmentation: Secondary | ICD-10-CM | POA: Diagnosis not present

## 2019-03-10 ENCOUNTER — Ambulatory Visit: Payer: Medicare Other | Admitting: Thoracic Surgery (Cardiothoracic Vascular Surgery)

## 2019-03-17 DIAGNOSIS — Z01419 Encounter for gynecological examination (general) (routine) without abnormal findings: Secondary | ICD-10-CM | POA: Diagnosis not present

## 2019-03-24 ENCOUNTER — Ambulatory Visit
Admission: RE | Admit: 2019-03-24 | Discharge: 2019-03-24 | Disposition: A | Discharge status: Home / Self Care | Payer: Medicare Other | Source: Ambulatory Visit | Attending: Thoracic Surgery (Cardiothoracic Vascular Surgery) | Admitting: Thoracic Surgery (Cardiothoracic Vascular Surgery)

## 2019-03-24 ENCOUNTER — Ambulatory Visit (INDEPENDENT_AMBULATORY_CARE_PROVIDER_SITE_OTHER): Payer: Medicare Other | Admitting: Thoracic Surgery (Cardiothoracic Vascular Surgery)

## 2019-03-24 ENCOUNTER — Other Ambulatory Visit: Payer: Self-pay

## 2019-03-24 ENCOUNTER — Encounter: Payer: Self-pay | Admitting: Thoracic Surgery (Cardiothoracic Vascular Surgery)

## 2019-03-24 VITALS — BP 111/72 | HR 72 | Temp 97.6°F | Resp 20 | Ht 68.0 in | Wt 147.0 lb

## 2019-03-24 DIAGNOSIS — Z902 Acquired absence of lung [part of]: Secondary | ICD-10-CM | POA: Diagnosis not present

## 2019-03-24 DIAGNOSIS — C3490 Malignant neoplasm of unspecified part of unspecified bronchus or lung: Secondary | ICD-10-CM | POA: Diagnosis not present

## 2019-03-24 DIAGNOSIS — Z85118 Personal history of other malignant neoplasm of bronchus and lung: Secondary | ICD-10-CM | POA: Diagnosis not present

## 2019-03-24 NOTE — Progress Notes (Signed)
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  I did a right lower lobe superior segmentectomy in October 2014 for a stage Ia adenocarcinoma.  She has been followed since that time.  Over the past year she has been feeling well.  She has not had any significant medical issues.  She is not aware of any falls or trauma to her chest.  Appetite is good.  Weight is stable.  Past Medical History:  Diagnosis Date  . Arthritis   . GERD (gastroesophageal reflux disease)   . H/O vitamin D deficiency   . Hypercholesteremia   . Hyperplastic polyps of stomach   . Joint pain   . Non-small cell cancer of right lung ([place]) 01/2013   Stage IA non small cell  . Osteoporosis     Current Outpatient Medications  Medication Sig Dispense Refill  . ALPRAZolam (XANAX) 0.25 MG tablet As needed for flying    . atorvastatin (LIPITOR) 10 MG tablet Take 10 mg by mouth daily. Takes M-W-F    . Cholecalciferol (VITAMIN D) 2000 UNITS tablet Take 1,000 Units by mouth daily.     . cyclobenzaprine (FLEXERIL) 10 MG tablet TAKE 1/2 TO 1 TABLET 3 TIMES A DAY AS NEEDED  2  . hydroxypropyl methylcellulose (ISOPTO TEARS) 2.5 % ophthalmic solution Place 1 drop into both eyes daily.    Marland Kitchen ibuprofen (ADVIL,MOTRIN) 200 MG tablet Take 200-400 mg by mouth every 8 (eight) hours as needed for pain or headache.    Marland Kitchen LINZESS 72 MCG capsule     . meclizine (ANTIVERT) 25 MG tablet Take 25 mg by mouth 3 (three) times daily as needed for dizziness.     . meloxicam (MOBIC) 15 MG tablet Take 15 mg by mouth daily as needed for pain.     . Multiple Vitamin (MULTIVITAMIN WITH MINERALS) TABS tablet  Take 1 tablet by mouth See admin instructions. Takes daily on mondays, wednesdays, fridays, and saturdays    . Omega-3 Krill Oil 500 MG CAPS Take 1 capsule by mouth daily.    Marland Kitchen omeprazole (PRILOSEC) 10 MG capsule as needed.    . Omeprazole Magnesium (PRILOSEC OTC PO) Take 1 tablet by mouth daily as needed (reflux).    . raloxifene (EVISTA) 60 MG tablet Take 60 mg by mouth See admin instructions. Takes 60mg  on mondays, wednesdays, fridays, saturdays     No current facility-administered medications for this visit.     Physical Exam BP 111/72   Pulse 72   Temp 97.6 F (36.4 C) (Skin)   Resp 20   Ht 5\' 8"  (1.727 m)   Wt 147 lb (66.7 kg)   SpO2 92% Comment: RA  BMI 22.21 kg/m  78 year old woman in no acute distress Alert and oriented x3 with no focal deficits Lungs clear bilaterally Cardiac regular rate and rhythm normal S1 and S2 No peripheral edema  Diagnostic Tests: CT CHEST WITHOUT CONTRAST  TECHNIQUE: Multidetector CT imaging of the chest was performed following the standard protocol without IV contrast.  COMPARISON: 03/14/2018  FINDINGS: Cardiovascular: Signs of moderate aortic atherosclerosis with calcification more pronounced at  the aortic arch similar to prior study. Calcified coronary artery disease. Heart size is normal without pericardial effusion. Vascular structures with limited assessment due to lack of intravenous contrast. Central pulmonary vasculature is of normal caliber.  Mediastinum/Nodes: No enlarged mediastinal or axillary lymph nodes. Thyroid gland, trachea, and esophagus demonstrate no significant findings.  Lungs/Pleura: Signs of previous wedge resection in the right middle lobe and right hilar dissection are similar to the prior study. No signs of consolidation or pleural effusion. No sign of suspicious mass or nodule.  Marked background pulmonary emphysema is similar to the prior study. Airways are patent.  Upper Abdomen: No sign of upper  abdominal abnormality.  Musculoskeletal: Signs of remote left-sided tenth rib fracture, this is new compared to the prior evaluation.  IMPRESSION: 1. Stable postoperative changes of the right middle lobe and right hilar dissection without evidence of recurrent or metastatic disease. 2. Tiny left upper lobe nodule is unchanged. 3. Signs of remote left-sided tenth rib fracture, new compared to the prior study. 4. Emphysema and aortic atherosclerosis.  Aortic Atherosclerosis (ICD10-I70.0) and Emphysema (ICD10-J43.9).   Electronically Signed By: Zetta Bills M.D. On: 03/24/2019 09:58 I personally reviewed the CT images and concur with the findings noted above with the exception of the postoperative changes are in the right lower lobe.  Impression: [name] is a 78 year old woman with a remote history of tobacco abuse (quit 2007) who was diagnosed with a stage Ia non-small cell carcinoma treated with a right lower lobe segmentectomy in 2014.  She is now 6 years out from surgery with no evidence of recurrent disease.  Due to her smoking history she needs continued annual follow-up.  Thoracic aortic and coronary atherosclerosis-asymptomatic.  She is on Lipitor.  Left 10th rib fracture.  New since prior study.  Given her age and osteoporosis it is possible this was due to minor trauma or even coughing.  She is not aware of any significant trauma.  Plan: Return in 1 year with CT chest  Melrose Nakayama, MD [facility] [phone]

## 2019-06-11 DIAGNOSIS — M545 Low back pain: Secondary | ICD-10-CM | POA: Diagnosis not present

## 2019-06-11 DIAGNOSIS — Z6823 Body mass index (BMI) 23.0-23.9, adult: Secondary | ICD-10-CM | POA: Diagnosis not present

## 2019-06-11 DIAGNOSIS — M2550 Pain in unspecified joint: Secondary | ICD-10-CM | POA: Diagnosis not present

## 2019-06-11 DIAGNOSIS — M353 Polymyalgia rheumatica: Secondary | ICD-10-CM | POA: Diagnosis not present

## 2019-09-16 DIAGNOSIS — L659 Nonscarring hair loss, unspecified: Secondary | ICD-10-CM | POA: Diagnosis not present

## 2019-09-16 DIAGNOSIS — Z6823 Body mass index (BMI) 23.0-23.9, adult: Secondary | ICD-10-CM | POA: Diagnosis not present

## 2019-09-16 DIAGNOSIS — R5383 Other fatigue: Secondary | ICD-10-CM | POA: Diagnosis not present

## 2019-09-16 DIAGNOSIS — E7800 Pure hypercholesterolemia, unspecified: Secondary | ICD-10-CM | POA: Diagnosis not present

## 2019-09-16 DIAGNOSIS — K5900 Constipation, unspecified: Secondary | ICD-10-CM | POA: Diagnosis not present

## 2019-09-16 DIAGNOSIS — R7303 Prediabetes: Secondary | ICD-10-CM | POA: Diagnosis not present

## 2019-09-16 DIAGNOSIS — Z0000 Encounter for general adult medical examination without abnormal findings: Secondary | ICD-10-CM | POA: Diagnosis not present

## 2019-09-16 DIAGNOSIS — E559 Vitamin D deficiency, unspecified: Secondary | ICD-10-CM | POA: Diagnosis not present

## 2019-09-28 DIAGNOSIS — K5909 Other constipation: Secondary | ICD-10-CM | POA: Diagnosis not present

## 2019-09-28 DIAGNOSIS — M810 Age-related osteoporosis without current pathological fracture: Secondary | ICD-10-CM | POA: Diagnosis not present

## 2019-09-28 DIAGNOSIS — F339 Major depressive disorder, recurrent, unspecified: Secondary | ICD-10-CM | POA: Diagnosis not present

## 2019-09-28 DIAGNOSIS — Z86010 Personal history of colonic polyps: Secondary | ICD-10-CM | POA: Diagnosis not present

## 2019-09-28 DIAGNOSIS — Z85118 Personal history of other malignant neoplasm of bronchus and lung: Secondary | ICD-10-CM | POA: Diagnosis not present

## 2019-09-28 DIAGNOSIS — K219 Gastro-esophageal reflux disease without esophagitis: Secondary | ICD-10-CM | POA: Diagnosis not present

## 2019-09-28 DIAGNOSIS — F419 Anxiety disorder, unspecified: Secondary | ICD-10-CM | POA: Diagnosis not present

## 2019-09-28 DIAGNOSIS — R7303 Prediabetes: Secondary | ICD-10-CM | POA: Diagnosis not present

## 2019-09-28 DIAGNOSIS — E7800 Pure hypercholesterolemia, unspecified: Secondary | ICD-10-CM | POA: Diagnosis not present

## 2019-09-28 DIAGNOSIS — M353 Polymyalgia rheumatica: Secondary | ICD-10-CM | POA: Diagnosis not present

## 2019-09-28 DIAGNOSIS — E559 Vitamin D deficiency, unspecified: Secondary | ICD-10-CM | POA: Diagnosis not present

## 2019-10-07 ENCOUNTER — Other Ambulatory Visit: Payer: Self-pay | Admitting: Family Medicine

## 2019-10-07 DIAGNOSIS — M810 Age-related osteoporosis without current pathological fracture: Secondary | ICD-10-CM

## 2019-10-27 DIAGNOSIS — F339 Major depressive disorder, recurrent, unspecified: Secondary | ICD-10-CM | POA: Diagnosis not present

## 2019-10-27 DIAGNOSIS — E7800 Pure hypercholesterolemia, unspecified: Secondary | ICD-10-CM | POA: Diagnosis not present

## 2019-10-27 DIAGNOSIS — M810 Age-related osteoporosis without current pathological fracture: Secondary | ICD-10-CM | POA: Diagnosis not present

## 2019-10-27 DIAGNOSIS — Z85118 Personal history of other malignant neoplasm of bronchus and lung: Secondary | ICD-10-CM | POA: Diagnosis not present

## 2019-11-19 DIAGNOSIS — F339 Major depressive disorder, recurrent, unspecified: Secondary | ICD-10-CM | POA: Diagnosis not present

## 2019-11-19 DIAGNOSIS — Z85118 Personal history of other malignant neoplasm of bronchus and lung: Secondary | ICD-10-CM | POA: Diagnosis not present

## 2019-11-19 DIAGNOSIS — E7800 Pure hypercholesterolemia, unspecified: Secondary | ICD-10-CM | POA: Diagnosis not present

## 2019-11-19 DIAGNOSIS — M810 Age-related osteoporosis without current pathological fracture: Secondary | ICD-10-CM | POA: Diagnosis not present

## 2019-12-10 DIAGNOSIS — H524 Presbyopia: Secondary | ICD-10-CM | POA: Diagnosis not present

## 2019-12-10 DIAGNOSIS — Z961 Presence of intraocular lens: Secondary | ICD-10-CM | POA: Diagnosis not present

## 2019-12-10 DIAGNOSIS — H52202 Unspecified astigmatism, left eye: Secondary | ICD-10-CM | POA: Diagnosis not present

## 2019-12-30 ENCOUNTER — Ambulatory Visit
Admission: RE | Admit: 2019-12-30 | Discharge: 2019-12-30 | Disposition: A | Discharge status: Home / Self Care | Payer: Medicare Other | Source: Ambulatory Visit | Attending: Family Medicine | Admitting: Family Medicine

## 2019-12-30 ENCOUNTER — Other Ambulatory Visit: Payer: Self-pay

## 2019-12-30 DIAGNOSIS — Z780 Asymptomatic menopausal state: Secondary | ICD-10-CM | POA: Diagnosis not present

## 2019-12-30 DIAGNOSIS — M810 Age-related osteoporosis without current pathological fracture: Secondary | ICD-10-CM | POA: Diagnosis not present

## 2020-01-04 ENCOUNTER — Other Ambulatory Visit: Payer: Self-pay | Admitting: Obstetrics and Gynecology

## 2020-01-04 DIAGNOSIS — Z1231 Encounter for screening mammogram for malignant neoplasm of breast: Secondary | ICD-10-CM

## 2020-02-10 DIAGNOSIS — E7800 Pure hypercholesterolemia, unspecified: Secondary | ICD-10-CM | POA: Diagnosis not present

## 2020-02-10 DIAGNOSIS — Z85118 Personal history of other malignant neoplasm of bronchus and lung: Secondary | ICD-10-CM | POA: Diagnosis not present

## 2020-02-10 DIAGNOSIS — M810 Age-related osteoporosis without current pathological fracture: Secondary | ICD-10-CM | POA: Diagnosis not present

## 2020-02-10 DIAGNOSIS — F339 Major depressive disorder, recurrent, unspecified: Secondary | ICD-10-CM | POA: Diagnosis not present

## 2020-02-23 ENCOUNTER — Ambulatory Visit
Admission: RE | Admit: 2020-02-23 | Discharge: 2020-02-23 | Disposition: A | Discharge status: Home / Self Care | Payer: Medicare Other | Source: Ambulatory Visit | Attending: Obstetrics and Gynecology | Admitting: Obstetrics and Gynecology

## 2020-02-23 ENCOUNTER — Other Ambulatory Visit: Payer: Self-pay

## 2020-02-23 DIAGNOSIS — Z1231 Encounter for screening mammogram for malignant neoplasm of breast: Secondary | ICD-10-CM

## 2020-03-01 ENCOUNTER — Other Ambulatory Visit: Payer: Self-pay | Admitting: *Deleted

## 2020-03-01 DIAGNOSIS — Z85118 Personal history of other malignant neoplasm of bronchus and lung: Secondary | ICD-10-CM

## 2020-03-29 DIAGNOSIS — L578 Other skin changes due to chronic exposure to nonionizing radiation: Secondary | ICD-10-CM | POA: Diagnosis not present

## 2020-03-29 DIAGNOSIS — D225 Melanocytic nevi of trunk: Secondary | ICD-10-CM | POA: Diagnosis not present

## 2020-03-29 DIAGNOSIS — L738 Other specified follicular disorders: Secondary | ICD-10-CM | POA: Diagnosis not present

## 2020-03-29 DIAGNOSIS — L853 Xerosis cutis: Secondary | ICD-10-CM | POA: Diagnosis not present

## 2020-03-29 DIAGNOSIS — M79673 Pain in unspecified foot: Secondary | ICD-10-CM | POA: Diagnosis not present

## 2020-03-29 DIAGNOSIS — L814 Other melanin hyperpigmentation: Secondary | ICD-10-CM | POA: Diagnosis not present

## 2020-03-29 DIAGNOSIS — L821 Other seborrheic keratosis: Secondary | ICD-10-CM | POA: Diagnosis not present

## 2020-03-29 DIAGNOSIS — L84 Corns and callosities: Secondary | ICD-10-CM | POA: Diagnosis not present

## 2020-03-31 DIAGNOSIS — F419 Anxiety disorder, unspecified: Secondary | ICD-10-CM | POA: Diagnosis not present

## 2020-03-31 DIAGNOSIS — M810 Age-related osteoporosis without current pathological fracture: Secondary | ICD-10-CM | POA: Diagnosis not present

## 2020-03-31 DIAGNOSIS — R7303 Prediabetes: Secondary | ICD-10-CM | POA: Diagnosis not present

## 2020-03-31 DIAGNOSIS — E7800 Pure hypercholesterolemia, unspecified: Secondary | ICD-10-CM | POA: Diagnosis not present

## 2020-03-31 DIAGNOSIS — F339 Major depressive disorder, recurrent, unspecified: Secondary | ICD-10-CM | POA: Diagnosis not present

## 2020-03-31 DIAGNOSIS — E559 Vitamin D deficiency, unspecified: Secondary | ICD-10-CM | POA: Diagnosis not present

## 2020-04-05 ENCOUNTER — Ambulatory Visit
Admission: RE | Admit: 2020-04-05 | Discharge: 2020-04-05 | Disposition: A | Discharge status: Home / Self Care | Payer: Medicare Other | Source: Ambulatory Visit | Attending: Thoracic Surgery (Cardiothoracic Vascular Surgery) | Admitting: Thoracic Surgery (Cardiothoracic Vascular Surgery)

## 2020-04-05 DIAGNOSIS — Z85118 Personal history of other malignant neoplasm of bronchus and lung: Secondary | ICD-10-CM

## 2020-04-05 DIAGNOSIS — I700 Atherosclerosis of aorta: Secondary | ICD-10-CM | POA: Diagnosis not present

## 2020-04-05 DIAGNOSIS — J439 Emphysema, unspecified: Secondary | ICD-10-CM | POA: Diagnosis not present

## 2020-04-05 DIAGNOSIS — I2510 Atherosclerotic heart disease of native coronary artery without angina pectoris: Secondary | ICD-10-CM | POA: Diagnosis not present

## 2020-04-12 ENCOUNTER — Other Ambulatory Visit: Payer: Self-pay

## 2020-04-12 ENCOUNTER — Ambulatory Visit (INDEPENDENT_AMBULATORY_CARE_PROVIDER_SITE_OTHER): Payer: Medicare Other | Admitting: Thoracic Surgery (Cardiothoracic Vascular Surgery)

## 2020-04-12 ENCOUNTER — Encounter: Payer: Self-pay | Admitting: Thoracic Surgery (Cardiothoracic Vascular Surgery)

## 2020-04-12 VITALS — BP 135/70 | HR 80 | Resp 20 | Ht 67.0 in | Wt 145.0 lb

## 2020-04-12 DIAGNOSIS — Z85118 Personal history of other malignant neoplasm of bronchus and lung: Secondary | ICD-10-CM

## 2020-04-12 NOTE — Progress Notes (Signed)
[name]       [place],[name]             [phone]     HPI: Mrs. Dorough returns for an annual follow-up  [name] is a 79 year old former smoker with a history of a stage Ia non-small cell carcinoma, hyperlipidemia, coronary atherosclerosis, thoracic aortic atherosclerosis, and osteoporosis.  I did a right lower lobe superior segmentectomy in October 2014 for a T1, N0, stage Ia adenocarcinoma.  I last saw her in the office a year ago. She was doing well at that time. She had a new rib fracture that she was not aware of. In the interim since that visit she has been feeling well. She quit smoking in 2007. She denies any chest pain, pressure, tightness, weight loss, shortness of breath. She has a good appetite and her weight is stable. She is concerned about the atherosclerosis findings on her CT.  Past Medical History:  Diagnosis Date   Arthritis    GERD (gastroesophageal reflux disease)    H/O vitamin D deficiency    Hypercholesteremia    Hyperplastic polyps of stomach    Joint pain    Non-small cell cancer of right lung ([place]) 01/2013   Stage IA non small cell   Osteoporosis     Current Outpatient Medications  Medication Sig Dispense Refill   ALPRAZolam (XANAX) 0.25 MG tablet As needed for flying     atorvastatin (LIPITOR) 10 MG tablet Take 10 mg by mouth daily. Takes M-W-F     Cholecalciferol (VITAMIN D) 2000 UNITS tablet Take 1,000 Units by mouth daily.      hydroxypropyl methylcellulose (ISOPTO TEARS) 2.5 % ophthalmic solution Place 1 drop into both eyes daily.     ibuprofen (ADVIL,MOTRIN) 200 MG tablet Take 200-400 mg by mouth every 8 (eight) hours as needed for pain or headache.     LINZESS 72 MCG capsule      meclizine (ANTIVERT) 25 MG tablet Take 25 mg by mouth 3 (three) times daily as needed for dizziness.      meloxicam (MOBIC) 15 MG tablet Take 15 mg by mouth daily as needed for pain.      Multiple Vitamin  (MULTIVITAMIN WITH MINERALS) TABS tablet Take 1 tablet by mouth See admin instructions. Takes daily on mondays, wednesdays, fridays, and saturdays     Omega-3 Krill Oil 500 MG CAPS Take 1 capsule by mouth daily.     omeprazole (PRILOSEC) 10 MG capsule as needed.     Omeprazole Magnesium (PRILOSEC OTC PO) Take 1 tablet by mouth daily as needed (reflux).     raloxifene (EVISTA) 60 MG tablet Take 60 mg by mouth See admin instructions. Takes 60mg  on mondays, wednesdays, fridays, saturdays     No current facility-administered medications for this visit.    Physical Exam BP 135/70 (BP Location: Right Arm, Patient Position: Sitting)    Pulse 80    Resp 20    Ht 5\' 7"  (1.702 m)    Wt 145 lb (65.8 kg)    SpO2 91% Comment: RA with mask on   BMI 22.47 kg/m  79 year old woman in no acute distress Alert and oriented x3 with no focal deficits Lungs clear with equal breath sounds bilaterally No cervical or supraclavicular adenopathy Cardiac regular rate and rhythm with normal S1 and S2  Diagnostic Tests: CT CHEST WITHOUT CONTRAST  TECHNIQUE: Multidetector CT imaging of the chest was performed following the standard protocol without  IV contrast.  COMPARISON:  03/24/2019  FINDINGS: Cardiovascular: The heart size is normal. No substantial pericardial effusion. Coronary artery calcification is evident. Atherosclerotic calcification is noted in the wall of the thoracic aorta.  Mediastinum/Nodes: No mediastinal lymphadenopathy. No evidence for gross hilar lymphadenopathy although assessment is limited by the lack of intravenous contrast on today's study. The esophagus has normal imaging features. There is no axillary lymphadenopathy.  Lungs/Pleura: Centrilobular emphsyema noted. Surgical changes noted right lung. 3 mm left upper lobe pulmonary nodule (28/8) is stable since prior study and comparing back to an exam from 03/11/2018. Tiny sub solid nodule anterior left lower lobe on 74/8 is  also unchanged since 2019. No new suspicious pulmonary nodule or mass. No focal airspace consolidation. No pleural effusion.  Upper Abdomen: Unremarkable.  Musculoskeletal: No worrisome lytic or sclerotic osseous abnormality.  IMPRESSION: 1. Stable exam. No new or progressive findings. 2. 3 mm left upper lobe pulmonary nodule is stable since 03/11/2018. 3. Aortic Atherosclerosis (ICD10-I70.0) and Emphysema (ICD10-J43.9).   Electronically Signed   By: Misty Stanley M.D.   On: 04/05/2020 13:53 I personally reviewed the CT images and concur with the findings noted above.  Impression: [name] is a 79 year old former smoker with a history of a stage Ia non-small cell carcinoma, hyperlipidemia, coronary atherosclerosis, thoracic aortic atherosclerosis, and osteoporosis.  Stage Ia adenocarcinoma right lower lobe-status post superior segmentectomy in 2014. Now 7 years out with no evidence of recurrent disease.  Tobacco abuse-quit smoking in 2007.  Coronary and thoracic aortic atherosclerosis on CT-asymptomatic. She is on a statin. Blood pressure well controlled. She did have some concerns about this. Given that she is asymptomatic at all but there is any significant work-up from necessary at present. This is a common finding on CTs and people in her age group. If she were to have any chest discomfort or shortness of breath with exertion she should be seen by cardiologist.  Plan: Return in 1 year CT chest  Melrose Nakayama, MD [facility] [phone]

## 2020-04-28 DIAGNOSIS — K219 Gastro-esophageal reflux disease without esophagitis: Secondary | ICD-10-CM | POA: Diagnosis not present

## 2020-04-28 DIAGNOSIS — F339 Major depressive disorder, recurrent, unspecified: Secondary | ICD-10-CM | POA: Diagnosis not present

## 2020-04-28 DIAGNOSIS — Z85118 Personal history of other malignant neoplasm of bronchus and lung: Secondary | ICD-10-CM | POA: Diagnosis not present

## 2020-04-28 DIAGNOSIS — J439 Emphysema, unspecified: Secondary | ICD-10-CM | POA: Diagnosis not present

## 2020-04-28 DIAGNOSIS — E7800 Pure hypercholesterolemia, unspecified: Secondary | ICD-10-CM | POA: Diagnosis not present

## 2020-04-28 DIAGNOSIS — M810 Age-related osteoporosis without current pathological fracture: Secondary | ICD-10-CM | POA: Diagnosis not present

## 2020-05-12 DIAGNOSIS — Z01419 Encounter for gynecological examination (general) (routine) without abnormal findings: Secondary | ICD-10-CM | POA: Diagnosis not present

## 2020-05-12 DIAGNOSIS — M810 Age-related osteoporosis without current pathological fracture: Secondary | ICD-10-CM | POA: Diagnosis not present

## 2020-05-12 DIAGNOSIS — Z23 Encounter for immunization: Secondary | ICD-10-CM | POA: Diagnosis not present

## 2020-05-23 DIAGNOSIS — K219 Gastro-esophageal reflux disease without esophagitis: Secondary | ICD-10-CM | POA: Diagnosis not present

## 2020-05-23 DIAGNOSIS — E7800 Pure hypercholesterolemia, unspecified: Secondary | ICD-10-CM | POA: Diagnosis not present

## 2020-05-23 DIAGNOSIS — Z85118 Personal history of other malignant neoplasm of bronchus and lung: Secondary | ICD-10-CM | POA: Diagnosis not present

## 2020-05-23 DIAGNOSIS — F339 Major depressive disorder, recurrent, unspecified: Secondary | ICD-10-CM | POA: Diagnosis not present

## 2020-05-23 DIAGNOSIS — J439 Emphysema, unspecified: Secondary | ICD-10-CM | POA: Diagnosis not present

## 2020-05-23 DIAGNOSIS — M810 Age-related osteoporosis without current pathological fracture: Secondary | ICD-10-CM | POA: Diagnosis not present

## 2020-06-14 DIAGNOSIS — Z6821 Body mass index (BMI) 21.0-21.9, adult: Secondary | ICD-10-CM | POA: Diagnosis not present

## 2020-06-14 DIAGNOSIS — M159 Polyosteoarthritis, unspecified: Secondary | ICD-10-CM | POA: Diagnosis not present

## 2020-06-14 DIAGNOSIS — M2550 Pain in unspecified joint: Secondary | ICD-10-CM | POA: Diagnosis not present

## 2020-06-14 DIAGNOSIS — M79674 Pain in right toe(s): Secondary | ICD-10-CM | POA: Diagnosis not present

## 2020-06-14 DIAGNOSIS — M353 Polymyalgia rheumatica: Secondary | ICD-10-CM | POA: Diagnosis not present

## 2020-06-28 DIAGNOSIS — E7800 Pure hypercholesterolemia, unspecified: Secondary | ICD-10-CM | POA: Diagnosis not present

## 2020-06-28 DIAGNOSIS — K219 Gastro-esophageal reflux disease without esophagitis: Secondary | ICD-10-CM | POA: Diagnosis not present

## 2020-06-28 DIAGNOSIS — J439 Emphysema, unspecified: Secondary | ICD-10-CM | POA: Diagnosis not present

## 2020-06-28 DIAGNOSIS — Z85118 Personal history of other malignant neoplasm of bronchus and lung: Secondary | ICD-10-CM | POA: Diagnosis not present

## 2020-06-28 DIAGNOSIS — M810 Age-related osteoporosis without current pathological fracture: Secondary | ICD-10-CM | POA: Diagnosis not present

## 2020-06-28 DIAGNOSIS — F339 Major depressive disorder, recurrent, unspecified: Secondary | ICD-10-CM | POA: Diagnosis not present

## 2020-08-02 DIAGNOSIS — M62830 Muscle spasm of back: Secondary | ICD-10-CM | POA: Diagnosis not present

## 2020-08-02 DIAGNOSIS — M533 Sacrococcygeal disorders, not elsewhere classified: Secondary | ICD-10-CM | POA: Diagnosis not present

## 2020-08-24 DIAGNOSIS — M810 Age-related osteoporosis without current pathological fracture: Secondary | ICD-10-CM | POA: Diagnosis not present

## 2020-08-24 DIAGNOSIS — E7800 Pure hypercholesterolemia, unspecified: Secondary | ICD-10-CM | POA: Diagnosis not present

## 2020-08-24 DIAGNOSIS — Z85118 Personal history of other malignant neoplasm of bronchus and lung: Secondary | ICD-10-CM | POA: Diagnosis not present

## 2020-08-24 DIAGNOSIS — K219 Gastro-esophageal reflux disease without esophagitis: Secondary | ICD-10-CM | POA: Diagnosis not present

## 2020-08-24 DIAGNOSIS — F339 Major depressive disorder, recurrent, unspecified: Secondary | ICD-10-CM | POA: Diagnosis not present

## 2020-08-24 DIAGNOSIS — J439 Emphysema, unspecified: Secondary | ICD-10-CM | POA: Diagnosis not present

## 2020-10-06 DIAGNOSIS — E7800 Pure hypercholesterolemia, unspecified: Secondary | ICD-10-CM | POA: Diagnosis not present

## 2020-10-06 DIAGNOSIS — Z0000 Encounter for general adult medical examination without abnormal findings: Secondary | ICD-10-CM | POA: Diagnosis not present

## 2020-10-06 DIAGNOSIS — Z1389 Encounter for screening for other disorder: Secondary | ICD-10-CM | POA: Diagnosis not present

## 2020-10-06 DIAGNOSIS — M791 Myalgia, unspecified site: Secondary | ICD-10-CM | POA: Diagnosis not present

## 2020-10-06 DIAGNOSIS — R7309 Other abnormal glucose: Secondary | ICD-10-CM | POA: Diagnosis not present

## 2020-10-06 DIAGNOSIS — E559 Vitamin D deficiency, unspecified: Secondary | ICD-10-CM | POA: Diagnosis not present

## 2020-10-10 DIAGNOSIS — F419 Anxiety disorder, unspecified: Secondary | ICD-10-CM | POA: Diagnosis not present

## 2020-10-10 DIAGNOSIS — J310 Chronic rhinitis: Secondary | ICD-10-CM | POA: Diagnosis not present

## 2020-10-10 DIAGNOSIS — R7303 Prediabetes: Secondary | ICD-10-CM | POA: Diagnosis not present

## 2020-10-10 DIAGNOSIS — K219 Gastro-esophageal reflux disease without esophagitis: Secondary | ICD-10-CM | POA: Diagnosis not present

## 2020-10-10 DIAGNOSIS — M533 Sacrococcygeal disorders, not elsewhere classified: Secondary | ICD-10-CM | POA: Diagnosis not present

## 2020-10-10 DIAGNOSIS — F339 Major depressive disorder, recurrent, unspecified: Secondary | ICD-10-CM | POA: Diagnosis not present

## 2020-10-10 DIAGNOSIS — M810 Age-related osteoporosis without current pathological fracture: Secondary | ICD-10-CM | POA: Diagnosis not present

## 2020-10-10 DIAGNOSIS — M791 Myalgia, unspecified site: Secondary | ICD-10-CM | POA: Diagnosis not present

## 2020-10-10 DIAGNOSIS — E7800 Pure hypercholesterolemia, unspecified: Secondary | ICD-10-CM | POA: Diagnosis not present

## 2020-10-10 DIAGNOSIS — E559 Vitamin D deficiency, unspecified: Secondary | ICD-10-CM | POA: Diagnosis not present

## 2020-10-10 DIAGNOSIS — K5909 Other constipation: Secondary | ICD-10-CM | POA: Diagnosis not present

## 2020-12-05 DIAGNOSIS — K219 Gastro-esophageal reflux disease without esophagitis: Secondary | ICD-10-CM | POA: Diagnosis not present

## 2020-12-05 DIAGNOSIS — E7800 Pure hypercholesterolemia, unspecified: Secondary | ICD-10-CM | POA: Diagnosis not present

## 2020-12-05 DIAGNOSIS — J439 Emphysema, unspecified: Secondary | ICD-10-CM | POA: Diagnosis not present

## 2020-12-05 DIAGNOSIS — F339 Major depressive disorder, recurrent, unspecified: Secondary | ICD-10-CM | POA: Diagnosis not present

## 2020-12-05 DIAGNOSIS — M810 Age-related osteoporosis without current pathological fracture: Secondary | ICD-10-CM | POA: Diagnosis not present

## 2020-12-13 DIAGNOSIS — H524 Presbyopia: Secondary | ICD-10-CM | POA: Diagnosis not present

## 2020-12-13 DIAGNOSIS — H52203 Unspecified astigmatism, bilateral: Secondary | ICD-10-CM | POA: Diagnosis not present

## 2020-12-13 DIAGNOSIS — Z961 Presence of intraocular lens: Secondary | ICD-10-CM | POA: Diagnosis not present

## 2020-12-16 DIAGNOSIS — R42 Dizziness and giddiness: Secondary | ICD-10-CM | POA: Diagnosis not present

## 2020-12-31 DIAGNOSIS — Z23 Encounter for immunization: Secondary | ICD-10-CM | POA: Diagnosis not present

## 2021-01-16 ENCOUNTER — Other Ambulatory Visit: Payer: Self-pay | Admitting: Obstetrics and Gynecology

## 2021-01-16 DIAGNOSIS — Z1231 Encounter for screening mammogram for malignant neoplasm of breast: Secondary | ICD-10-CM

## 2021-01-31 DIAGNOSIS — F339 Major depressive disorder, recurrent, unspecified: Secondary | ICD-10-CM | POA: Diagnosis not present

## 2021-01-31 DIAGNOSIS — E7800 Pure hypercholesterolemia, unspecified: Secondary | ICD-10-CM | POA: Diagnosis not present

## 2021-01-31 DIAGNOSIS — J439 Emphysema, unspecified: Secondary | ICD-10-CM | POA: Diagnosis not present

## 2021-01-31 DIAGNOSIS — K219 Gastro-esophageal reflux disease without esophagitis: Secondary | ICD-10-CM | POA: Diagnosis not present

## 2021-01-31 DIAGNOSIS — M810 Age-related osteoporosis without current pathological fracture: Secondary | ICD-10-CM | POA: Diagnosis not present

## 2021-02-24 ENCOUNTER — Ambulatory Visit: Payer: Medicare Other

## 2021-02-27 ENCOUNTER — Other Ambulatory Visit: Payer: Self-pay | Admitting: Thoracic Surgery (Cardiothoracic Vascular Surgery)

## 2021-02-27 DIAGNOSIS — C3491 Malignant neoplasm of unspecified part of right bronchus or lung: Secondary | ICD-10-CM

## 2021-03-30 DIAGNOSIS — L814 Other melanin hyperpigmentation: Secondary | ICD-10-CM | POA: Diagnosis not present

## 2021-03-30 DIAGNOSIS — Z23 Encounter for immunization: Secondary | ICD-10-CM | POA: Diagnosis not present

## 2021-03-30 DIAGNOSIS — L578 Other skin changes due to chronic exposure to nonionizing radiation: Secondary | ICD-10-CM | POA: Diagnosis not present

## 2021-03-30 DIAGNOSIS — L658 Other specified nonscarring hair loss: Secondary | ICD-10-CM | POA: Diagnosis not present

## 2021-03-30 DIAGNOSIS — D225 Melanocytic nevi of trunk: Secondary | ICD-10-CM | POA: Diagnosis not present

## 2021-03-30 DIAGNOSIS — L603 Nail dystrophy: Secondary | ICD-10-CM | POA: Diagnosis not present

## 2021-03-30 DIAGNOSIS — L821 Other seborrheic keratosis: Secondary | ICD-10-CM | POA: Diagnosis not present

## 2021-03-31 ENCOUNTER — Ambulatory Visit
Admission: RE | Admit: 2021-03-31 | Discharge: 2021-03-31 | Disposition: A | Discharge status: Home / Self Care | Payer: Medicare Other | Source: Ambulatory Visit | Attending: Thoracic Surgery (Cardiothoracic Vascular Surgery) | Admitting: Thoracic Surgery (Cardiothoracic Vascular Surgery)

## 2021-03-31 DIAGNOSIS — R911 Solitary pulmonary nodule: Secondary | ICD-10-CM | POA: Diagnosis not present

## 2021-03-31 DIAGNOSIS — J439 Emphysema, unspecified: Secondary | ICD-10-CM | POA: Diagnosis not present

## 2021-03-31 DIAGNOSIS — C3491 Malignant neoplasm of unspecified part of right bronchus or lung: Secondary | ICD-10-CM

## 2021-03-31 DIAGNOSIS — I700 Atherosclerosis of aorta: Secondary | ICD-10-CM | POA: Diagnosis not present

## 2021-04-04 ENCOUNTER — Encounter: Payer: Self-pay | Admitting: Thoracic Surgery (Cardiothoracic Vascular Surgery)

## 2021-04-04 ENCOUNTER — Ambulatory Visit
Admission: RE | Admit: 2021-04-04 | Discharge: 2021-04-04 | Disposition: A | Discharge status: Home / Self Care | Payer: Medicare Other | Source: Ambulatory Visit | Attending: Obstetrics and Gynecology | Admitting: Obstetrics and Gynecology

## 2021-04-04 ENCOUNTER — Other Ambulatory Visit: Payer: Self-pay

## 2021-04-04 ENCOUNTER — Ambulatory Visit (INDEPENDENT_AMBULATORY_CARE_PROVIDER_SITE_OTHER): Payer: Medicare Other | Admitting: Thoracic Surgery (Cardiothoracic Vascular Surgery)

## 2021-04-04 VITALS — BP 139/74 | HR 65 | Resp 20 | Ht 67.0 in | Wt 149.0 lb

## 2021-04-04 DIAGNOSIS — Z1231 Encounter for screening mammogram for malignant neoplasm of breast: Secondary | ICD-10-CM | POA: Diagnosis not present

## 2021-04-04 DIAGNOSIS — D225 Melanocytic nevi of trunk: Secondary | ICD-10-CM | POA: Insufficient documentation

## 2021-04-04 DIAGNOSIS — L821 Other seborrheic keratosis: Secondary | ICD-10-CM | POA: Insufficient documentation

## 2021-04-04 DIAGNOSIS — L814 Other melanin hyperpigmentation: Secondary | ICD-10-CM | POA: Insufficient documentation

## 2021-04-04 DIAGNOSIS — Z85118 Personal history of other malignant neoplasm of bronchus and lung: Secondary | ICD-10-CM | POA: Diagnosis not present

## 2021-04-04 NOTE — Progress Notes (Signed)
[address]       [place] [address]             [phone]     HPI: [name] returns for an annual follow-up visit   [name] is an 80 year old woman with a history of tobacco use, COPD, stage Ia non-small cell carcinoma, hyperlipidemia, coronary and thoracic aortic atherosclerosis, and osteoporosis.  She quit smoking in 2007.  She had a right lower lobe superior segmentectomy in October 2014 for stage Ia adenocarcinoma.  She has been followed since then.  She overall is feeling well.  She is anxious about her scan results.  She is not having any chest pain, pressure, or tightness that suggestive of angina.  She has an occasional discomfort under her left breast is not associated with any exertion.  She also says she occasionally gets short of breath.  Appetite is good and weight is stable.  Past Medical History:  Diagnosis Date   Arthritis    GERD (gastroesophageal reflux disease)    H/O vitamin D deficiency    Hypercholesteremia    Hyperplastic polyps of stomach    Joint pain    Non-small cell cancer of right lung ([place]) 01/2013   Stage IA non small cell   Osteoporosis     Current Outpatient Medications  Medication Sig Dispense Refill   ALPRAZolam (XANAX) 0.25 MG tablet As needed for flying     atorvastatin (LIPITOR) 10 MG tablet Take 10 mg by mouth daily. Takes M-W-F     Cholecalciferol (VITAMIN D) 2000 UNITS tablet Take 1,000 Units by mouth daily.      hydroxypropyl methylcellulose (ISOPTO TEARS) 2.5 % ophthalmic solution Place 1 drop into both eyes daily.     ibuprofen (ADVIL,MOTRIN) 200 MG tablet Take 200-400 mg by mouth every 8 (eight) hours as needed for pain or headache.     LINZESS 72 MCG capsule      meclizine (ANTIVERT) 25 MG tablet Take 25 mg by mouth 3 (three) times daily as needed for dizziness.      meloxicam (MOBIC) 15 MG tablet Take 15 mg by mouth daily as needed for pain.      Multiple Vitamin (MULTIVITAMIN WITH MINERALS) TABS  tablet Take 1 tablet by mouth See admin instructions. Takes daily on mondays, wednesdays, fridays, and saturdays     Omega-3 Krill Oil 500 MG CAPS Take 1 capsule by mouth daily.     omeprazole (PRILOSEC) 10 MG capsule as needed.     Omeprazole Magnesium (PRILOSEC OTC PO) Take 1 tablet by mouth daily as needed (reflux).     raloxifene (EVISTA) 60 MG tablet Take 60 mg by mouth See admin instructions. Takes 60mg  on mondays, wednesdays, fridays, saturdays     No current facility-administered medications for this visit.    Physical Exam BP 139/74    Pulse 65    Resp 20    Ht 5\' 7"  (1.702 m)    Wt 149 lb (67.6 kg)    SpO2 95% Comment: RA   BMI 23.68 kg/m  80 year old woman in no acute distress Alert and oriented x3 with no focal deficits Lungs clear bilaterally, no rales or wheezing No cervical or supraclavicular adenopathy Cardiac regular rate and rhythm  Diagnostic Tests: CT CHEST WITHOUT CONTRAST   TECHNIQUE: Multidetector CT imaging of the chest was performed following the standard protocol without IV contrast.   COMPARISON:  Comparison is made with April 05, 2020.   FINDINGS:  Cardiovascular: Calcified atheromatous plaque of the thoracic aorta. Calcified coronary artery disease. Normal heart size. Normal caliber of central pulmonary vasculature. Limited assessment of cardiovascular structures given lack of intravenous contrast.   Mediastinum/Nodes: No thoracic inlet, axillary, mediastinal or hilar adenopathy. Esophagus grossly normal.   Lungs/Pleura: Pulmonary emphysema worse at the lung apices similar to prior imaging. Postoperative changes in the RIGHT lower lobe and along the lateral aspect of the RIGHT middle lobe with pleural and parenchymal scarring. No signs of disease recurrence in the RIGHT chest.   Stable small nodule in the LEFT lower lobe (image 72/8) 3 mm.   Stable LEFT apical nodule (image 25/8) 3 mm.   Upper Abdomen: Incidental imaging of upper abdominal  contents without acute process.   Musculoskeletal: No acute bone finding. No destructive bone process. Spinal degenerative changes.   IMPRESSION: Postoperative changes in the RIGHT lower lobe and along the lateral aspect of the RIGHT middle lobe with pleural and parenchymal scarring. No signs of disease recurrence in the RIGHT chest.   Stable small nodules in the LEFT lower lobe and LEFT apical nodule.   Emphysema and aortic atherosclerosis.   Calcified coronary artery disease.   Aortic Atherosclerosis (ICD10-I70.0) and Emphysema (ICD10-J43.9).     Electronically Signed   By: Zetta Bills M.D.   On: 03/31/2021 11:22 I personally reviewed the CT images.  No suspicious lung nodules.  Stable small left lung nodules.  Coronary and thoracic aortic atherosclerosis.  Centrilobular emphysema.  Impression: [name] is a 80 year old woman with a history of tobacco use, COPD, stage Ia non-small cell carcinoma, hyperlipidemia, coronary and thoracic aortic atherosclerosis, and osteoporosis.   Stage Ia adenocarcinoma-now 8 years out from right lower lobe superior segmentectomy.  No evidence of recurrent disease.  Tobacco-quit smoking in 2007.  Emphysema-CT shows emphysema.  No significant change from prior.  Not really symptomatic.  Thoracic aortic and coronary atherosclerosis-again asymptomatic.  Not an unusual finding in her age group.  She is not having any anginal symptoms.  She is on a statin.  I emphasized that although the CT documents the fact there is calcification in her coronary arteries it is not qualitative in any respect, so does not provide any information about severity.  Advised her to discuss with Dr. Addison Lank if she would like to have additional testing done but given the fact that she is asymptomatic there is no urgent indication.   Plan: She will discuss further with primary if she wants to pursue any additional imaging regarding her heart Follow-up in 1 year with CT  chest  Melrose Nakayama, MD [facility] [phone]

## 2021-04-05 ENCOUNTER — Other Ambulatory Visit: Payer: Self-pay | Admitting: Obstetrics and Gynecology

## 2021-04-05 DIAGNOSIS — K219 Gastro-esophageal reflux disease without esophagitis: Secondary | ICD-10-CM | POA: Diagnosis not present

## 2021-04-05 DIAGNOSIS — F339 Major depressive disorder, recurrent, unspecified: Secondary | ICD-10-CM | POA: Diagnosis not present

## 2021-04-05 DIAGNOSIS — J439 Emphysema, unspecified: Secondary | ICD-10-CM | POA: Diagnosis not present

## 2021-04-05 DIAGNOSIS — E7800 Pure hypercholesterolemia, unspecified: Secondary | ICD-10-CM | POA: Diagnosis not present

## 2021-04-05 DIAGNOSIS — R928 Other abnormal and inconclusive findings on diagnostic imaging of breast: Secondary | ICD-10-CM

## 2021-04-05 DIAGNOSIS — M810 Age-related osteoporosis without current pathological fracture: Secondary | ICD-10-CM | POA: Diagnosis not present

## 2021-04-18 DIAGNOSIS — E7800 Pure hypercholesterolemia, unspecified: Secondary | ICD-10-CM | POA: Diagnosis not present

## 2021-04-18 DIAGNOSIS — R7303 Prediabetes: Secondary | ICD-10-CM | POA: Diagnosis not present

## 2021-04-25 DIAGNOSIS — J310 Chronic rhinitis: Secondary | ICD-10-CM | POA: Diagnosis not present

## 2021-04-25 DIAGNOSIS — E7800 Pure hypercholesterolemia, unspecified: Secondary | ICD-10-CM | POA: Diagnosis not present

## 2021-04-25 DIAGNOSIS — K5909 Other constipation: Secondary | ICD-10-CM | POA: Diagnosis not present

## 2021-04-25 DIAGNOSIS — E559 Vitamin D deficiency, unspecified: Secondary | ICD-10-CM | POA: Diagnosis not present

## 2021-04-25 DIAGNOSIS — M810 Age-related osteoporosis without current pathological fracture: Secondary | ICD-10-CM | POA: Diagnosis not present

## 2021-04-25 DIAGNOSIS — R7303 Prediabetes: Secondary | ICD-10-CM | POA: Diagnosis not present

## 2021-04-25 DIAGNOSIS — F419 Anxiety disorder, unspecified: Secondary | ICD-10-CM | POA: Diagnosis not present

## 2021-04-25 DIAGNOSIS — K219 Gastro-esophageal reflux disease without esophagitis: Secondary | ICD-10-CM | POA: Diagnosis not present

## 2021-04-25 DIAGNOSIS — M75102 Unspecified rotator cuff tear or rupture of left shoulder, not specified as traumatic: Secondary | ICD-10-CM | POA: Diagnosis not present

## 2021-04-25 DIAGNOSIS — M791 Myalgia, unspecified site: Secondary | ICD-10-CM | POA: Diagnosis not present

## 2021-05-12 ENCOUNTER — Ambulatory Visit
Admission: RE | Admit: 2021-05-12 | Discharge: 2021-05-12 | Disposition: A | Discharge status: Home / Self Care | Payer: Medicare Other | Source: Ambulatory Visit | Attending: Obstetrics and Gynecology | Admitting: Obstetrics and Gynecology

## 2021-05-12 ENCOUNTER — Ambulatory Visit: Payer: Medicare Other

## 2021-05-12 DIAGNOSIS — R922 Inconclusive mammogram: Secondary | ICD-10-CM | POA: Diagnosis not present

## 2021-05-12 DIAGNOSIS — R928 Other abnormal and inconclusive findings on diagnostic imaging of breast: Secondary | ICD-10-CM

## 2021-05-15 DIAGNOSIS — Z01419 Encounter for gynecological examination (general) (routine) without abnormal findings: Secondary | ICD-10-CM | POA: Diagnosis not present

## 2021-05-16 ENCOUNTER — Other Ambulatory Visit: Payer: Medicare Other

## 2021-06-28 DIAGNOSIS — Z6823 Body mass index (BMI) 23.0-23.9, adult: Secondary | ICD-10-CM | POA: Diagnosis not present

## 2021-06-28 DIAGNOSIS — M1991 Primary osteoarthritis, unspecified site: Secondary | ICD-10-CM | POA: Diagnosis not present

## 2021-06-28 DIAGNOSIS — M25512 Pain in left shoulder: Secondary | ICD-10-CM | POA: Diagnosis not present

## 2021-06-28 DIAGNOSIS — M791 Myalgia, unspecified site: Secondary | ICD-10-CM | POA: Diagnosis not present

## 2021-06-28 DIAGNOSIS — M353 Polymyalgia rheumatica: Secondary | ICD-10-CM | POA: Diagnosis not present

## 2021-06-28 DIAGNOSIS — M545 Low back pain, unspecified: Secondary | ICD-10-CM | POA: Diagnosis not present

## 2021-06-28 DIAGNOSIS — M810 Age-related osteoporosis without current pathological fracture: Secondary | ICD-10-CM | POA: Diagnosis not present

## 2021-07-13 DIAGNOSIS — M810 Age-related osteoporosis without current pathological fracture: Secondary | ICD-10-CM | POA: Diagnosis not present

## 2021-08-17 DIAGNOSIS — Z1159 Encounter for screening for other viral diseases: Secondary | ICD-10-CM | POA: Diagnosis not present

## 2021-08-23 IMAGING — MG DIGITAL SCREENING BILAT W/ TOMO W/ CAD
8 series · 9 of 24 positions shown · non-contrast
Comparison: Previous exam(s).

CLINICAL DATA: Screening.

EXAM:
DIGITAL SCREENING BILATERAL MAMMOGRAM WITH TOMO AND CAD

[R CC synth-2D]
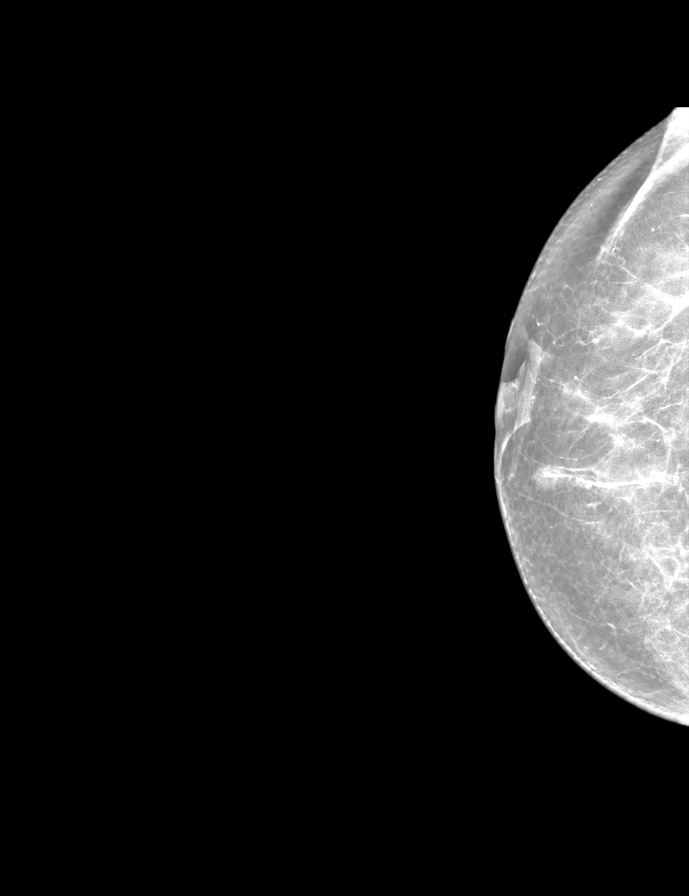

[L CC synth-2D]
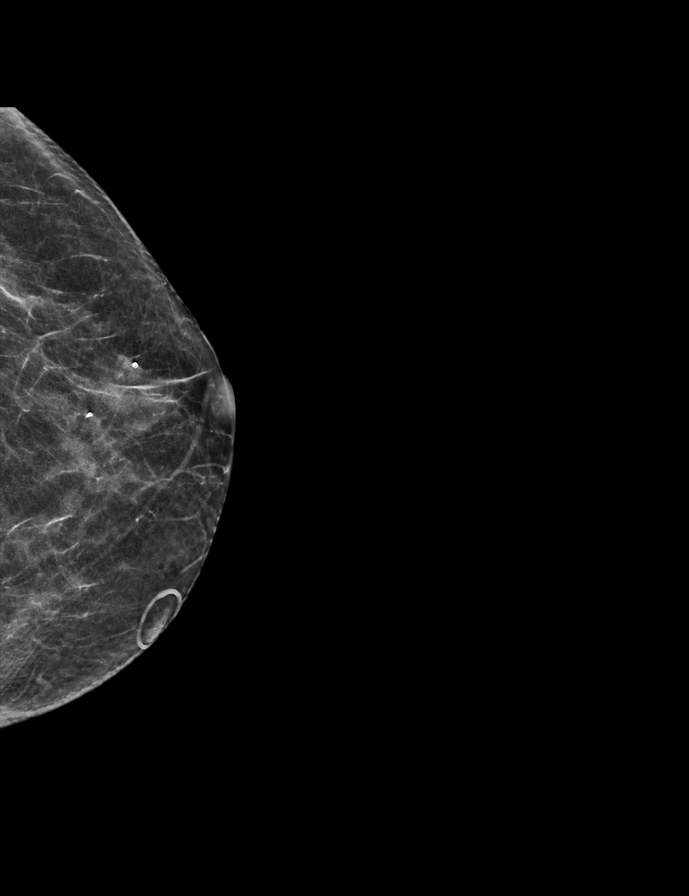

[R MLO synth-2D]
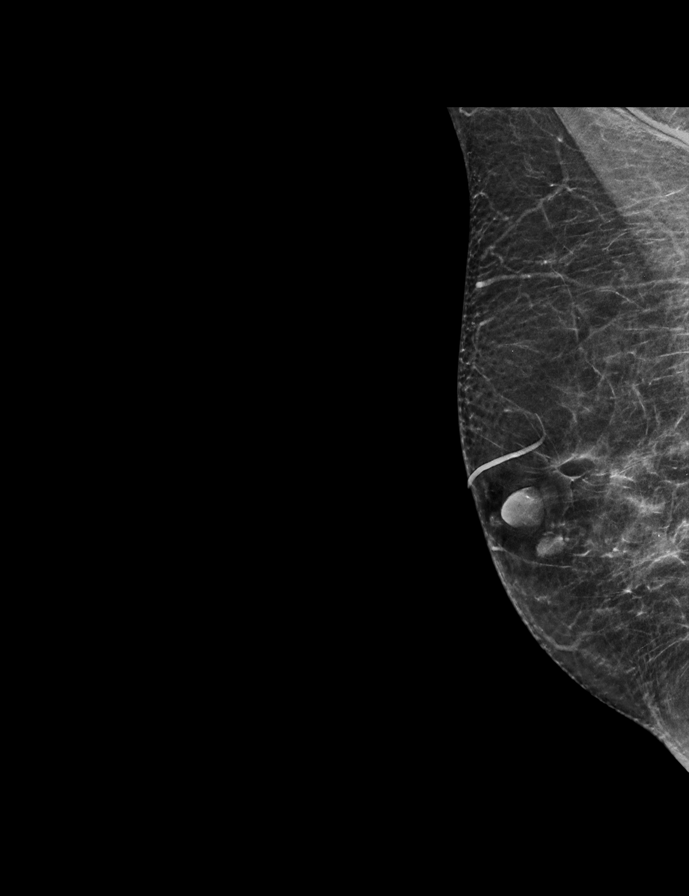

[L MLO synth-2D]
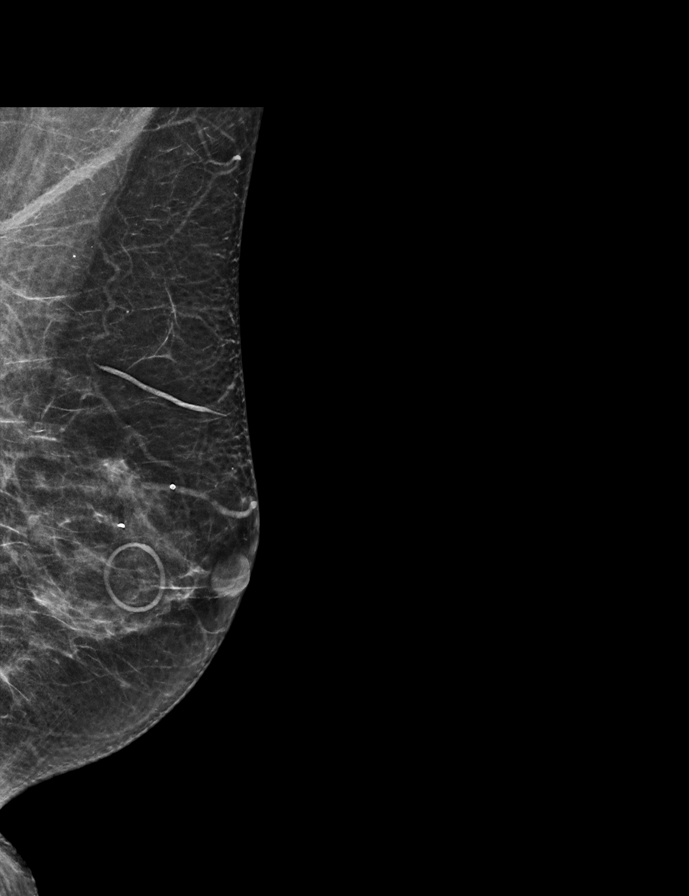

[R MLO tomo · 2 of 54 frames shown]
[frame 18/54]
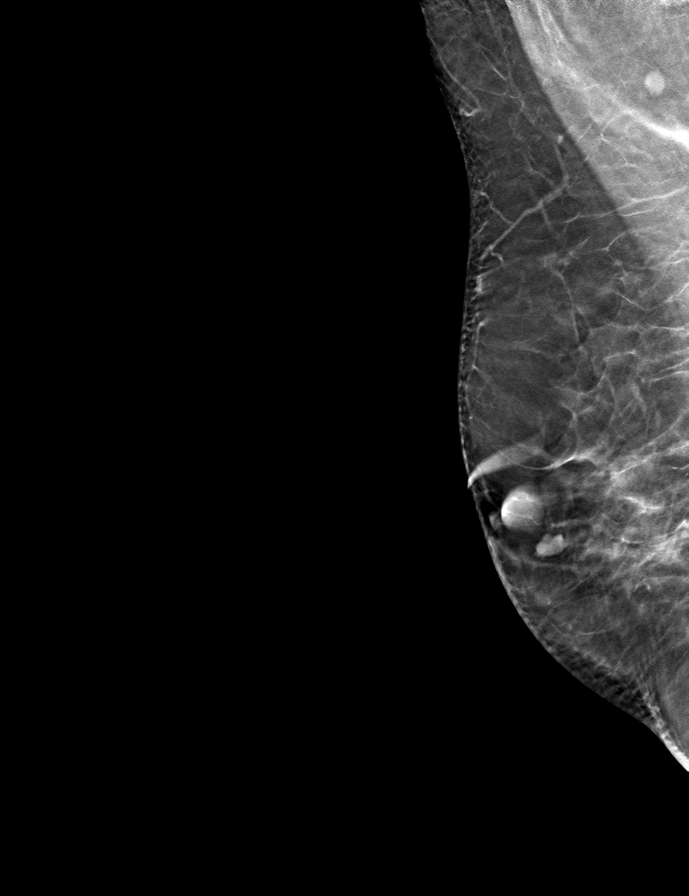
[frame 27/54]
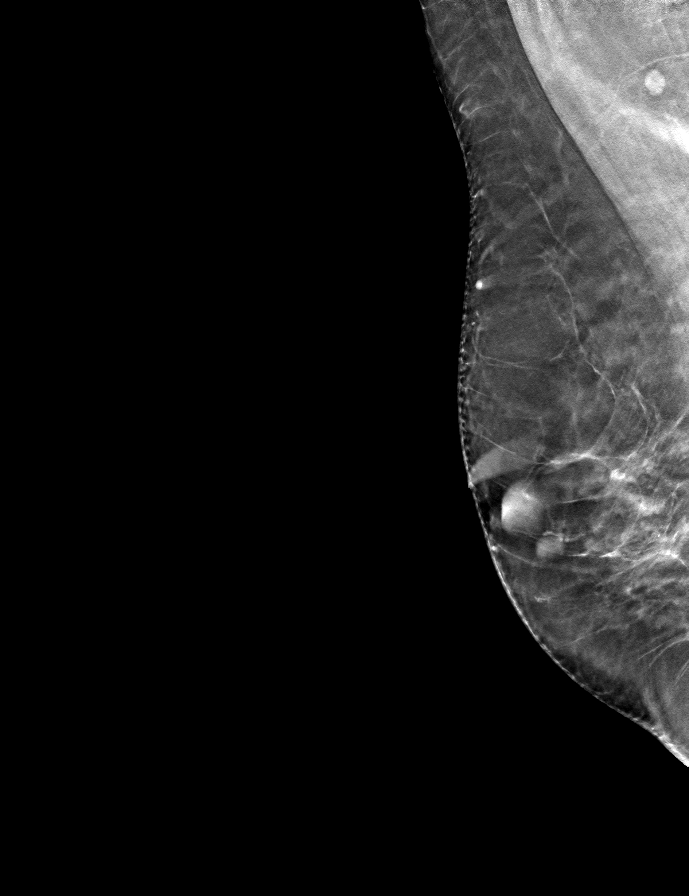

[L CC tomo · tomo slice 26/51.0]
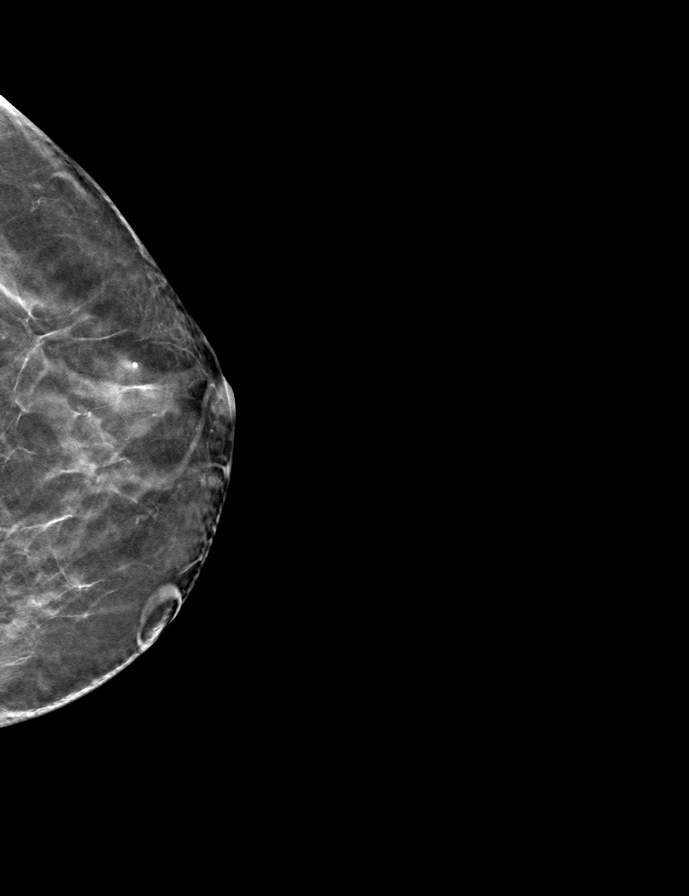

[R CC tomo · tomo slice 29/58.0]
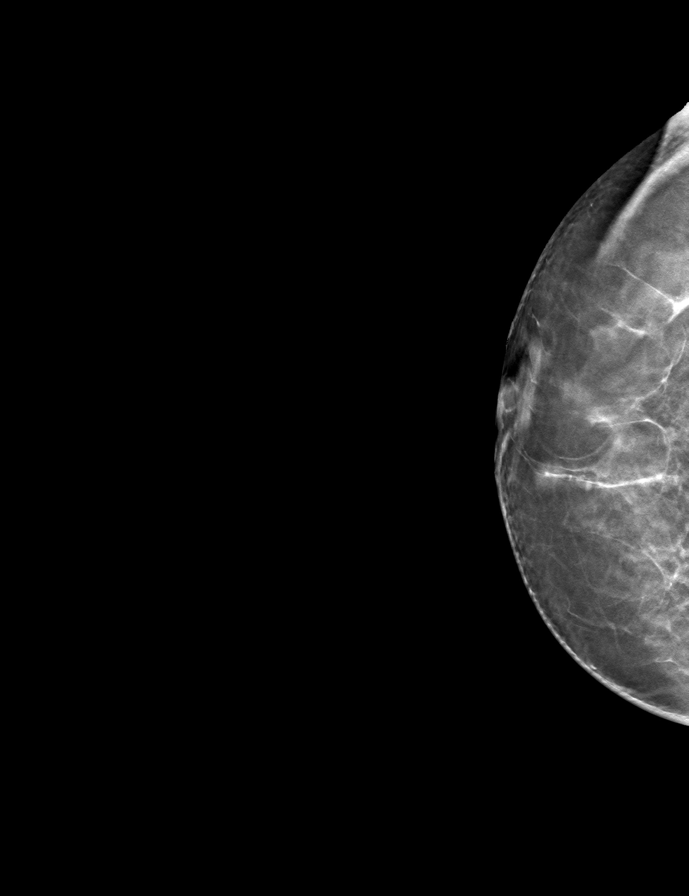

[L MLO tomo · tomo slice 28/55.0]
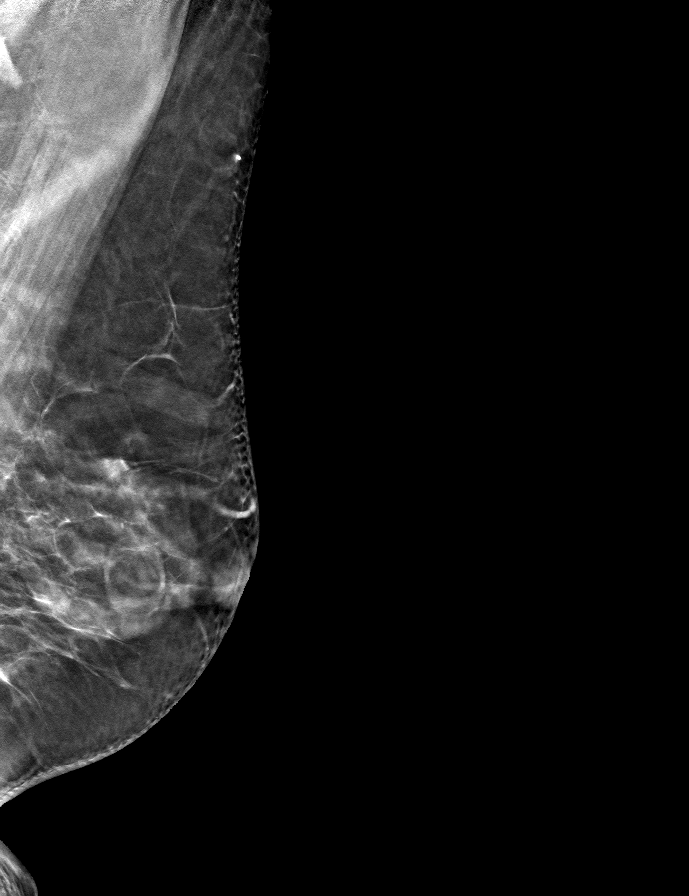

[9 of 24 positions shown; findings below may reference images not displayed]

ACR Breast Density Category b: There are scattered areas of
fibroglandular density.
FINDINGS: There are no findings suspicious for malignancy. Images were
processed with CAD.
IMPRESSION: No mammographic evidence of malignancy. A result letter of this
screening mammogram will be mailed directly to the patient.

RECOMMENDATION:
Screening mammogram in one year. (Code:[id number])

BI-RADS CATEGORY  1: Negative.

## 2021-09-06 DIAGNOSIS — E7800 Pure hypercholesterolemia, unspecified: Secondary | ICD-10-CM | POA: Diagnosis not present

## 2021-09-06 DIAGNOSIS — F339 Major depressive disorder, recurrent, unspecified: Secondary | ICD-10-CM | POA: Diagnosis not present

## 2021-09-06 DIAGNOSIS — K219 Gastro-esophageal reflux disease without esophagitis: Secondary | ICD-10-CM | POA: Diagnosis not present

## 2021-09-06 DIAGNOSIS — M810 Age-related osteoporosis without current pathological fracture: Secondary | ICD-10-CM | POA: Diagnosis not present

## 2021-10-12 DIAGNOSIS — Z0000 Encounter for general adult medical examination without abnormal findings: Secondary | ICD-10-CM | POA: Diagnosis not present

## 2021-10-12 DIAGNOSIS — E559 Vitamin D deficiency, unspecified: Secondary | ICD-10-CM | POA: Diagnosis not present

## 2021-10-12 DIAGNOSIS — Z1389 Encounter for screening for other disorder: Secondary | ICD-10-CM | POA: Diagnosis not present

## 2021-10-12 DIAGNOSIS — R7309 Other abnormal glucose: Secondary | ICD-10-CM | POA: Diagnosis not present

## 2021-10-12 DIAGNOSIS — E7800 Pure hypercholesterolemia, unspecified: Secondary | ICD-10-CM | POA: Diagnosis not present

## 2021-10-13 ENCOUNTER — Other Ambulatory Visit: Payer: Self-pay | Admitting: Family Medicine

## 2021-10-13 DIAGNOSIS — M810 Age-related osteoporosis without current pathological fracture: Secondary | ICD-10-CM

## 2021-10-23 DIAGNOSIS — R7303 Prediabetes: Secondary | ICD-10-CM | POA: Diagnosis not present

## 2021-10-23 DIAGNOSIS — M810 Age-related osteoporosis without current pathological fracture: Secondary | ICD-10-CM | POA: Diagnosis not present

## 2021-10-23 DIAGNOSIS — E7800 Pure hypercholesterolemia, unspecified: Secondary | ICD-10-CM | POA: Diagnosis not present

## 2021-10-23 DIAGNOSIS — K5909 Other constipation: Secondary | ICD-10-CM | POA: Diagnosis not present

## 2021-10-23 DIAGNOSIS — K219 Gastro-esophageal reflux disease without esophagitis: Secondary | ICD-10-CM | POA: Diagnosis not present

## 2021-10-23 DIAGNOSIS — M791 Myalgia, unspecified site: Secondary | ICD-10-CM | POA: Diagnosis not present

## 2021-10-23 DIAGNOSIS — J310 Chronic rhinitis: Secondary | ICD-10-CM | POA: Diagnosis not present

## 2021-10-23 DIAGNOSIS — F419 Anxiety disorder, unspecified: Secondary | ICD-10-CM | POA: Diagnosis not present

## 2021-10-23 DIAGNOSIS — R42 Dizziness and giddiness: Secondary | ICD-10-CM | POA: Diagnosis not present

## 2021-10-23 DIAGNOSIS — R0781 Pleurodynia: Secondary | ICD-10-CM | POA: Diagnosis not present

## 2021-11-29 DIAGNOSIS — J329 Chronic sinusitis, unspecified: Secondary | ICD-10-CM | POA: Diagnosis not present

## 2021-11-29 DIAGNOSIS — Z765 Malingerer [conscious simulation]: Secondary | ICD-10-CM | POA: Diagnosis not present

## 2021-11-29 DIAGNOSIS — F418 Other specified anxiety disorders: Secondary | ICD-10-CM | POA: Diagnosis not present

## 2021-12-05 DIAGNOSIS — H524 Presbyopia: Secondary | ICD-10-CM | POA: Diagnosis not present

## 2021-12-05 DIAGNOSIS — H00021 Hordeolum internum right upper eyelid: Secondary | ICD-10-CM | POA: Diagnosis not present

## 2021-12-05 DIAGNOSIS — Z961 Presence of intraocular lens: Secondary | ICD-10-CM | POA: Diagnosis not present

## 2021-12-05 DIAGNOSIS — H52203 Unspecified astigmatism, bilateral: Secondary | ICD-10-CM | POA: Diagnosis not present

## 2022-01-01 DIAGNOSIS — M810 Age-related osteoporosis without current pathological fracture: Secondary | ICD-10-CM | POA: Diagnosis not present

## 2022-01-01 DIAGNOSIS — Z6822 Body mass index (BMI) 22.0-22.9, adult: Secondary | ICD-10-CM | POA: Diagnosis not present

## 2022-01-01 DIAGNOSIS — M1991 Primary osteoarthritis, unspecified site: Secondary | ICD-10-CM | POA: Diagnosis not present

## 2022-01-01 DIAGNOSIS — M353 Polymyalgia rheumatica: Secondary | ICD-10-CM | POA: Diagnosis not present

## 2022-01-17 DIAGNOSIS — M810 Age-related osteoporosis without current pathological fracture: Secondary | ICD-10-CM | POA: Diagnosis not present

## 2022-02-21 ENCOUNTER — Other Ambulatory Visit: Payer: Self-pay | Admitting: Family Medicine

## 2022-02-21 DIAGNOSIS — Z1231 Encounter for screening mammogram for malignant neoplasm of breast: Secondary | ICD-10-CM

## 2022-03-07 ENCOUNTER — Other Ambulatory Visit: Payer: Self-pay | Admitting: Thoracic Surgery (Cardiothoracic Vascular Surgery)

## 2022-03-07 DIAGNOSIS — Z85118 Personal history of other malignant neoplasm of bronchus and lung: Secondary | ICD-10-CM

## 2022-03-19 ENCOUNTER — Encounter: Payer: Self-pay | Admitting: Thoracic Surgery (Cardiothoracic Vascular Surgery)

## 2022-04-17 ENCOUNTER — Ambulatory Visit: Payer: Medicare Other | Admitting: Thoracic Surgery (Cardiothoracic Vascular Surgery)

## 2022-04-19 ENCOUNTER — Ambulatory Visit
Admission: RE | Admit: 2022-04-19 | Discharge: 2022-04-19 | Disposition: A | Discharge status: Home / Self Care | Payer: Medicare Other | Source: Ambulatory Visit | Attending: Thoracic Surgery (Cardiothoracic Vascular Surgery) | Admitting: Thoracic Surgery (Cardiothoracic Vascular Surgery)

## 2022-04-19 DIAGNOSIS — J439 Emphysema, unspecified: Secondary | ICD-10-CM | POA: Diagnosis not present

## 2022-04-19 DIAGNOSIS — R911 Solitary pulmonary nodule: Secondary | ICD-10-CM | POA: Diagnosis not present

## 2022-04-19 DIAGNOSIS — Z85118 Personal history of other malignant neoplasm of bronchus and lung: Secondary | ICD-10-CM

## 2022-04-24 ENCOUNTER — Ambulatory Visit (INDEPENDENT_AMBULATORY_CARE_PROVIDER_SITE_OTHER): Payer: Medicare Other | Admitting: Thoracic Surgery (Cardiothoracic Vascular Surgery)

## 2022-04-24 ENCOUNTER — Encounter: Payer: Self-pay | Admitting: Thoracic Surgery (Cardiothoracic Vascular Surgery)

## 2022-04-24 VITALS — BP 139/56 | HR 64 | Resp 20 | Ht 67.0 in | Wt 148.0 lb

## 2022-04-24 DIAGNOSIS — Z85118 Personal history of other malignant neoplasm of bronchus and lung: Secondary | ICD-10-CM | POA: Diagnosis not present

## 2022-04-24 NOTE — Progress Notes (Signed)
[address]       [place] [address]             [phone]      HPI: [name] returns for an annual follow-up visit regarding her previous lung cancer.  [name] is a 82 year old former smoker with a history of COPD, stage Ia non-small cell carcinoma of the lung, hyperlipidemia, and coronary and aortic atherosclerosis.  She quit smoking in 2007.  She had a right lower lobe superior segmentectomy for stage Ia adenocarcinoma in October 2014.  I have followed her since then.  She feels well.  She continues to have anxiety about the coronary and thoracic aortic atherosclerosis noted on her CT.  Also the finding of emphysema.  She has not had any respiratory issues.  She denies chest pain, pressure, tightness, or exertional shortness of breath.  Appetite is good.  Weight is stable.  Past Medical History:  Diagnosis Date   Arthritis    GERD (gastroesophageal reflux disease)    H/O vitamin D deficiency    Hypercholesteremia    Hyperplastic polyps of stomach    Joint pain    Non-small cell cancer of right lung ([place]) 01/2013   Stage IA non small cell   Osteoporosis     Current Outpatient Medications  Medication Sig Dispense Refill   ALPRAZolam (XANAX) 0.25 MG tablet As needed for flying     atorvastatin (LIPITOR) 10 MG tablet Take 10 mg by mouth daily. Takes M-W-F     Cholecalciferol (VITAMIN D) 2000 UNITS tablet Take 1,000 Units by mouth daily.      denosumab (PROLIA) 60 MG/ML SOSY injection Inject 60 mg into the skin every 6 (six) months.     hydroxypropyl methylcellulose (ISOPTO TEARS) 2.5 % ophthalmic solution Place 1 drop into both eyes daily.     ibuprofen (ADVIL,MOTRIN) 200 MG tablet Take 200-400 mg by mouth every 8 (eight) hours as needed for pain or headache.     LINZESS 72 MCG capsule      meclizine (ANTIVERT) 25 MG tablet Take 25 mg by mouth 3 (three) times daily as needed for dizziness.      meloxicam (MOBIC) 15 MG tablet Take 15 mg by mouth  daily as needed for pain.      Multiple Vitamin (MULTIVITAMIN WITH MINERALS) TABS tablet Take 1 tablet by mouth See admin instructions. Takes daily on mondays, wednesdays, fridays, and saturdays     Omega-3 Krill Oil 500 MG CAPS Take 1 capsule by mouth daily.     omeprazole (PRILOSEC) 10 MG capsule as needed.     Omeprazole Magnesium (PRILOSEC OTC PO) Take 1 tablet by mouth daily as needed (reflux).     raloxifene (EVISTA) 60 MG tablet Take 60 mg by mouth See admin instructions. Takes 60mg  on mondays, wednesdays, fridays, saturdays (Patient not taking: Reported on 04/24/2022)     No current facility-administered medications for this visit.    Physical Exam BP (!) 139/56   Pulse 64   Resp 20   Ht 5\' 7"  (1.702 m)   Wt 148 lb (67.1 kg)   SpO2 94% Comment: RA  BMI 23.30 kg/m  82 year old woman in no acute distress Well-developed and well-nourished Alert and oriented x 3 with no focal deficits Lungs clear with diminished but equal breath sounds bilaterally, no wheezing Cardiac regular rate and rhythm No clubbing cyanosis or edema   Diagnostic Tests: CT CHEST WITHOUT CONTRAST   TECHNIQUE: Multidetector CT  imaging of the chest was performed following the standard protocol without IV contrast.   RADIATION DOSE REDUCTION: This exam was performed according to the departmental dose-optimization program which includes automated exposure control, adjustment of the mA and/or kV according to patient size and/or use of iterative reconstruction technique.   COMPARISON:  CT 03/31/2021 and 04/05/2020.   FINDINGS: Cardiovascular: Atherosclerosis of the aorta, great vessels and coronary arteries again noted. The heart size is normal. There is no pericardial effusion.   Mediastinum/Nodes: There are no enlarged mediastinal, hilar or axillary lymph nodes.Stable calcified subcarinal lymph nodes. Hilar assessment is limited by the lack of intravenous contrast, although the hilar contours appear  unchanged. The thyroid gland, trachea and esophagus demonstrate no significant findings.   Lungs/Pleura: No pleural effusion or pneumothorax. Moderate centrilobular and paraseptal emphysema. There are stable postsurgical changes in the right middle and lower lobes. Stable solid 4 mm left apical nodule on image 25/5. Stable part solid 4 mm left lower lobe nodule on image 70/5. No new or enlarging pulmonary nodules.   Upper abdomen: The visualized upper abdomen appears stable without significant findings.   Musculoskeletal/Chest wall: There is no chest wall mass or suspicious osseous finding.   IMPRESSION: 1. Stable chest CT without evidence of local recurrence or metastatic disease. 2. Stable postsurgical changes in the right middle and lower lobes. 3. Stable small left lung nodules, likely benign based on stability. 4. Aortic Atherosclerosis (ICD10-I70.0) and Emphysema (ICD10-J43.9).     Electronically Signed   By: Richardean Sale M.D.   On: 04/19/2022 14:55 I personally reviewed the CT images.  Postoperative changes are present.  Coronary and thoracic aortic atherosclerosis.  Has significant amount of calcification but no significant change compared to a year ago.  Moderate emphysema.  Impression: [name] is a 82 year old former smoker with a history of COPD, stage Ia non-small cell carcinoma of the lung, hyperlipidemia, and coronary and aortic atherosclerosis.  She quit smoking in 2007.  Stage Ia adenocarcinoma of the lung-status post superior segmentectomy in 2014.  She is now 9 years out from surgery with no evidence of recurrent disease.  Emphysema-no significant symptomatology.  CT findings stable.  Coronary and thoracic aortic atherosclerosis-stable.  Asymptomatic.  She is on statin and fish oil.  Also on aspirin and beta-blocker.  Plan: Return in 1 year with CT chest  Melinda Nakayama, MD [facility] [phone]

## 2022-04-27 DIAGNOSIS — R7309 Other abnormal glucose: Secondary | ICD-10-CM | POA: Diagnosis not present

## 2022-04-27 DIAGNOSIS — R7303 Prediabetes: Secondary | ICD-10-CM | POA: Diagnosis not present

## 2022-05-03 DIAGNOSIS — F419 Anxiety disorder, unspecified: Secondary | ICD-10-CM | POA: Diagnosis not present

## 2022-05-03 DIAGNOSIS — Z23 Encounter for immunization: Secondary | ICD-10-CM | POA: Diagnosis not present

## 2022-05-03 DIAGNOSIS — M810 Age-related osteoporosis without current pathological fracture: Secondary | ICD-10-CM | POA: Diagnosis not present

## 2022-05-03 DIAGNOSIS — K219 Gastro-esophageal reflux disease without esophagitis: Secondary | ICD-10-CM | POA: Diagnosis not present

## 2022-05-03 DIAGNOSIS — R400 Somnolence: Secondary | ICD-10-CM | POA: Diagnosis not present

## 2022-05-03 DIAGNOSIS — M791 Myalgia, unspecified site: Secondary | ICD-10-CM | POA: Diagnosis not present

## 2022-05-03 DIAGNOSIS — R7303 Prediabetes: Secondary | ICD-10-CM | POA: Diagnosis not present

## 2022-05-03 DIAGNOSIS — K5909 Other constipation: Secondary | ICD-10-CM | POA: Diagnosis not present

## 2022-05-03 DIAGNOSIS — J310 Chronic rhinitis: Secondary | ICD-10-CM | POA: Diagnosis not present

## 2022-05-03 DIAGNOSIS — Z6823 Body mass index (BMI) 23.0-23.9, adult: Secondary | ICD-10-CM | POA: Diagnosis not present

## 2022-05-03 DIAGNOSIS — E7800 Pure hypercholesterolemia, unspecified: Secondary | ICD-10-CM | POA: Diagnosis not present

## 2022-05-03 DIAGNOSIS — R42 Dizziness and giddiness: Secondary | ICD-10-CM | POA: Diagnosis not present

## 2022-06-12 ENCOUNTER — Ambulatory Visit
Admission: RE | Admit: 2022-06-12 | Discharge: 2022-06-12 | Disposition: A | Discharge status: Home / Self Care | Payer: Medicare Other | Source: Ambulatory Visit | Attending: Family Medicine | Admitting: Family Medicine

## 2022-06-12 DIAGNOSIS — M810 Age-related osteoporosis without current pathological fracture: Secondary | ICD-10-CM

## 2022-06-12 DIAGNOSIS — Z1231 Encounter for screening mammogram for malignant neoplasm of breast: Secondary | ICD-10-CM

## 2022-06-12 DIAGNOSIS — Z780 Asymptomatic menopausal state: Secondary | ICD-10-CM | POA: Diagnosis not present

## 2022-07-04 DIAGNOSIS — D225 Melanocytic nevi of trunk: Secondary | ICD-10-CM | POA: Diagnosis not present

## 2022-07-04 DIAGNOSIS — L821 Other seborrheic keratosis: Secondary | ICD-10-CM | POA: Diagnosis not present

## 2022-07-04 DIAGNOSIS — L820 Inflamed seborrheic keratosis: Secondary | ICD-10-CM | POA: Diagnosis not present

## 2022-07-04 DIAGNOSIS — L578 Other skin changes due to chronic exposure to nonionizing radiation: Secondary | ICD-10-CM | POA: Diagnosis not present

## 2022-07-04 DIAGNOSIS — L814 Other melanin hyperpigmentation: Secondary | ICD-10-CM | POA: Diagnosis not present

## 2022-07-11 DIAGNOSIS — E7800 Pure hypercholesterolemia, unspecified: Secondary | ICD-10-CM | POA: Diagnosis not present

## 2022-07-11 DIAGNOSIS — K219 Gastro-esophageal reflux disease without esophagitis: Secondary | ICD-10-CM | POA: Diagnosis not present

## 2022-07-11 DIAGNOSIS — F339 Major depressive disorder, recurrent, unspecified: Secondary | ICD-10-CM | POA: Diagnosis not present

## 2022-07-11 DIAGNOSIS — M810 Age-related osteoporosis without current pathological fracture: Secondary | ICD-10-CM | POA: Diagnosis not present

## 2022-07-27 DIAGNOSIS — M810 Age-related osteoporosis without current pathological fracture: Secondary | ICD-10-CM | POA: Diagnosis not present

## 2022-09-06 DIAGNOSIS — Z85118 Personal history of other malignant neoplasm of bronchus and lung: Secondary | ICD-10-CM | POA: Diagnosis not present

## 2022-09-06 DIAGNOSIS — Z853 Personal history of malignant neoplasm of breast: Secondary | ICD-10-CM | POA: Diagnosis not present

## 2022-09-06 DIAGNOSIS — Z780 Asymptomatic menopausal state: Secondary | ICD-10-CM | POA: Diagnosis not present

## 2022-09-06 DIAGNOSIS — Z01419 Encounter for gynecological examination (general) (routine) without abnormal findings: Secondary | ICD-10-CM | POA: Diagnosis not present

## 2022-10-16 DIAGNOSIS — Z6824 Body mass index (BMI) 24.0-24.9, adult: Secondary | ICD-10-CM | POA: Diagnosis not present

## 2022-10-16 DIAGNOSIS — Z0000 Encounter for general adult medical examination without abnormal findings: Secondary | ICD-10-CM | POA: Diagnosis not present

## 2022-10-16 DIAGNOSIS — E7800 Pure hypercholesterolemia, unspecified: Secondary | ICD-10-CM | POA: Diagnosis not present

## 2022-10-16 DIAGNOSIS — Z1389 Encounter for screening for other disorder: Secondary | ICD-10-CM | POA: Diagnosis not present

## 2022-10-16 DIAGNOSIS — M810 Age-related osteoporosis without current pathological fracture: Secondary | ICD-10-CM | POA: Diagnosis not present

## 2022-10-16 DIAGNOSIS — R7309 Other abnormal glucose: Secondary | ICD-10-CM | POA: Diagnosis not present

## 2022-10-16 DIAGNOSIS — R7303 Prediabetes: Secondary | ICD-10-CM | POA: Diagnosis not present

## 2022-10-24 DIAGNOSIS — R7303 Prediabetes: Secondary | ICD-10-CM | POA: Diagnosis not present

## 2022-10-24 DIAGNOSIS — Z6823 Body mass index (BMI) 23.0-23.9, adult: Secondary | ICD-10-CM | POA: Diagnosis not present

## 2022-10-24 DIAGNOSIS — I700 Atherosclerosis of aorta: Secondary | ICD-10-CM | POA: Diagnosis not present

## 2022-10-24 DIAGNOSIS — F419 Anxiety disorder, unspecified: Secondary | ICD-10-CM | POA: Diagnosis not present

## 2022-10-24 DIAGNOSIS — K5909 Other constipation: Secondary | ICD-10-CM | POA: Diagnosis not present

## 2022-10-24 DIAGNOSIS — E7800 Pure hypercholesterolemia, unspecified: Secondary | ICD-10-CM | POA: Diagnosis not present

## 2022-10-24 DIAGNOSIS — J439 Emphysema, unspecified: Secondary | ICD-10-CM | POA: Diagnosis not present

## 2022-10-24 DIAGNOSIS — R42 Dizziness and giddiness: Secondary | ICD-10-CM | POA: Diagnosis not present

## 2022-10-24 DIAGNOSIS — F3341 Major depressive disorder, recurrent, in partial remission: Secondary | ICD-10-CM | POA: Diagnosis not present

## 2022-10-24 DIAGNOSIS — K219 Gastro-esophageal reflux disease without esophagitis: Secondary | ICD-10-CM | POA: Diagnosis not present

## 2022-10-24 DIAGNOSIS — M810 Age-related osteoporosis without current pathological fracture: Secondary | ICD-10-CM | POA: Diagnosis not present

## 2022-12-07 DIAGNOSIS — H04123 Dry eye syndrome of bilateral lacrimal glands: Secondary | ICD-10-CM | POA: Diagnosis not present

## 2022-12-07 DIAGNOSIS — H524 Presbyopia: Secondary | ICD-10-CM | POA: Diagnosis not present

## 2022-12-07 DIAGNOSIS — H26491 Other secondary cataract, right eye: Secondary | ICD-10-CM | POA: Diagnosis not present

## 2022-12-20 DIAGNOSIS — Z23 Encounter for immunization: Secondary | ICD-10-CM | POA: Diagnosis not present

## 2023-01-02 DIAGNOSIS — M353 Polymyalgia rheumatica: Secondary | ICD-10-CM | POA: Diagnosis not present

## 2023-01-02 DIAGNOSIS — M545 Low back pain, unspecified: Secondary | ICD-10-CM | POA: Diagnosis not present

## 2023-01-02 DIAGNOSIS — M7918 Myalgia, other site: Secondary | ICD-10-CM | POA: Diagnosis not present

## 2023-01-02 DIAGNOSIS — Z6823 Body mass index (BMI) 23.0-23.9, adult: Secondary | ICD-10-CM | POA: Diagnosis not present

## 2023-01-02 DIAGNOSIS — M810 Age-related osteoporosis without current pathological fracture: Secondary | ICD-10-CM | POA: Diagnosis not present

## 2023-01-02 DIAGNOSIS — M1991 Primary osteoarthritis, unspecified site: Secondary | ICD-10-CM | POA: Diagnosis not present

## 2023-01-28 DIAGNOSIS — M810 Age-related osteoporosis without current pathological fracture: Secondary | ICD-10-CM | POA: Diagnosis not present

## 2023-02-08 ENCOUNTER — Emergency Department (HOSPITAL_BASED_OUTPATIENT_CLINIC_OR_DEPARTMENT_OTHER): Payer: Medicare Other | Admitting: Radiology

## 2023-02-08 ENCOUNTER — Emergency Department (HOSPITAL_BASED_OUTPATIENT_CLINIC_OR_DEPARTMENT_OTHER): Payer: Medicare Other

## 2023-02-08 ENCOUNTER — Other Ambulatory Visit: Payer: Self-pay

## 2023-02-08 ENCOUNTER — Encounter (HOSPITAL_BASED_OUTPATIENT_CLINIC_OR_DEPARTMENT_OTHER): Payer: Self-pay

## 2023-02-08 ENCOUNTER — Emergency Department (HOSPITAL_BASED_OUTPATIENT_CLINIC_OR_DEPARTMENT_OTHER)
Admission: EM | Admit: 2023-02-08 | Discharge: 2023-02-08 | Disposition: A | Discharge status: Home / Self Care | Payer: Medicare Other | Attending: Emergency Medicine | Admitting: Emergency Medicine

## 2023-02-08 DIAGNOSIS — Z87891 Personal history of nicotine dependence: Secondary | ICD-10-CM | POA: Insufficient documentation

## 2023-02-08 DIAGNOSIS — W19XXXA Unspecified fall, initial encounter: Secondary | ICD-10-CM | POA: Diagnosis not present

## 2023-02-08 DIAGNOSIS — S2241XA Multiple fractures of ribs, right side, initial encounter for closed fracture: Secondary | ICD-10-CM

## 2023-02-08 DIAGNOSIS — Z85118 Personal history of other malignant neoplasm of bronchus and lung: Secondary | ICD-10-CM | POA: Insufficient documentation

## 2023-02-08 DIAGNOSIS — S299XXA Unspecified injury of thorax, initial encounter: Secondary | ICD-10-CM | POA: Diagnosis present

## 2023-02-08 DIAGNOSIS — I2510 Atherosclerotic heart disease of native coronary artery without angina pectoris: Secondary | ICD-10-CM | POA: Insufficient documentation

## 2023-02-08 DIAGNOSIS — I700 Atherosclerosis of aorta: Secondary | ICD-10-CM | POA: Diagnosis not present

## 2023-02-08 DIAGNOSIS — R0781 Pleurodynia: Secondary | ICD-10-CM | POA: Diagnosis not present

## 2023-02-08 DIAGNOSIS — S2231XA Fracture of one rib, right side, initial encounter for closed fracture: Secondary | ICD-10-CM | POA: Diagnosis not present

## 2023-02-08 LAB — BASIC METABOLIC PANEL
Anion gap: 7 (ref 5–15)
BUN: 17 mg/dL (ref 8–23)
CO2: 30 mmol/L (ref 22–32)
Calcium: 9.7 mg/dL (ref 8.9–10.3)
Chloride: 102 mmol/L (ref 98–111)
Creatinine, Ser: 0.85 mg/dL (ref 0.44–1.00)
GFR, Estimated: >60 mL/min (ref >60)
Glucose, Bld: 153 mg/dL — ABNORMAL HIGH (ref 70–99)
Potassium: 4.4 mmol/L (ref 3.5–5.1)
Sodium: 139 mmol/L (ref 135–145)

## 2023-02-08 MED ORDER — LIDOCAINE 5 % EX PTCH: 1.0000 | MEDICATED_PATCH | CUTANEOUS | 0 refills | Status: DC

## 2023-02-08 MED ORDER — IOHEXOL 300 MG/ML  SOLN
80.0000 mL | Freq: Once | INTRAMUSCULAR | Status: AC | PRN
  Administered 2023-02-08: 80 mL via INTRAVENOUS

## 2023-02-08 MED ORDER — HYDROCODONE-ACETAMINOPHEN 5-325 MG PO TABS
1.0000 | ORAL_TABLET | Freq: Once | ORAL | Status: AC
  Administered 2023-02-08: 1 via ORAL
  Filled 2023-02-08: qty 1

## 2023-02-08 MED ORDER — OXYCODONE HCL 5 MG PO TABS
2.5000 mg | ORAL_TABLET | ORAL | 0 refills | Status: DC | PRN
Start: 2023-02-08 — End: 2023-08-07

## 2023-02-08 NOTE — ED Notes (Signed)
RT Note: Patient was educated on the use of an incentive spirometer. Patient tolerated well

## 2023-02-08 NOTE — ED Provider Notes (Signed)
[place] EMERGENCY DEPARTMENT AT [facility] Provider Note   CSN: [id number] Arrival date & time: 02/08/23  1152     History  Chief Complaint  Patient presents with   Fall   Rib Injury    [name] is a 82 y.o. female.   Fall   82 year old female presents emergency department with complaints of fall/rib pain.  Patient states that she was in the [employer] when she attempted to lift a heavy object into her buggy.  States that the object was "too big for me" and she lost her balance falling backwards.  States that she hit a pallet with the back right side of her rib cage causing pain.  Denies trauma to head, loss of consciousness.  Reports pain with taking a deep breath.  Denies any abdominal pain, flank pain, nausea, vomiting, upper or lower extremity pain.  Denies any anticoagulation use.  Past medical history significant for non-small cell lung cancer, GERD, hypercholesterolemia, osteoporosis, chronic nasal congestion, CAD  Home Medications Prior to Admission medications   Medication Sig Start Date End Date Taking? Authorizing Provider  lidocaine (LIDODERM) 5 % Place 1 patch onto the skin daily. Remove & Discard patch within 12 hours or as directed by MD 02/08/23  Yes Sherian Maroon A, PA  oxyCODONE (ROXICODONE) 5 MG immediate release tablet Take 0.5 tablets (2.5 mg total) by mouth every 4 (four) hours as needed for severe pain (pain score 7-10). 02/08/23  Yes Peter Garter, PA  ALPRAZolam Prudy Feeler) 0.25 MG tablet As needed for flying 09/04/16   [provider]  atorvastatin (LIPITOR) 10 MG tablet Take 10 mg by mouth daily. Takes M-W-F    [provider]  Cholecalciferol (VITAMIN D) 2000 UNITS tablet Take 1,000 Units by mouth daily.     [provider]  denosumab (PROLIA) 60 MG/ML SOSY injection Inject 60 mg into the skin every 6 (six) months.    [provider]  hydroxypropyl methylcellulose (ISOPTO TEARS) 2.5 % ophthalmic  solution Place 1 drop into both eyes daily.    [provider]  ibuprofen (ADVIL,MOTRIN) 200 MG tablet Take 200-400 mg by mouth every 8 (eight) hours as needed for pain or headache.    [provider]  Karlene Einstein 72 MCG capsule  02/07/17   [provider]  meclizine (ANTIVERT) 25 MG tablet Take 25 mg by mouth 3 (three) times daily as needed for dizziness.  12/24/12   [provider]  meloxicam (MOBIC) 15 MG tablet Take 15 mg by mouth daily as needed for pain.  12/22/12   [provider]  Multiple Vitamin (MULTIVITAMIN WITH MINERALS) TABS tablet Take 1 tablet by mouth See admin instructions. Takes daily on mondays, wednesdays, fridays, and saturdays    [provider]  Omega-3 Krill Oil 500 MG CAPS Take 1 capsule by mouth daily.    [provider]  omeprazole (PRILOSEC) 10 MG capsule as needed.    [provider]  Omeprazole Magnesium (PRILOSEC OTC PO) Take 1 tablet by mouth daily as needed (reflux).    [provider]  raloxifene (EVISTA) 60 MG tablet Take 60 mg by mouth See admin instructions. Takes 60mg  on mondays, wednesdays, fridays, saturdays Patient not taking: Reported on 04/24/2022 11/07/12   [provider]      Allergies    Bupropion, Doxycycline, Duloxetine hcl, Erythromycin, Escitalopram, and Sertraline    Review of Systems   Review of Systems  All other systems reviewed and are negative.  Physical Exam Updated Vital Signs BP (!) 166/63   Pulse 71   Temp 97.9 F (36.6 C) (Oral)   Resp 16   SpO2 95%  Physical Exam Vitals and nursing note reviewed.  Constitutional:      General: She is not in acute distress.    Appearance: She is well-developed.  HENT:     Head: Normocephalic and atraumatic.  Eyes:     Conjunctiva/sclera: Conjunctivae normal.  Cardiovascular:     Rate and Rhythm: Normal rate and regular rhythm.  Pulmonary:     Effort: Pulmonary effort is normal. No respiratory  distress.     Breath sounds: Normal breath sounds.     Comments: Patient with posterior lateral right-sided rib tenderness.  No obvious palpable crepitus/deformity.  No midline tenderness of cervical, thoracic, lumbar spine without step-off or deformity.  Patient reports pain with deep inspiration. Abdominal:     Palpations: Abdomen is soft.     Tenderness: There is no abdominal tenderness.  Musculoskeletal:        General: No swelling.     Cervical back: Neck supple.     Comments: Patient will move all 4 extremities without difficulty.  No sensory deficits along major nerve issue regions of upper or lower extremities.  Radial pedal pulses 2+ bilaterally.  Symmetric strength bilateral upper and lower extremities.  Skin:    General: Skin is warm and dry.     Capillary Refill: Capillary refill takes less than 2 seconds.  Neurological:     Mental Status: She is alert.  Psychiatric:        Mood and Affect: Mood normal.     ED Results / Procedures / Treatments   Labs (all labs ordered are listed, but only abnormal results are displayed) Labs Reviewed  BASIC METABOLIC PANEL - Abnormal; Notable for the following components:      Result Value   Glucose, Bld 153 (*)    All other components within normal limits    EKG None  Radiology CT Chest W Contrast  Result Date: 02/08/2023 CLINICAL DATA:  82 year old female with history of trauma from a fall complaining of right-sided rib pain. Multiple rib fractures noted on chest x-ray. EXAM: CT CHEST WITH CONTRAST TECHNIQUE: Multidetector CT imaging of the chest was performed during intravenous contrast administration. RADIATION DOSE REDUCTION: This exam was performed according to the departmental dose-optimization program which includes automated exposure control, adjustment of the mA and/or kV according to patient size and/or use of iterative reconstruction technique. CONTRAST:  80mL OMNIPAQUE IOHEXOL 300 MG/ML  SOLN COMPARISON:  Chest CT  04/19/2022.  Chest x-ray 02/08/2023. FINDINGS: Cardiovascular: Heart size is normal. There is no significant pericardial fluid, thickening or pericardial calcification. There is aortic atherosclerosis, as well as atherosclerosis of the great vessels of the mediastinum and the coronary arteries, including calcified atherosclerotic plaque in the left main, left anterior descending, left circumflex and right coronary arteries. Mediastinum/Nodes: No pathologically enlarged mediastinal or hilar lymph nodes. Esophagus is unremarkable in appearance. No axillary lymphadenopathy. Lungs/Pleura: No pneumothorax. Areas of chronic pleuroparenchymal scarring and architectural distortion again noted in the periphery of the right lung, similar to the prior study. No acute consolidative airspace disease. No hemothorax or pleural effusion. No definite suspicious appearing pulmonary nodules or masses are noted. Diffuse bronchial wall thickening with moderate centrilobular and paraseptal emphysema. Upper Abdomen: Aortic atherosclerosis. Musculoskeletal: Mildly comminuted and angulated fracture of the lateral right fourth rib. Minimally displaced fracture of the lateral right fifth rib. Nondisplaced fracture  of the lateral right seventh rib. Chronic appearing compression fractures of T5 and T8 with up to 20% loss of central vertebral body height at both levels, unchanged compared to the prior study. There are no aggressive appearing lytic or blastic lesions noted in the visualized portions of the skeleton. IMPRESSION: 1. Acute fractures of the right lateral fourth, fifth and seventh ribs, as above. 2. No pneumothorax or other signs of significant acute interest thoracic trauma. 3. Aortic atherosclerosis, in addition to left main and three-vessel coronary artery disease. 4. Diffuse bronchial wall thickening with moderate centrilobular and paraseptal emphysema; imaging findings suggestive of underlying COPD. Aortic Atherosclerosis  (ICD10-I70.0) and Emphysema (ICD10-J43.9). Electronically Signed   By: Trudie Reed M.D.   On: 02/08/2023 16:14   DG Ribs Unilateral W/Chest Right  Result Date: 02/08/2023 CLINICAL DATA:  Right posterior rib pain after falling EXAM: RIGHT RIBS AND CHEST - 3+ VIEW COMPARISON:  05/19/2013 chest radiograph FINDINGS: Displaced fracture of the right lateral fourth rib. Possible minimally displaced fractures of the lateral right eighth and ninth ribs, where there is a small contour deformity. No pneumothorax or pleural effusion. No focal pulmonary opacity. Postsurgical changes in the lower right lung. Normal cardiac and mediastinal contours. IMPRESSION: Displaced fracture of the right lateral fourth rib and possible minimally displaced fractures of the lateral right eighth and ninth ribs. Electronically Signed   By: Wiliam Ke M.D.   On: 02/08/2023 14:01    Procedures Procedures    Medications Ordered in ED Medications  HYDROcodone-acetaminophen (NORCO/VICODIN) 5-325 MG per tablet 1 tablet (1 tablet Oral Given 02/08/23 1414)  iohexol (OMNIPAQUE) 300 MG/ML solution 80 mL (80 mLs Intravenous Contrast Given 02/08/23 1525)    ED Course/ Medical Decision Making/ A&P Clinical Course as of 02/08/23 1702  Fri Feb 08, 2023  1645 DG Ribs Unilateral W/Chest Right [CR]    Clinical Course User Index [CR] Peter Garter, PA                                 Medical Decision Making Amount and/or Complexity of Data Reviewed Radiology: ordered.   This patient presents to the ED for concern of fall, this involves an extensive number of treatment options, and is a complaint that carries with it a high risk of complications and morbidity.  The differential diagnosis includes fracture, pneumothorax, pulmonary contusion, spinal cord injury, dislocation, flail chest, other   Co morbidities that complicate the patient evaluation  See HPI   Additional history obtained:  Additional history obtained  from EMR External records from outside source obtained and reviewed including hospital records   Lab Tests:  I Ordered, and personally interpreted labs.  The pertinent results include: No electrolyte abnormalities.  No renal dysfunction.   Imaging Studies ordered:  I ordered imaging studies including chest x-ray, CT chest I independently visualized and interpreted imaging which showed  Chest x-ray: Displaced fracture of right lateral fourth rib and possible minimally displaced fractures of lateral right eighth and ninth ribs. CT chest: Acute fractures of right lateral fourth fifth and seventh ribs.  No pneumothorax.  Aortic atherosclerosis.  Diffuse bronchial wall thickening with moderate centrilobular and paraseptal emphysema.  Aortic I agree with the radiologist interpretation  Cardiac Monitoring: / EKG:  The patient was maintained on a cardiac monitor.  I personally viewed and interpreted the cardiac monitored which showed an underlying rhythm of: Sinus rhythm   Consultations Obtained:  Consulted  attending physician Dr. Theresia Lo regarding the case given patient's age and evidence of multiple rib fractures.  Who agreed that if patient is not hypoxic, pain controlled with medications and patient desires to go home, patient may be discharged with close follow-up in the outpatient setting.  Problem List / ED Course / Critical interventions / Medication management  Fall, rib pain I ordered medication including hydrocodone   Reevaluation of the patient after these medicines showed that the patient improved I have reviewed the patients home medicines and have made adjustments as needed   Social Determinants of Health:  Former cigarette use.  Denies illicit drug use.   Test / Admission - Considered:  Fall, rib pain Vitals signs significant for hypertension blood pressure 156/50. Otherwise within normal range and stable throughout visit. Laboratory/imaging studies significant  for: See above 82 year old female presents emergency department after mechanical fall where she lost her balance while lifting up a heavy object and landed with her right thorax on a pallet.  No trauma to head or loss of consciousness with nonfocal neurologic exam.  Chest x-ray and subsequently CT chest did show evidence of multiple rib fractures.  On exam, patient without obvious flail chest, hypoxia.  Patient's pain controlled with oral pain medications while in the ED.  After evidence of multiple rib fractures, offered patient CT imaging of head despite negative trauma to head but she declined x 2.  Offered patient admission given multiple rib fractures but patient states that she is feeling better after pain medication would like to be discharged home and tried to manage symptoms at home.  Will send patient home with medication take as needed for pain, incentive spirometry as well as stroke return precautions.  Will recommend follow-up with primary care at the beginning of the week for reassessment of symptoms.  Treatment plan discussed at length with patient and she acknowledged understanding was agreeable to said plan.  Patient overall well-appearing, afebrile in no acute distress. Worrisome signs and symptoms were discussed with the patient, and the patient acknowledged understanding to return to the ED if noticed. Patient was stable upon discharge.          Final Clinical Impression(s) / ED Diagnoses Final diagnoses:  Fall, initial encounter  Closed fracture of multiple ribs of right side, initial encounter    Rx / DC Orders ED Discharge Orders          Ordered    oxyCODONE (ROXICODONE) 5 MG immediate release tablet  Every 4 hours PRN        02/08/23 1651    lidocaine (LIDODERM) 5 %  Every 24 hours        02/08/23 1651              Peter Garter, [place] 02/08/23 1702    Elayne Snare K, DO 02/08/23 2340

## 2023-02-08 NOTE — ED Notes (Signed)
Pt ambulated about 50 ft. Maintained a steady gait.

## 2023-02-08 NOTE — Discharge Instructions (Signed)
As discussed, you have evidence of a few different rib fractures on your right side which are causing your symptoms.  Recommend use of Tylenol at home for baseline pain with pain medication for breakthrough pain.  Will also send in lidocaine numbing patches to place over area.  Continue use incentive spirometry at home to decrease likelihood of development of pneumonia.  Recommend follow-up with primary care for reevaluation near the beginning of next week.  Please not hesitate to return to emergency department for worrisome signs and symptoms we discussed become apparent.

## 2023-02-08 NOTE — ED Triage Notes (Signed)
Pt c/o mechanical fall approx 11a, states she hit R rib cage on pallets. States she heard "crack," concern for fx ribs. States R side hurts when she takes deep breath

## 2023-03-25 ENCOUNTER — Other Ambulatory Visit: Payer: Self-pay | Admitting: Thoracic Surgery (Cardiothoracic Vascular Surgery)

## 2023-03-25 DIAGNOSIS — Z85118 Personal history of other malignant neoplasm of bronchus and lung: Secondary | ICD-10-CM

## 2023-04-23 ENCOUNTER — Ambulatory Visit
Admission: RE | Admit: 2023-04-23 | Discharge: 2023-04-23 | Disposition: A | Discharge status: Home / Self Care | Payer: Medicare Other | Source: Ambulatory Visit | Attending: Thoracic Surgery (Cardiothoracic Vascular Surgery) | Admitting: Thoracic Surgery (Cardiothoracic Vascular Surgery)

## 2023-04-23 DIAGNOSIS — J439 Emphysema, unspecified: Secondary | ICD-10-CM | POA: Diagnosis not present

## 2023-04-23 DIAGNOSIS — Z85118 Personal history of other malignant neoplasm of bronchus and lung: Secondary | ICD-10-CM

## 2023-04-23 DIAGNOSIS — I2510 Atherosclerotic heart disease of native coronary artery without angina pectoris: Secondary | ICD-10-CM | POA: Diagnosis not present

## 2023-04-30 ENCOUNTER — Ambulatory Visit (INDEPENDENT_AMBULATORY_CARE_PROVIDER_SITE_OTHER): Payer: Medicare Other | Admitting: Thoracic Surgery (Cardiothoracic Vascular Surgery)

## 2023-04-30 ENCOUNTER — Encounter: Payer: Self-pay | Admitting: Thoracic Surgery (Cardiothoracic Vascular Surgery)

## 2023-04-30 VITALS — BP 150/76 | HR 67 | Resp 18 | Ht 67.0 in | Wt 151.0 lb

## 2023-04-30 DIAGNOSIS — Z85118 Personal history of other malignant neoplasm of bronchus and lung: Secondary | ICD-10-CM | POA: Diagnosis not present

## 2023-04-30 NOTE — Progress Notes (Signed)
 [address]       [place] [address]             [phone]     HPI: [name] returns for follow-up regarding previous lung cancer.  [name] is an 83 year old woman with a history of lung cancer, COPD, remote tobacco abuse, hyperlipidemia, and aortic and coronary atherosclerosis.  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  She has been followed since then.  I last saw her in 2024.  She was doing well at that time with no evidence of recurrent disease.  In the interim since that visit she fell in October and suffered multiple right-sided rib fractures.  Still has pain related to that.  Occasionally feels short of breath when she wakes up.  She is concerned that the radiologist noted severe emphysema on her recent CT.  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   Osteoporosis     Current Outpatient Medications  Medication Sig Dispense Refill   ALPRAZolam  (XANAX ) 0.25 MG tablet As needed for flying     atorvastatin  (LIPITOR) 10 MG tablet Take 10 mg by mouth daily. Takes M-W-F     Cholecalciferol  (VITAMIN D ) 2000 UNITS tablet Take 1,000 Units by mouth daily.      denosumab  (PROLIA ) 60 MG/ML SOSY injection Inject 60 mg into the skin every 6 (six) months.     hydroxypropyl methylcellulose (ISOPTO TEARS) 2.5 % ophthalmic solution Place 1 drop into both eyes daily.     ibuprofen (ADVIL,MOTRIN) 200 MG tablet Take 200-400 mg by mouth every 8 (eight) hours as needed for pain or headache.     lidocaine  (LIDODERM ) 5 % Place 1 patch onto the skin daily. Remove & Discard patch within 12 hours or as directed by MD 30 patch 0   LINZESS 72 MCG capsule       meclizine  (ANTIVERT ) 25 MG tablet Take 25 mg by mouth 3 (three) times daily as needed for dizziness.      meloxicam  (MOBIC ) 15 MG tablet Take 15 mg by mouth daily as needed for pain.      Multiple Vitamin (MULTIVITAMIN WITH MINERALS) TABS tablet Take 1 tablet by mouth See admin instructions. Takes daily on mondays, wednesdays, fridays, and saturdays     Omega-3 Krill Oil 500 MG CAPS Take 1 capsule by mouth daily.     omeprazole (PRILOSEC) 10 MG capsule as needed.     Omeprazole Magnesium (PRILOSEC OTC PO) Take 1 tablet by mouth daily as needed (reflux).     oxyCODONE  (ROXICODONE ) 5 MG immediate release tablet Take 0.5 tablets (2.5 mg total) by mouth every 4 (four) hours as needed for severe pain (pain score 7-10). 10 tablet 0   No current facility-administered medications for this visit.    Physical Exam BP (!) 150/76   Pulse 67   Resp 18   Ht 5' 7 (1.702 m)   Wt 151 lb (68.5 kg)   SpO2 96% Comment: RA  BMI 23.21 kg/m  83 year old woman in no acute distress Alert and oriented x 3 with no focal deficits Lungs clear  with equal breath sounds bilaterally Some tenderness to touch on the right chest Cardiac regular rate and rhythm  Diagnostic Tests: CT CHEST WITHOUT CONTRAST   TECHNIQUE: Multidetector CT imaging of the chest was performed following the standard protocol without IV contrast.   RADIATION DOSE REDUCTION: This exam was performed according to the departmental dose-optimization program which includes automated exposure control, adjustment of the mA and/or kV according to patient size and/or use of iterative reconstruction technique.   COMPARISON:  02/08/2023, 04/19/2022   FINDINGS: Cardiovascular: Aortic atherosclerosis. Normal heart size. Three-vessel coronary artery calcifications no pericardial effusion.   Mediastinum/Nodes: No enlarged mediastinal, hilar, or axillary lymph nodes. Thyroid  gland, trachea, and esophagus demonstrate no significant findings.    Lungs/Pleura: Severe emphysema. Unchanged postoperative appearance of the chest status post right middle and right lower lobe wedge resection. Unchanged tiny nodules, for example in the anterior left lower lobe measuring 0.4 cm (series 3, image 64). No pleural effusion or pneumothorax.   Upper Abdomen: No acute abnormality.   Musculoskeletal: No chest wall abnormality. Numerous subacute fractures of the lateral and posterior right ribs, several of which were seen acutely on prior examination dated 02/08/2023, others new in the interval although as above subacute appearing.   IMPRESSION: 1. Unchanged postoperative appearance of the chest status post right middle and right lower lobe wedge resection. No evidence of recurrent or metastatic disease in the chest. 2. Unchanged tiny pulmonary nodules.  Attention on follow-up. 3. Numerous subacute fractures of the lateral and posterior right ribs, several of which were seen acutely on prior examination dated 02/08/2023, others new in the interval although as above subacute appearing. 4. Severe emphysema. 5. Coronary artery disease.   Aortic Atherosclerosis (ICD10-I70.0) and Emphysema (ICD10-J43.9).     Electronically Signed   By: Marolyn JONETTA Jaksch M.D.   On: 04/23/2023 11:44  I personally reviewed the CT images.  There are multiple right-sided rib fractures.  She has emphysema (unchanged from last year's scan), coronary and aortic atherosclerosis, tiny lung nodules, no evidence of recurrent disease.  Impression: [name] is an 83 year old woman with a history of lung cancer, COPD, remote tobacco abuse, hyperlipidemia, and aortic and coronary atherosclerosis.  She quit smoking in 2007  History of stage Ia non-small cell carcinoma of the lung-status post superior segmentectomy.  Now 11 years out with no evidence of recurrent disease.  Will continue to scan her annually.  Emphysema-no wheezing and not having much in the way of shortness  of breath.  I reviewed her CT.  I do not see any difference from the scan a year ago when it was called moderate emphysema.  She has an appointment with Dr. Aisha next week.  She can discuss whether she needs any bronchodilators.  Aortic and coronary atherosclerosis-she is on a statin.  She is not having any chest pain.  Not an unusual finding smoker in her age group.  Offered to refer to cardiology but she really does not want to do that.  Rib fractures-multiple rib fractures in various states of healing.  Minimally displaced for the most part.  Still has some pain related to that.  That is not unusual.  Plan: Return in 1 year with CT chest  I spent over 20 minutes in review of records, images, and in consultation with [name] today Melinda JAYSON Millers, MD [facility] [phone]

## 2023-05-02 DIAGNOSIS — Z6823 Body mass index (BMI) 23.0-23.9, adult: Secondary | ICD-10-CM | POA: Diagnosis not present

## 2023-05-02 DIAGNOSIS — R7303 Prediabetes: Secondary | ICD-10-CM | POA: Diagnosis not present

## 2023-05-02 DIAGNOSIS — E7800 Pure hypercholesterolemia, unspecified: Secondary | ICD-10-CM | POA: Diagnosis not present

## 2023-05-02 DIAGNOSIS — M810 Age-related osteoporosis without current pathological fracture: Secondary | ICD-10-CM | POA: Diagnosis not present

## 2023-05-02 DIAGNOSIS — F331 Major depressive disorder, recurrent, moderate: Secondary | ICD-10-CM | POA: Diagnosis not present

## 2023-05-02 DIAGNOSIS — R0989 Other specified symptoms and signs involving the circulatory and respiratory systems: Secondary | ICD-10-CM | POA: Diagnosis not present

## 2023-05-02 DIAGNOSIS — K5909 Other constipation: Secondary | ICD-10-CM | POA: Diagnosis not present

## 2023-05-02 DIAGNOSIS — F419 Anxiety disorder, unspecified: Secondary | ICD-10-CM | POA: Diagnosis not present

## 2023-05-02 DIAGNOSIS — J439 Emphysema, unspecified: Secondary | ICD-10-CM | POA: Diagnosis not present

## 2023-05-02 DIAGNOSIS — I700 Atherosclerosis of aorta: Secondary | ICD-10-CM | POA: Diagnosis not present

## 2023-05-02 DIAGNOSIS — I2510 Atherosclerotic heart disease of native coronary artery without angina pectoris: Secondary | ICD-10-CM | POA: Diagnosis not present

## 2023-05-02 DIAGNOSIS — H6123 Impacted cerumen, bilateral: Secondary | ICD-10-CM | POA: Diagnosis not present

## 2023-05-03 ENCOUNTER — Other Ambulatory Visit (HOSPITAL_BASED_OUTPATIENT_CLINIC_OR_DEPARTMENT_OTHER): Payer: Self-pay | Admitting: Family Medicine

## 2023-05-03 DIAGNOSIS — R0989 Other specified symptoms and signs involving the circulatory and respiratory systems: Secondary | ICD-10-CM

## 2023-05-03 DIAGNOSIS — I2510 Atherosclerotic heart disease of native coronary artery without angina pectoris: Secondary | ICD-10-CM

## 2023-05-22 ENCOUNTER — Ambulatory Visit (HOSPITAL_BASED_OUTPATIENT_CLINIC_OR_DEPARTMENT_OTHER)
Admission: RE | Admit: 2023-05-22 | Discharge: 2023-05-22 | Disposition: A | Discharge status: Home / Self Care | Payer: Self-pay | Source: Ambulatory Visit | Attending: Family Medicine | Admitting: Family Medicine

## 2023-05-22 ENCOUNTER — Ambulatory Visit (HOSPITAL_BASED_OUTPATIENT_CLINIC_OR_DEPARTMENT_OTHER)
Admission: RE | Admit: 2023-05-22 | Discharge: 2023-05-22 | Disposition: A | Discharge status: Home / Self Care | Payer: Medicare Other | Source: Ambulatory Visit | Attending: Family Medicine | Admitting: Family Medicine

## 2023-05-22 DIAGNOSIS — I2510 Atherosclerotic heart disease of native coronary artery without angina pectoris: Secondary | ICD-10-CM | POA: Insufficient documentation

## 2023-05-22 DIAGNOSIS — R0989 Other specified symptoms and signs involving the circulatory and respiratory systems: Secondary | ICD-10-CM | POA: Diagnosis not present

## 2023-05-22 DIAGNOSIS — I6523 Occlusion and stenosis of bilateral carotid arteries: Secondary | ICD-10-CM | POA: Diagnosis not present

## 2023-06-11 ENCOUNTER — Other Ambulatory Visit: Payer: Self-pay | Admitting: Obstetrics and Gynecology

## 2023-06-11 DIAGNOSIS — Z1231 Encounter for screening mammogram for malignant neoplasm of breast: Secondary | ICD-10-CM

## 2023-06-26 ENCOUNTER — Ambulatory Visit
Admission: RE | Admit: 2023-06-26 | Discharge: 2023-06-26 | Disposition: A | Discharge status: Home / Self Care | Payer: Medicare Other | Source: Ambulatory Visit | Attending: Obstetrics and Gynecology | Admitting: Obstetrics and Gynecology

## 2023-06-26 DIAGNOSIS — Z1231 Encounter for screening mammogram for malignant neoplasm of breast: Secondary | ICD-10-CM

## 2023-07-23 DIAGNOSIS — L821 Other seborrheic keratosis: Secondary | ICD-10-CM | POA: Diagnosis not present

## 2023-07-23 DIAGNOSIS — L814 Other melanin hyperpigmentation: Secondary | ICD-10-CM | POA: Diagnosis not present

## 2023-07-23 DIAGNOSIS — L578 Other skin changes due to chronic exposure to nonionizing radiation: Secondary | ICD-10-CM | POA: Diagnosis not present

## 2023-07-23 DIAGNOSIS — D225 Melanocytic nevi of trunk: Secondary | ICD-10-CM | POA: Diagnosis not present

## 2023-07-23 DIAGNOSIS — L820 Inflamed seborrheic keratosis: Secondary | ICD-10-CM | POA: Diagnosis not present

## 2023-07-23 DIAGNOSIS — L738 Other specified follicular disorders: Secondary | ICD-10-CM | POA: Diagnosis not present

## 2023-07-30 DIAGNOSIS — M810 Age-related osteoporosis without current pathological fracture: Secondary | ICD-10-CM | POA: Diagnosis not present

## 2023-08-07 ENCOUNTER — Other Ambulatory Visit (HOSPITAL_COMMUNITY): Payer: Self-pay

## 2023-08-07 ENCOUNTER — Encounter (HOSPITAL_BASED_OUTPATIENT_CLINIC_OR_DEPARTMENT_OTHER): Payer: Self-pay | Admitting: Nurse Practitioner

## 2023-08-07 ENCOUNTER — Ambulatory Visit (HOSPITAL_BASED_OUTPATIENT_CLINIC_OR_DEPARTMENT_OTHER): Admitting: Nurse Practitioner

## 2023-08-07 VITALS — BP 132/60 | HR 69 | Ht 67.5 in | Wt 154.5 lb

## 2023-08-07 DIAGNOSIS — R931 Abnormal findings on diagnostic imaging of heart and coronary circulation: Secondary | ICD-10-CM | POA: Diagnosis not present

## 2023-08-07 DIAGNOSIS — Z789 Other specified health status: Secondary | ICD-10-CM | POA: Diagnosis not present

## 2023-08-07 DIAGNOSIS — I2510 Atherosclerotic heart disease of native coronary artery without angina pectoris: Secondary | ICD-10-CM

## 2023-08-07 DIAGNOSIS — E785 Hyperlipidemia, unspecified: Secondary | ICD-10-CM | POA: Diagnosis not present

## 2023-08-07 DIAGNOSIS — J439 Emphysema, unspecified: Secondary | ICD-10-CM | POA: Diagnosis not present

## 2023-08-07 DIAGNOSIS — R011 Cardiac murmur, unspecified: Secondary | ICD-10-CM | POA: Diagnosis not present

## 2023-08-07 DIAGNOSIS — R0609 Other forms of dyspnea: Secondary | ICD-10-CM | POA: Diagnosis not present

## 2023-08-07 MED ORDER — ATORVASTATIN CALCIUM 10 MG PO TABS: 10.0000 mg | ORAL_TABLET | ORAL | 0 refills | Status: DC

## 2023-08-07 MED ORDER — NEXLETOL 180 MG PO TABS: 180.0000 mg | ORAL_TABLET | Freq: Every day | ORAL | 3 refills | Status: DC

## 2023-08-07 NOTE — Patient Instructions (Signed)
 Medication Instructions:   CHANGE Atorvastatin  one (1) tablet by mouth ( 10 mg) three times weekly.  START Take Nexletol  one ( 1 ) tablet by mouth (180 mg total) by mouth daily.   DISCONTINUE Rosuvastatin.   *If you need a refill on your cardiac medications before your next appointment, please call your pharmacy*  Lab Work:  None ordered.  If you have labs (blood work) drawn today and your tests are completely normal, you will receive your results only by: MyChart Message (if you have MyChart) OR A paper copy in the mail If you have any lab test that is abnormal or we need to change your treatment, we will call you to review the results.  Testing/Procedures:  Your physician has requested that you have an echocardiogram. Echocardiography is a painless test that uses sound waves to create images of your heart. It provides your doctor with information about the size and shape of your heart and how well your heart's chambers and valves are working. This procedure takes approximately one hour. There are no restrictions for this procedure. Please do NOT wear cologne, perfume or lotions (deodorant is allowed). Please arrive 15 minutes prior to your appointment time.     Please report to Radiology at the [facility] Main Entrance 30 minutes early for your test.  [address] [place], [place] [address]  How to Prepare for Your Cardiac PET/CT Stress Test:  Nothing to eat or drink, except water, 3 hours prior to arrival time.  NO caffeine/decaffeinated products, or chocolate 12 hours prior to arrival. (Please note decaffeinated beverages (teas/coffees) still contain caffeine).  If you have caffeine within 12 hours prior, the test will need to be rescheduled.  Medication instructions:  You may take your remaining medications with water.  NO perfume, cologne or lotion on chest or abdomen area. FEMALES - Please avoid wearing dresses to this appointment.  Total time is 1 to 2  hours; you may want to bring reading material for the waiting time.   In preparation for your appointment, medication and supplies will be purchased.  Appointment availability is limited, so if you need to cancel or reschedule, please call the Radiology Department Scheduler at [phone] 24 hours in advance to avoid a cancellation fee of $100.00  What to Expect When you Arrive:  Once you arrive and check in for your appointment, you will be taken to a preparation room within the Radiology Department.  A technologist or Nurse will obtain your medical history, verify that you are correctly prepped for the exam, and explain the procedure.  Afterwards, an IV will be started in your arm and electrodes will be placed on your skin for EKG monitoring during the stress portion of the exam. Then you will be escorted to the PET/CT scanner.  There, staff will get you positioned on the scanner and obtain a blood pressure and EKG.  During the exam, you will continue to be connected to the EKG and blood pressure machines.  A small, safe amount of a radioactive tracer will be injected in your IV to obtain a series of pictures of your heart along with an injection of a stress agent.    After your Exam:  It is recommended that you eat a meal and drink a caffeinated beverage to counter act any effects of the stress agent.  Drink plenty of fluids for the remainder of the day and urinate frequently for the first couple of hours after the exam.  Your doctor will inform you of your test results within 7-10 business days.  For more information and frequently asked questions, please visit our website: [email or web address]  For questions about your test or how to prepare for your test, please call: Cardiac Imaging Nurse Navigators Office: [phone]    Follow-Up: At [facility] you and your health needs are our priority.  As part of our continuing mission to provide you with exceptional  heart care, our providers are all part of one team.  This team includes your primary Cardiologist (physician) and Advanced Practice Providers or APPs (Physician Assistants and Nurse Practitioners) who all work together to provide you with the care you need, when you need it.  Your next appointment:   3 month(s)  Provider:   Slater Duncan, NP    We recommend signing up for the patient portal called "MyChart".  Sign up information is provided on this After Visit Summary.  MyChart is used to connect with patients for Virtual Visits (Telemedicine).  Patients are able to view lab/test results, encounter notes, upcoming appointments, etc.  Non-urgent messages can be sent to your provider as well.   To learn more about what you can do with MyChart, go to [email or web address].   Other Instructions  Goals: 1: Aim to increase physical activity for a goal of 150 minutes of moderate activity each week 2: Heart healthy diet like Mediterranean diet

## 2023-08-07 NOTE — Progress Notes (Signed)
 Cardiology Office Note:  .   Date:  08/07/2023 ID:  [name], DOB May 09, 1940, MRN [id number] PCP: Helyn Lobstein, MD [facility] Providers Cardiologist:  None   Patient Profile: .      PMH Coronary artery disease CT Calcium  Score 05/22/2023 CAC Score716 (85th percentile) LM 72.6, LAD 232, LCx 33.1, RCA 378 Emphysema Lung cancer (diagnosed 2014) Polymyalgia rheumatica Depression Generalized anxiety Hyperlipidemia Carotid artery disease 1-49% bilateral stenosis on duplex 05/22/23       History of Present Illness: .    History of Present Illness [name] is a very pleasant 83 y.o. female  who is here today for new patient consult for elevated coronary calcium  score. She reports increased evidence of coronary calcification on yearly CTs for lung cancer screening and shortness of breath. She reports shortness of breath occurs at times when she is walking fast or feeling stressed. She denies chest pain.  She is not involved in any regular exercise program.  She admits to dietary indiscretion with "all the foods I shouldn't eat" like pizza and pasta but she does not eat out often. She has been alternating her cholesterol medications, atorvastatin  and rosuvastatin because rosuvastatin has worsened her constipation.  She had muscle aches on higher dose of atorvastatin , but is tolerating 10 mg without any concerning symptoms. She reports that her emphysema has worsened, according to a recent radiology report, but her lung doctor does not believe there has been a significant change. She has nasal congestion which sometimes causes her to wake up at night feeling like she cannot breathe.  No orthopnea or PND.   Family history: Her family history includes Cancer - Other in her brother; Diabetes Mellitus II in her father; Hyperlipidemia in her father; Hypertension in her father; Lung cancer in her father.   Discussed the use of AI scribe software for clinical note transcription with  the patient, who gave verbal consent to proceed.  ASCVD Risk Score: The ASCVD Risk score (Arnett DK, et al., 2019) failed to calculate for the following reasons:   The 2019 ASCVD risk score is only valid for ages 81 to 59    Diet: Unrestricted diet  Does not eat out often Loves pizza and pasta Loves chocolate but limits Mixes sweet cereal with bran flakes Skim milk   Activity: House and yard work, including vacuuming   No results found for: "LIPOA"   ROS: See HPI       Studies Reviewed: Aaron Aas   EKG Interpretation Date/Time:  Wednesday August 07 2023 13:55:02 EDT Ventricular Rate:  69 PR Interval:  158 QRS Duration:  86 QT Interval:  410 QTC Calculation: 439 R Axis:   77  Text Interpretation: Sinus rhythm with Premature atrial complexes with Abberant conduction Confirmed by Slater Duncan [phone]) on 08/07/2023 2:13:03 PM      Risk Assessment/Calculations:             Physical Exam:   VS: BP 132/60   Pulse 69   Ht 5' 7.5" (1.715 m)   Wt 154 lb 8 oz (70.1 kg)   SpO2 95%   BMI 23.84 kg/m   Wt Readings from Last 3 Encounters:  08/07/23 154 lb 8 oz (70.1 kg)  04/30/23 151 lb (68.5 kg)  04/24/22 148 lb (67.1 kg)     GEN: Well nourished, well developed in no acute distress NECK: No JVD; No carotid bruits CARDIAC: RRR, 2/6 systolic murmur. No rubs, gallops RESPIRATORY:  Clear to auscultation without  rales, wheezing or rhonchi  ABDOMEN: Soft, non-tender, non-distended EXTREMITIES:  No edema; No deformity     ASSESSMENT AND PLAN: .    Assessment & Plan Coronary artery disease CT calcium  score 05/22/2023 with CAC score of 716 (85th percentile) with bulk of calcification in RCA and LAD.  She reports worsening shortness of breath with exertion and stress.  She does not have any chest pain, palpitations, orthopnea, PND, presyncope, or syncope. We will get cardiac PET CT to evaluate myocardial perfusion. We are also getting an echo in the setting of murmur. LDL is not at  goal of < 70 and she did not tolerate rosuvastatin.  Currently on atorvastatin  10 mg 3 days a week.  She did not tolerate higher doses of atorvastatin .  Given intolerance of multiple statins and CAD, will see if Nexletol  will be covered. Consider a lipoprotein A blood test for additional risk factors.  Shortness of breath   She reports increased SOB, most notably with increased exertion or stress.  Calcification noted on lung CT which she was told has worsened over the years she has been getting the scans. CT calcium  score reveals significant calcification in RCA and LAD. Murmur noted on exam. We are getting echo to evaluate heart and valve function.  We are getting PET/CT to evaluate for ischemia. Consider dyspnea secondary to chronic lung disease if cardiac tests are unrevealing.    Heart murmur   Systolic murmur on exam. She reports this has never been reported to her. We will get an echocardiogram to assess valve function.   Hyperlipidemia  LDL goal < 70/Statin Intolerance Lipid panel 10/16/2022: Total choleserol 196, triglycerides 104, HDL 73, LDL-C 105. She reports rosuvastatin 10 mg daily has worsened her constipation. She is taking atorvastatin  10 mg 3 days a week with no concerning side effects.  She could not tolerate higher doses of atorvastatin .  She does not want to consider injectable lipid lowering therapies. Discontinue rosuvastatin due to constipation and continue atorvastatin  10 mg three days a week. Attempt approval for Nexletol  (bempedoic acid ) as an alternative therapy. Recheck cholesterol levels two-three months after starting Nexletol , if approved. Will get LP(a) at that time.   Emphysema   Worsening on imaging, though the pulmonologist disagrees. Exertional dyspnea may be partially attributed to emphysema. We are getting cardiac PET CT to rule out ischemia.  Management per pulmonology.   Plan/Goals: 1: Aim to increase physical activity for a goal of 150 minutes of moderate  activity each week 2: Heart healthy diet like Mediterranean diet       Informed Consent   Shared Decision Making/Informed Consent The risks [chest pain, shortness of breath, cardiac arrhythmias, dizziness, blood pressure fluctuations, myocardial infarction, stroke/transient ischemic attack, nausea, vomiting, allergic reaction, radiation exposure, metallic taste sensation and life-threatening complications (estimated to be 1 in 10,000)], benefits (risk stratification, diagnosing coronary artery disease, treatment guidance) and alternatives of a cardiac PET stress test were discussed in detail with [name] and she agrees to proceed.     Disposition: 2-3 months with me  Signed, Slater Duncan, NP-C

## 2023-08-08 ENCOUNTER — Other Ambulatory Visit (HOSPITAL_COMMUNITY): Payer: Self-pay

## 2023-08-08 ENCOUNTER — Telehealth: Payer: Self-pay | Admitting: Pharmacy Technician

## 2023-08-08 DIAGNOSIS — Z789 Other specified health status: Secondary | ICD-10-CM

## 2023-08-08 DIAGNOSIS — I2510 Atherosclerotic heart disease of native coronary artery without angina pectoris: Secondary | ICD-10-CM

## 2023-08-08 DIAGNOSIS — E785 Hyperlipidemia, unspecified: Secondary | ICD-10-CM

## 2023-08-08 NOTE — Telephone Encounter (Signed)
 Yes, please order NMR, LP(a) and CMET. Thank you

## 2023-08-08 NOTE — Telephone Encounter (Signed)
 Ok to order repeat labs?

## 2023-08-08 NOTE — Telephone Encounter (Signed)
 Hi, insurance is requesting labs done in the last 6 months. The last labs looks to be done in July. Can we get updated labs so we can submit this prior authorization for Nexletol ? Thank you!

## 2023-08-12 DIAGNOSIS — E785 Hyperlipidemia, unspecified: Secondary | ICD-10-CM | POA: Diagnosis not present

## 2023-08-12 DIAGNOSIS — I2510 Atherosclerotic heart disease of native coronary artery without angina pectoris: Secondary | ICD-10-CM | POA: Diagnosis not present

## 2023-08-12 DIAGNOSIS — Z789 Other specified health status: Secondary | ICD-10-CM | POA: Diagnosis not present

## 2023-08-13 LAB — COMPREHENSIVE METABOLIC PANEL WITH GFR
ALT: 19 IU/L (ref 0–32)
AST: 22 IU/L (ref 0–40)
Albumin: 4.2 g/dL (ref 3.7–4.7)
Alkaline Phosphatase: 90 IU/L (ref 44–121)
BUN/Creatinine Ratio: 26 (ref 12–28)
BUN: 22 mg/dL (ref 8–27)
Bilirubin Total: 0.4 mg/dL (ref 0.0–1.2)
CO2: 23 mmol/L (ref 20–29)
Calcium: 9.1 mg/dL (ref 8.7–10.3)
Chloride: 104 mmol/L (ref 96–106)
Creatinine, Ser: 0.85 mg/dL (ref 0.57–1.00)
Globulin, Total: 2.5 g/dL (ref 1.5–4.5)
Glucose: 88 mg/dL (ref 70–99)
Potassium: 4.7 mmol/L (ref 3.5–5.2)
Sodium: 142 mmol/L (ref 134–144)
Total Protein: 6.7 g/dL (ref 6.0–8.5)
eGFR: 69 mL/min/{1.73_m2} (ref >59)

## 2023-08-13 LAB — NMR, LIPOPROFILE
Cholesterol, Total: 191 mg/dL (ref 100–199)
HDL Particle Number: 38.5 umol/L (ref >=30.5)
HDL-C: 81 mg/dL (ref >39)
LDL Particle Number: 746 nmol/L (ref <1000)
LDL Size: 21.1 nm (ref >20.5)
LDL-C (NIH Calc): 95 mg/dL (ref 0–99)
LP-IR Score: <25 (ref <=45)
Small LDL Particle Number: <90 nmol/L (ref <=527)
Triglycerides: 86 mg/dL (ref 0–149)

## 2023-08-13 LAB — LIPOPROTEIN A (LPA): Lipoprotein (a): 157.2 nmol/L — ABNORMAL HIGH (ref <75.0)

## 2023-08-15 ENCOUNTER — Encounter (HOSPITAL_BASED_OUTPATIENT_CLINIC_OR_DEPARTMENT_OTHER): Payer: Self-pay

## 2023-08-29 ENCOUNTER — Other Ambulatory Visit (HOSPITAL_BASED_OUTPATIENT_CLINIC_OR_DEPARTMENT_OTHER)

## 2023-09-04 ENCOUNTER — Ambulatory Visit (INDEPENDENT_AMBULATORY_CARE_PROVIDER_SITE_OTHER)

## 2023-09-04 DIAGNOSIS — I2510 Atherosclerotic heart disease of native coronary artery without angina pectoris: Secondary | ICD-10-CM

## 2023-09-04 DIAGNOSIS — R0609 Other forms of dyspnea: Secondary | ICD-10-CM

## 2023-09-04 DIAGNOSIS — R931 Abnormal findings on diagnostic imaging of heart and coronary circulation: Secondary | ICD-10-CM

## 2023-09-04 LAB — ECHOCARDIOGRAM COMPLETE
AR max vel: 2.54 cm2
AV Area VTI: 2.43 cm2
AV Area mean vel: 2.29 cm2
AV Mean grad: 2 mmHg
AV Peak grad: 4 mmHg
Ao pk vel: 1 m/s
Area-P 1/2: 3.6 cm2
MV M vel: 4.83 m/s
MV Peak grad: 93.3 mmHg
Radius: 0.9 cm
S' Lateral: 2.65 cm

## 2023-09-05 ENCOUNTER — Ambulatory Visit (HOSPITAL_BASED_OUTPATIENT_CLINIC_OR_DEPARTMENT_OTHER): Payer: Self-pay | Admitting: Family

## 2023-09-06 ENCOUNTER — Encounter (HOSPITAL_BASED_OUTPATIENT_CLINIC_OR_DEPARTMENT_OTHER): Payer: Self-pay | Admitting: *Deleted

## 2023-09-11 DIAGNOSIS — M810 Age-related osteoporosis without current pathological fracture: Secondary | ICD-10-CM | POA: Diagnosis not present

## 2023-09-11 DIAGNOSIS — Z853 Personal history of malignant neoplasm of breast: Secondary | ICD-10-CM | POA: Diagnosis not present

## 2023-09-11 DIAGNOSIS — Z01419 Encounter for gynecological examination (general) (routine) without abnormal findings: Secondary | ICD-10-CM | POA: Diagnosis not present

## 2023-10-17 DIAGNOSIS — E7800 Pure hypercholesterolemia, unspecified: Secondary | ICD-10-CM | POA: Diagnosis not present

## 2023-10-17 DIAGNOSIS — M810 Age-related osteoporosis without current pathological fracture: Secondary | ICD-10-CM | POA: Diagnosis not present

## 2023-10-17 DIAGNOSIS — Z0000 Encounter for general adult medical examination without abnormal findings: Secondary | ICD-10-CM | POA: Diagnosis not present

## 2023-10-17 DIAGNOSIS — R7303 Prediabetes: Secondary | ICD-10-CM | POA: Diagnosis not present

## 2023-10-17 DIAGNOSIS — Z1331 Encounter for screening for depression: Secondary | ICD-10-CM | POA: Diagnosis not present

## 2023-10-24 DIAGNOSIS — M810 Age-related osteoporosis without current pathological fracture: Secondary | ICD-10-CM | POA: Diagnosis not present

## 2023-10-24 DIAGNOSIS — J310 Chronic rhinitis: Secondary | ICD-10-CM | POA: Diagnosis not present

## 2023-10-24 DIAGNOSIS — M7711 Lateral epicondylitis, right elbow: Secondary | ICD-10-CM | POA: Diagnosis not present

## 2023-10-24 DIAGNOSIS — E7800 Pure hypercholesterolemia, unspecified: Secondary | ICD-10-CM | POA: Diagnosis not present

## 2023-10-24 DIAGNOSIS — F419 Anxiety disorder, unspecified: Secondary | ICD-10-CM | POA: Diagnosis not present

## 2023-10-24 DIAGNOSIS — R42 Dizziness and giddiness: Secondary | ICD-10-CM | POA: Diagnosis not present

## 2023-10-24 DIAGNOSIS — M25511 Pain in right shoulder: Secondary | ICD-10-CM | POA: Diagnosis not present

## 2023-10-24 DIAGNOSIS — K5909 Other constipation: Secondary | ICD-10-CM | POA: Diagnosis not present

## 2023-10-24 DIAGNOSIS — K219 Gastro-esophageal reflux disease without esophagitis: Secondary | ICD-10-CM | POA: Diagnosis not present

## 2023-10-24 DIAGNOSIS — M791 Myalgia, unspecified site: Secondary | ICD-10-CM | POA: Diagnosis not present

## 2023-10-24 DIAGNOSIS — I340 Nonrheumatic mitral (valve) insufficiency: Secondary | ICD-10-CM | POA: Diagnosis not present

## 2023-10-24 DIAGNOSIS — I2510 Atherosclerotic heart disease of native coronary artery without angina pectoris: Secondary | ICD-10-CM | POA: Diagnosis not present

## 2023-10-30 ENCOUNTER — Other Ambulatory Visit (HOSPITAL_BASED_OUTPATIENT_CLINIC_OR_DEPARTMENT_OTHER): Payer: Self-pay | Admitting: Nurse Practitioner

## 2023-11-04 ENCOUNTER — Telehealth (HOSPITAL_COMMUNITY): Payer: Self-pay | Admitting: *Deleted

## 2023-11-04 NOTE — Telephone Encounter (Signed)
 Reaching out to patient to offer assistance regarding upcoming cardiac imaging study; pt verbalizes understanding of appt date/time, parking situation and where to check in, pre-test NPO status, and verified current allergies; name and call back number provided for further questions should they arise  Melinda Requena RN Navigator Cardiac Imaging [facility] [phone] office [phone] cell  Patient aware to avoid caffeine for her Wednesday, July 23 cardiac PET appt.

## 2023-11-05 NOTE — Progress Notes (Signed)
 Cardiology Office Note:  .   Date:  11/11/2023 ID:  [name], DOB March 05, 1941, MRN [id number] PCP: Melinda Harvey, MD [facility] Providers Cardiologist:  None   Patient Profile: .      PMH Coronary artery disease CT Calcium  Score 05/22/2023 CAC Score716 (85th percentile) LM 72.6, LAD 232, LCx 33.1, RCA 378 Emphysema Lung cancer (diagnosed 2014) Polymyalgia rheumatica Depression Generalized anxiety Hyperlipidemia Carotid artery disease 1-49% bilateral stenosis on duplex 05/22/23    Referred to cardiology for elevated coronary artery calcium  score and seen by me on 08/07/23. Increased evidence of coronary calcification on yearly CTs for lung cancer screening and shortness of breath that occurs with walking fast or feeling stressed. No chest pain. Not involved in any regular exercise program.  Admits dietary indiscretion with all the foods I shouldn't eat like pizza and pasta but she does not eat out often. Has been alternating her cholesterol medications, atorvastatin  and rosuvastatin because rosuvastatin has worsened her constipation.  She had muscle aches on higher dose of atorvastatin , but is tolerating 10 mg without any concerning symptoms. Emphysema has worsened, according to a recent radiology report, but her lung doctor does not believe there has been a significant change. She has nasal congestion which sometimes causes her to wake up at night feeling like she cannot breathe.  No orthopnea or PND. Family hx not significant for early CAD. LP(a) elevated at 157.2, LDL-C 95, small LDL-P <90. She did not want to use injectable lipid lowering therapy, so she was advised to start Nexletol  180 mg daily in addition to atorvastatin  10 mg daily.   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 She was advised to follow home BP and try lasix 20 mg daily, however she elected to hold off. Cardiac  PET CT revealed defect with moderate reduction in uptake in mid to basal inferior and inferoseptal location that is reversible consistent with ischemia, normal EF at stress and rest and decreased global myocardial blood flow.     History of Present Illness: .    History of Present Illness  Discussed the use of AI scribe software for clinical note transcription with the patient, who gave verbal consent to proceed.  [name] is a very pleasant 83 year old female who is here today for follow-up of coronary artery disease. She experiences shortness of breath and fatigue, with difficulty walking long distances and needing frequent rest. No chest pain, palpitations, presyncope, syncope, orthopnea or PND.  She has frequent nasal congestion as well due to small nasal passageways. we reviewed details of recent cardiac PET imaging and echocardiogram and questions were answered to her satisfaction. She never received Nexletol  which needed PA. Family history is significant in heart and valve problems, interestingly with family members younger than herself and her siblings requiring procedures. Her daughter has heart and valve problems as well which she believes will require surgery.     Diet: Unrestricted diet  Does not eat out often Loves pizza and pasta Loves chocolate but limits Mixes sweet cereal with bran flakes Skim milk   Activity: House and yard work, including vacuuming   Lipoprotein (a)  Date/Time Value Ref Range Status  08/12/2023 10:05 AM 157.2 (H) <75.0 nmol/L Final    Comment:    Note:  Values greater than or equal to 75.0 nmol/L may        indicate an independent risk factor for CHD,  but must be evaluated with caution when applied        to non-Caucasian populations due to the        influence of genetic factors on Lp(a) across        ethnicities.      ROS: See HPI       Studies Reviewed: [name]   EKG Interpretation Date/Time:  Monday November 11 2023 14:23:02  EDT Ventricular Rate:  62 PR Interval:  156 QRS Duration:  90 QT Interval:  432 QTC Calculation: 438 R Axis:   50  Text Interpretation: Normal sinus rhythm Nonspecific T wave abnormality No significant change since last tracing Confirmed by [name] [phone]) on 11/11/2023 2:29:14 PM      Risk Assessment/Calculations:             Physical Exam:   VS: BP (!) 124/50   Pulse 62   Ht 5' 6.5 (1.689 m)   Wt 150 lb (68 kg)   SpO2 95%   BMI 23.85 kg/m   Wt Readings from Last 3 Encounters:  11/11/23 150 lb (68 kg)  08/07/23 154 lb 8 oz (70.1 kg)  04/30/23 151 lb (68.5 kg)     GEN: Well nourished, well developed in no acute distress NECK: No JVD; No carotid bruits CARDIAC: RRR, 2/6 systolic murmur. No rubs, gallops RESPIRATORY:  Clear to auscultation without rales, wheezing or rhonchi  ABDOMEN: Soft, non-tender, non-distended EXTREMITIES:  No edema; No deformity     ASSESSMENT AND PLAN: .    Assessment & Plan Coronary artery disease with stable angina DOE CT calcium  score 05/22/2023 with CAC score of 716 (85th percentile) with bulk of calcification in RCA and LAD.  She has DOE with moderate activity including walking a long distance. She also tires easily. No chest pain, palpitations, orthopnea, edema, PND, presyncope, or syncope.  Cardiac PET/CT reveals large defect with moderate reduction in uptake in mid to basal inferior and inferior septal locations that is reversible, consistent with ischemia. Normal EF.  Reduced global myocardial blood flow. Consideration given to proceeding with cardiac catheterization. Risks reviewed and she agrees to proceed, however upon discussion with Dr. Wendel, he will see her in the office in a few days for evaluation prior to scheduling cath. - Continue aspirin, amlodipine, atorvastatin  - Consider additional lipid-lowering therapy as noted below -Consult appointment with Dr. Wendel later this week    Hyperlipidemia  LDL goal < 70 Statin  Intolerance Elevated LP(a) LP(a) elevated at 157.2. Lipid panel completed 08/12/2023 with LDL particle #746, LDL-C 95, HDL-C 81, triglycerides 86, total cholesterol 191 and small  LDL-P < 90. Slight improvement from LDL-C 105 one year prior. Felt that rosuvastatin 10 mg daily worsened constipation. She is taking atorvastatin  10 mg 3 days a week with no concerning side effects. Could not tolerate higher doses of atorvastatin . Encouraged that we try adjunct lipid lowering agent, however she does not want to consider injectable lipid lowering therapies. Discontinue rosuvastatin due to constipation and continue atorvastatin  10 mg three days a week.  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 Advised DOE may be secondary to valve dysfunction.  -Offered Lasix to see if symptoms improve, she declines  for now -Consult with Dr. Wendel for evaluation of structural heart disease   Hypertension   Blood pressure is well-controlled on the current medication regimen.  -Continue amlodipine      Informed Consent   Shared Decision Making/Informed Consent The risks [stroke (1 in 1000), death (1 in 1000), kidney failure [usually temporary] (1 in 500), bleeding (1 in 200), allergic reaction [possibly serious] (1 in 200)], benefits (diagnostic support and management of coronary artery disease) and alternatives of a cardiac catheterization were discussed in detail with Ms. Flynn and she is willing to proceed.            Disposition: TBD after review with Dr. Wendel  Signed, Rosaline Bane, NP-C

## 2023-11-06 ENCOUNTER — Ambulatory Visit (HOSPITAL_COMMUNITY)
Admission: RE | Admit: 2023-11-06 | Discharge: 2023-11-06 | Disposition: A | Discharge status: Home / Self Care | Source: Ambulatory Visit | Attending: Nurse Practitioner | Admitting: Nurse Practitioner

## 2023-11-06 DIAGNOSIS — I2510 Atherosclerotic heart disease of native coronary artery without angina pectoris: Secondary | ICD-10-CM | POA: Insufficient documentation

## 2023-11-06 DIAGNOSIS — R0609 Other forms of dyspnea: Secondary | ICD-10-CM | POA: Diagnosis not present

## 2023-11-06 DIAGNOSIS — R931 Abnormal findings on diagnostic imaging of heart and coronary circulation: Secondary | ICD-10-CM | POA: Insufficient documentation

## 2023-11-06 LAB — NM PET CT CARDIAC PERFUSION MULTI W/ABSOLUTE BLOODFLOW
LV dias vol: 95 mL (ref 46–106)
LV sys vol: 38 mL (ref 3.8–5.2)
MBFR: 1.67
Nuc Rest EF: 60 %
Nuc Stress EF: 63 %
Rest MBF: 0.85 ml/g/min
Rest Nuclear Isotope Dose: 17.5 mCi
ST Depression (mm): 0 mm
Stress MBF: 1.42 ml/g/min
Stress Nuclear Isotope Dose: 18.3 mCi

## 2023-11-06 MED ORDER — RUBIDIUM RB82 GENERATOR (RUBYFILL)
17.5400 | PACK | Freq: Once | INTRAVENOUS | Status: AC
  Administered 2023-11-06: 17.54 via INTRAVENOUS

## 2023-11-06 MED ORDER — REGADENOSON 0.4 MG/5ML IV SOLN
0.4000 mg | Freq: Once | INTRAVENOUS | Status: AC
  Administered 2023-11-06: 0.4 mg via INTRAVENOUS

## 2023-11-06 MED ORDER — RUBIDIUM RB82 GENERATOR (RUBYFILL)
18.2800 | PACK | Freq: Once | INTRAVENOUS | Status: AC
  Administered 2023-11-06: 18.28 via INTRAVENOUS

## 2023-11-06 MED ORDER — REGADENOSON 0.4 MG/5ML IV SOLN
INTRAVENOUS | Status: AC
  Filled 2023-11-06: qty 5

## 2023-11-06 NOTE — Progress Notes (Signed)
 Pt. Tolerated lexi scan well.

## 2023-11-11 ENCOUNTER — Encounter (HOSPITAL_BASED_OUTPATIENT_CLINIC_OR_DEPARTMENT_OTHER): Payer: Self-pay | Admitting: Nurse Practitioner

## 2023-11-11 ENCOUNTER — Other Ambulatory Visit (HOSPITAL_COMMUNITY): Payer: Self-pay

## 2023-11-11 ENCOUNTER — Ambulatory Visit (HOSPITAL_BASED_OUTPATIENT_CLINIC_OR_DEPARTMENT_OTHER): Admitting: Nurse Practitioner

## 2023-11-11 ENCOUNTER — Telehealth: Payer: Self-pay | Admitting: Pharmacy Technician

## 2023-11-11 VITALS — BP 124/50 | HR 62 | Ht 66.5 in | Wt 150.0 lb

## 2023-11-11 DIAGNOSIS — I25118 Atherosclerotic heart disease of native coronary artery with other forms of angina pectoris: Secondary | ICD-10-CM | POA: Diagnosis not present

## 2023-11-11 DIAGNOSIS — Z789 Other specified health status: Secondary | ICD-10-CM

## 2023-11-11 DIAGNOSIS — E7841 Elevated Lipoprotein(a): Secondary | ICD-10-CM

## 2023-11-11 DIAGNOSIS — I2583 Coronary atherosclerosis due to lipid rich plaque: Secondary | ICD-10-CM | POA: Diagnosis not present

## 2023-11-11 DIAGNOSIS — E785 Hyperlipidemia, unspecified: Secondary | ICD-10-CM | POA: Diagnosis not present

## 2023-11-11 DIAGNOSIS — I2510 Atherosclerotic heart disease of native coronary artery without angina pectoris: Secondary | ICD-10-CM

## 2023-11-11 DIAGNOSIS — R0609 Other forms of dyspnea: Secondary | ICD-10-CM

## 2023-11-11 DIAGNOSIS — I340 Nonrheumatic mitral (valve) insufficiency: Secondary | ICD-10-CM

## 2023-11-11 MED ORDER — NEXLETOL 180 MG PO TABS: 180.0000 mg | ORAL_TABLET | Freq: Every day | ORAL | 3 refills | Status: DC

## 2023-11-11 NOTE — Telephone Encounter (Signed)
 She never got her Nexletol . Can you check on PA - must be filled at [facility].  Thank you, Rosaline

## 2023-11-11 NOTE — Patient Instructions (Signed)
 Medication Instructions:   Your physician recommends that you continue on your current medications as directed. Please refer to the Current Medication list given to you today.   *If you need a refill on your cardiac medications before your next appointment, please call your pharmacy*  Lab Work:  TODAY!!!! BMET/CBC  If you have labs (blood work) drawn today and your tests are completely normal, you will receive your results only by: MyChart Message (if you have MyChart) OR A paper copy in the mail If you have any lab test that is abnormal or we need to change your treatment, we will call you to review the results.  Testing/Procedures:        Cardiac/Peripheral Catheterization   You are scheduled for a Cardiac Catheterization on ,   with Dr.     .  1. Please arrive at the [facility] (Main Entrance A) at [facility]: [address] [place], [place] [address] at (This time is 2 hour(s) before your procedure to ensure your preparation).   Free valet parking service is available. You will check in at ADMITTING. The support person will be asked to wait in the waiting room.  It is OK to have someone drop you off and come back when you are ready to be discharged.        Special note: Every effort is made to have your procedure done on time. Please understand that emergencies sometimes delay scheduled procedures.  2. Diet: Do not eat solid foods after midnight.  You may have clear liquids until 5 AM the day of the procedure.  3. Labs: You will need to have blood drawn on Monday, July 28 at [facility]:  [address] [place], [place] [address] (lab is in the Primary Care office located on the 3rd floor).  Hours: 8:00 am - 4:30 pm (avoid 12:00 pm - 1:00 pm)  Phone: [phone].  Tell staff you are there for blood work and they will direct you to the lab.  You do not need an appointment. You do not need to be fasting.  4. Medication  instructions in preparation for your procedure:   Contrast Allergy: No   On the morning of your procedure, take Aspirin 81 mg and any morning medicines NOT listed above.  You may use sips of water.  5. Plan to go home the same day, you will only stay overnight if medically necessary. 6. You MUST have a responsible adult to drive you home. 7. An adult MUST be with you the first 24 hours after you arrive home. 8. Bring a current list of your medications, and the last time and date medication taken. 9. Bring ID and current insurance cards. 10.Please wear clothes that are easy to get on and off and wear slip-on shoes.  Thank you for allowing us  to care for you!   -- [facility]   Follow-Up: At [facility], you and your health needs are our priority.  As part of our continuing mission to provide you with exceptional heart care, our providers are all part of one team.  This team includes your primary Cardiologist (physician) and Advanced Practice Providers or APPs (Physician Assistants and Nurse Practitioners) who all work together to provide you with the care you need, when you need it.  Your next appointment:       Provider:   Rosaline Bane, NP    We recommend signing up for  the patient portal called MyChart.  Sign up information is provided on this After Visit Summary.  MyChart is used to connect with patients for Virtual Visits (Telemedicine).  Patients are able to view lab/test results, encounter notes, upcoming appointments, etc.  Non-urgent messages can be sent to your provider as well.   To learn more about what you can do with MyChart, go to [email or web address].

## 2023-11-11 NOTE — Telephone Encounter (Signed)
 Pharmacy Patient Advocate Encounter  Received notification from [employer] federal employee that Prior Authorization for nexletol  has been APPROVED from 10/12/23 to 11/10/24. Ran test claim, Copay is $575.81- 3 months. This test claim was processed through [facility]- copay amounts may vary at other pharmacies due to pharmacy/plan contracts, or as the patient moves through the different stages of their insurance plan.   PA #/Case ID/Reference #: [id number]

## 2023-11-11 NOTE — Telephone Encounter (Signed)
 Pharmacy Patient Advocate Encounter   Received notification from Pt Calls Messages that prior authorization for Nexletol  is required/requested.   Insurance verification completed.   The patient is insured through [employer] .   Per test claim: PA required; PA submitted to above mentioned insurance via CoverMyMeds Key/confirmation #/EOC [id number] Status is pending

## 2023-11-12 ENCOUNTER — Telehealth: Payer: Self-pay

## 2023-11-12 ENCOUNTER — Ambulatory Visit: Payer: Self-pay | Admitting: Nurse Practitioner

## 2023-11-12 LAB — BASIC METABOLIC PANEL WITH GFR
BUN/Creatinine Ratio: 19 (ref 12–28)
BUN: 18 mg/dL (ref 8–27)
CO2: 24 mmol/L (ref 20–29)
Calcium: 9.4 mg/dL (ref 8.7–10.3)
Chloride: 103 mmol/L (ref 96–106)
Creatinine, Ser: 0.97 mg/dL (ref 0.57–1.00)
Glucose: 89 mg/dL (ref 70–99)
Potassium: 4.9 mmol/L (ref 3.5–5.2)
Sodium: 142 mmol/L (ref 134–144)
eGFR: 59 mL/min/1.73 — ABNORMAL LOW (ref >59)

## 2023-11-12 LAB — CBC
Hematocrit: 41.8 % (ref 34.0–46.6)
Hemoglobin: 13.2 g/dL (ref 11.1–15.9)
MCH: 29.9 pg (ref 26.6–33.0)
MCHC: 31.6 g/dL (ref 31.5–35.7)
MCV: 95 fL (ref 79–97)
Platelets: 193 x10E3/uL (ref 150–450)
RBC: 4.42 x10E6/uL (ref 3.77–5.28)
RDW: 13.2 % (ref 11.7–15.4)
WBC: 5.5 x10E3/uL (ref 3.4–10.8)

## 2023-11-12 NOTE — Progress Notes (Unsigned)
 Patient ID: [name] MRN: [id number] DOB/AGE: [id number] y.o.  Primary Care Physician:McNeill, Sari, MD Primary Cardiologist: Wendel Referring provider: Percy  CC:  Mitral valvular disease management     FOCUSED PROBLEM LIST:   Mitral regurgitation Moderate to severe, anteriorly directed EF 60-65% TTE May 2025 Coronary artery calcification CT Calcium  Score 2025 Large basal inferior and inferoseptal ischemia PET stress 2025 Hyperlipidemia LP(a) 157 Intolerant of high-dose statins Defers injectable medications On bempedoic acid  Aortic atherosclerosis Chest CT 2025 Carotid artery disease 1-49% bilateral stenosis on duplex 05/22/23 Emphysema Lung cancer (diagnosed 2014) S/p partial lung resection  Polymyalgia rheumatica Depression Generalized anxiety BMI 06 November 2023:  Patient consents to use of AI scribe. The patient is an 83 year old female referred by NP Swinyer for recommendations regarding the patient's mitral valvular disease in conjunction with an abnormal PET stress test.  She had reported exertional dyspnea but none at rest.  Her workup involved an echocardiogram which demonstrated moderate to severe mitral regurgitation which is anteriorly directed.  She also had an abnormal stress test as detailed above.  She experiences shortness of breath, particularly during exertion such as walking far or fast, which has progressively worsened over the past couple of years. She becomes breathless to the point of needing to sit down, especially when walking up hills. No shortness of breath at rest, but she does wake up in the middle of the night feeling short of breath, which resolves upon waking. She uses two pillows at night since breaking her ribs in October, which helps her breathing.   Her past medical history includes lung cancer, for which she underwent a partial lung resection of the right lower lobe approximately ten years ago. She continues to have annual  follow-ups with her  Dr Kerrin. She has a history of smoking, having quit nearly twenty years ago after smoking for a long time. She denies current smoking and is not diabetic.  She experiences lightheadedness and dizziness, described as 'dizzy weeks,' and occasionally takes meclizine , though it is not always effective. No recent episodes of feeling like she might pass out.  She lives alone, is divorced, and manages her daily activities independently, though she notes a lack of energy for cleaning, which she attributes to her shortness of breath. Family history is significant for heart valve issues among her children and grandchildren, and her mother had polymyalgia rheumatica. She has five sisters, none of whom are local, and they suspect they may also have heart valve issues.  She has a history of requiring major dental work but has had no issues recently.     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   Osteoporosis     Past Surgical History:  Procedure Laterality Date   ABDOMINAL HYSTERECTOMY     partial   APPENDECTOMY     BREAST EXCISIONAL BIOPSY Bilateral    benign   BREAST SURGERY Bilateral    bx    EYE SURGERY Bilateral    cataracts   LUNG REMOVAL, PARTIAL Right 01/26/2013   for Stage IA non small cell   TONSILLECTOMY     VIDEO ASSISTED THORACOSCOPY (VATS)/WEDGE RESECTION Right 01/26/2013   Procedure: VIDEO ASSISTED THORACOSCOPY (  VATS) with wedge resection right middle lobe nodule , segmental resection rigth lower lobe lung, lymph node sampling,;  Surgeon: Elspeth JAYSON Millers, MD;  Location: [facility] OR;  Service: Thoracic;  Laterality: Right;    Family History  Problem Relation Age of Onset   Lung cancer Father    Diabetes Mellitus II Father     Hypertension Father    Hyperlipidemia Father    Cancer - Other Brother     Social History   Socioeconomic History   Marital status: Divorced    Spouse name: Not on file   Number of children: Not on file   Years of education: Not on file   Highest education level: Not on file  Occupational History   Not on file  Tobacco Use   Smoking status: Former    Current packs/day: 0.00    Average packs/day: 1 pack/day for 40.0 years (40.0 ttl pk-yrs)    Types: Cigarettes    Start date: 04/16/1965    Quit date: 04/16/2005    Years since quitting: 18.5   Smokeless tobacco: Never  Substance and Sexual Activity   Alcohol  use: No   Drug use: No   Sexual activity: Not on file  Other Topics Concern   Not on file  Social History Narrative   Not on file   Social Drivers of Health   Financial Resource Strain: Not on file  Food Insecurity: Not on file  Transportation Needs: Not on file  Physical Activity: Not on file  Stress: Not on file  Social Connections: Not on file  Intimate Partner Violence: Not on file     Prior to Admission medications   Medication Sig Start Date End Date Taking? Authorizing Provider  ALPRAZolam (XANAX) 0.25 MG tablet As needed for flying 09/04/16   [provider]  amLODipine (NORVASC) 2.5 MG tablet Take 2.5 mg by mouth daily. 05/17/23   [provider]  aspirin 81 MG chewable tablet Chew 81 mg by mouth daily. 06/02/23   [provider]  atorvastatin  (LIPITOR) 10 MG tablet TAKE 1 TABLET (10 MG TOTAL) BY MOUTH 3 (THREE) TIMES A WEEK. TAKES M-W-F. 11/01/23   Swinyer, Rosaline HERO, NP  azelastine (ASTELIN) 0.1 % nasal spray Place 1 spray into both nostrils 2 (two) times daily. 10/24/23   [provider]  Bempedoic Acid  (NEXLETOL ) 180 MG TABS Take 1 tablet (180 mg total) by mouth daily. 11/11/23   Swinyer, Rosaline HERO, NP  Cholecalciferol  (VITAMIN D ) 2000 UNITS tablet Take 1,000 Units by mouth daily.     [provider]  denosumab   (PROLIA ) 60 MG/ML SOSY injection Inject 60 mg into the skin every 6 (six) months.    [provider]  escitalopram (LEXAPRO) 5 MG tablet Take 5 mg by mouth daily.    [provider]  famotidine (PEPCID AC) 10 MG tablet Take 10 mg by mouth daily.    [provider]  fluticasone (FLONASE ALLERGY RELIEF) 50 MCG/ACT nasal spray Place 1 spray into both nostrils daily. 04/25/18   [provider]  hydroxypropyl methylcellulose (ISOPTO TEARS) 2.5 % ophthalmic solution Place 1 drop into both eyes daily.    [provider]  linaclotide (LINZESS) 72 MCG capsule Take 72 mcg by mouth. Per patient alternate taking on days when stomach is not upset 01/26/19   [provider]  meclizine  (ANTIVERT ) 25 MG tablet Take 25 mg by mouth 3 (three) times daily as needed for dizziness.  12/24/12   [provider]  Melatonin 3 MG TBDP 1/2 Orally once a day if needed    [provider]  meloxicam  (MOBIC ) 15 MG tablet Take 15 mg by mouth daily as needed for pain.  12/22/12   [provider]  methocarbamol (ROBAXIN) 500 MG tablet as needed.    [provider]  Multiple Vitamin (MULTIVITAMIN WITH MINERALS) TABS tablet Take 1 tablet by mouth every other day.    [provider]  ofloxacin (OCUFLOX) 0.3 % ophthalmic solution as needed. 12/05/21   [provider]  Omega-3 Krill Oil 500 MG CAPS Take 1 capsule by mouth daily.    [provider]  Omeprazole Magnesium (PRILOSEC OTC PO) Take 1 tablet by mouth daily as needed (reflux).    [provider]    Allergies  Allergen Reactions   Bupropion Other (See Comments)   Doxycycline Other (See Comments)   Duloxetine Hcl Other (See Comments)   Erythromycin Other (See Comments)    Upset stomach   Escitalopram Other (See Comments)   Sertraline Other (See Comments)    REVIEW OF SYSTEMS:  General: no fevers/chills/night sweats Eyes: no blurry vision, diplopia,  or amaurosis ENT: no sore throat or hearing loss Resp: no cough, wheezing, or hemoptysis CV: no edema or palpitations GI: no abdominal pain, nausea, vomiting, diarrhea, or constipation GU: no dysuria, frequency, or hematuria Skin: no rash Neuro: no headache, numbness, tingling, or weakness of extremities Musculoskeletal: no joint pain or swelling Heme: no bleeding, DVT, or easy bruising Endo: no polydipsia or polyuria  BP 124/60   Pulse (!) 55   Ht 5' 6.5 (1.689 m)   Wt 151 lb 6.4 oz (68.7 kg)   SpO2 96%   BMI 24.07 kg/m   PHYSICAL EXAM: GEN:  AO x 3 in no acute distress HEENT: normal Dentition: Normal Neck: JVP normal. +2 carotid upstrokes with bruits bilaterally. No thyromegaly. Lungs: equal expansion, clear bilaterally CV: Apex is discrete and nondisplaced, RRR with 3 out of 6 holosystolic murmur best heard at axilla but present throughout the precordium Abd: soft, non-tender, non-distended; no bruit; positive bowel sounds Ext: no edema, ecchymoses, or cyanosis Vascular: 2+ femoral pulses, 2+ radial pulses       Skin: warm and dry without rash Neuro: CN II-XII grossly intact; motor and sensory grossly intact    DATA AND STUDIES:  EKG: 2025 normal sinus rhythm with nonspecific T wave abnormalities  EKG Interpretation Date/Time:    Ventricular Rate:    PR Interval:    QRS Duration:    QT Interval:    QTC Calculation:   R Axis:      Text Interpretation:          08/12/2023: ALT 19 11/11/2023: BUN 18; Creatinine, Ser 0.97; Hemoglobin 13.2; Platelets 193; Potassium 4.9; Sodium 142   STS RISK CALCULATOR: Pending  NHYA CLASS: 2     ASSESSMENT AND PLAN:   1. Nonrheumatic mitral valve regurgitation   2. Coronary artery calcification   3. Hyperlipidemia LDL goal <70   4. Aortic atherosclerosis (HCC)   5. Bilateral carotid artery stenosis     Mitral regurgitation: The patient has developed NYHA class II symptoms of shortness of breath and fatigue.  She  has severe mitral regurgitation that I suspect is likely degenerative.  I will refer the patient for coronary angiography, right heart catheterization, and a TEE.  Further recommendations will be issued once the TEE has been reviewed. Coronary artery calcification: She has an elevated coronary artery calcification score and  an abnormal stress test.  Will refer for coronary angiography as detailed above. Hyperlipidemia: Continue atorvastatin  10 mg and bempedoic acid  100 mg daily.  Check lipid panel and LFTs in the future. Aortic atherosclerosis: Continue aspirin 81 mg, atorvastatin  10 mg, and bempedoic acid  100 mg daily Bilateral carotid disease: Continue aspirin 81 mg, atorvastatin  10 mg, bempedoic acid  100 mg, and strict blood pressure control.  I have personally reviewed the patients imaging data as summarized above.  I have reviewed the natural history of mitral regurgitation with the patient and family members who are present today. We have discussed the limitations of medical therapy and the poor prognosis associated with symptomatic mitral regurgitation. We have also reviewed potential treatment options, including palliative medical therapy, conventional mitral surgery, and transcatheter mitral edge-to-edge repair. We discussed treatment options in the context of this patient's specific comorbid medical conditions.   All of the patient's questions were answered today. Will make further recommendations based on the results of studies outlined above.   I spent 52 minutes reviewing all clinical data during and prior to this visit including all relevant imaging studies, laboratories, clinical information from other health systems and prior notes from both Cardiology and other specialties, interviewing the patient, conducting a complete physical examination, and coordinating care in order to formulate a comprehensive and personalized evaluation and treatment plan.   Avier Jech K Paisly Fingerhut, MD  11/13/2023  12:28 PM    [facility] [address] [place], [place], [place]  [address] Phone: [phone]; Fax: [phone]

## 2023-11-12 NOTE — Telephone Encounter (Signed)
 Rescheduled the patient to see Dr. Wendel tomorrow, 11/13/2023. She was grateful for call and agreed with plan.

## 2023-11-12 NOTE — Telephone Encounter (Signed)
 Per Rosaline Jetty secure chat message, Hi Dr. Wendel, this pt was new consult seen by me in April for elev CAC. She has a friend who sees you and would like to see you. Echo obtained due to murmur which revealed mod to severe MR and PET CT due to significant calc reveals ischemia in RCA region. she has SOB and fatigue with moderate exertion. can you please review her chart and let me know your thoughts on proceeding with LHC or TEE or would you like to see her in consult prior.   Discussed with Rosaline and Dr. Wendel, who will see her first to make plan.   Scheduled the patient for consult with Dr. Wendel 11/15/2023. She was grateful for call and agreed with plan.

## 2023-11-12 NOTE — H&P (View-Only) (Signed)
 Patient ID: [name] MRN: [id number] DOB/AGE: [birth year] 82 y.o.  Primary Care Physician:McNeill, Sari, MD Primary Cardiologist: Wendel Referring provider: Percy  CC:  Mitral valvular disease management     FOCUSED PROBLEM LIST:   Mitral regurgitation Moderate to severe, anteriorly directed EF 60-65% TTE May 2025 Coronary artery calcification CT Calcium  Score 2025 Large basal inferior and inferoseptal ischemia PET stress 2025 Hyperlipidemia LP(a) 157 Intolerant of high-dose statins Defers injectable medications On bempedoic acid  Aortic atherosclerosis Chest CT 2025 Carotid artery disease 1-49% bilateral stenosis on duplex 05/22/23 Emphysema Lung cancer (diagnosed 2014) S/p partial lung resection  Polymyalgia rheumatica Depression Generalized anxiety BMI 06 November 2023:  Patient consents to use of AI scribe. The patient is an 83 year old female referred by NP Swinyer for recommendations regarding the patient's mitral valvular disease in conjunction with an abnormal PET stress test.  She had reported exertional dyspnea but none at rest.  Her workup involved an echocardiogram which demonstrated moderate to severe mitral regurgitation which is anteriorly directed.  She also had an abnormal stress test as detailed above.  She experiences shortness of breath, particularly during exertion such as walking far or fast, which has progressively worsened over the past couple of years. She becomes breathless to the point of needing to sit down, especially when walking up hills. No shortness of breath at rest, but she does wake up in the middle of the night feeling short of breath, which resolves upon waking. She uses two pillows at night since breaking her ribs in October, which helps her breathing.   Her past medical history includes lung cancer, for which she underwent a partial lung resection of the right lower lobe approximately ten years ago. She continues to have annual  follow-ups with her  Dr Kerrin. She has a history of smoking, having quit nearly twenty years ago after smoking for a long time. She denies current smoking and is not diabetic.  She experiences lightheadedness and dizziness, described as 'dizzy weeks,' and occasionally takes meclizine , though it is not always effective. No recent episodes of feeling like she might pass out.  She lives alone, is divorced, and manages her daily activities independently, though she notes a lack of energy for cleaning, which she attributes to her shortness of breath. Family history is significant for heart valve issues among her children and grandchildren, and her mother had polymyalgia rheumatica. She has five sisters, none of whom are local, and they suspect they may also have heart valve issues.  She has a history of requiring major dental work but has had no issues recently.     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   Osteoporosis     Past Surgical History:  Procedure Laterality Date   ABDOMINAL HYSTERECTOMY     partial   APPENDECTOMY     BREAST EXCISIONAL BIOPSY Bilateral    benign   BREAST SURGERY Bilateral    bx    EYE SURGERY Bilateral    cataracts   LUNG REMOVAL, PARTIAL Right 01/26/2013   for Stage IA non small cell   TONSILLECTOMY     VIDEO ASSISTED THORACOSCOPY (VATS)/WEDGE RESECTION Right 01/26/2013   Procedure: VIDEO ASSISTED THORACOSCOPY (  VATS) with wedge resection right middle lobe nodule , segmental resection rigth lower lobe lung, lymph node sampling,;  Surgeon: Elspeth JAYSON Millers, MD;  Location: [facility] OR;  Service: Thoracic;  Laterality: Right;    Family History  Problem Relation Age of Onset   Lung cancer Father    Diabetes Mellitus II Father     Hypertension Father    Hyperlipidemia Father    Cancer - Other Brother     Social History   Socioeconomic History   Marital status: Divorced    Spouse name: Not on file   Number of children: Not on file   Years of education: Not on file   Highest education level: Not on file  Occupational History   Not on file  Tobacco Use   Smoking status: Former    Current packs/day: 0.00    Average packs/day: 1 pack/day for 40.0 years (40.0 ttl pk-yrs)    Types: Cigarettes    Start date: 04/16/1965    Quit date: 04/16/2005    Years since quitting: 18.5   Smokeless tobacco: Never  Substance and Sexual Activity   Alcohol  use: No   Drug use: No   Sexual activity: Not on file  Other Topics Concern   Not on file  Social History Narrative   Not on file   Social Drivers of Health   Financial Resource Strain: Not on file  Food Insecurity: Not on file  Transportation Needs: Not on file  Physical Activity: Not on file  Stress: Not on file  Social Connections: Not on file  Intimate Partner Violence: Not on file     Prior to Admission medications   Medication Sig Start Date End Date Taking? Authorizing Provider  ALPRAZolam (XANAX) 0.25 MG tablet As needed for flying 09/04/16   [provider]  amLODipine (NORVASC) 2.5 MG tablet Take 2.5 mg by mouth daily. 05/17/23   [provider]  aspirin 81 MG chewable tablet Chew 81 mg by mouth daily. 06/02/23   [provider]  atorvastatin  (LIPITOR) 10 MG tablet TAKE 1 TABLET (10 MG TOTAL) BY MOUTH 3 (THREE) TIMES A WEEK. TAKES M-W-F. 11/01/23   Swinyer, Rosaline HERO, NP  azelastine (ASTELIN) 0.1 % nasal spray Place 1 spray into both nostrils 2 (two) times daily. 10/24/23   [provider]  Bempedoic Acid  (NEXLETOL ) 180 MG TABS Take 1 tablet (180 mg total) by mouth daily. 11/11/23   Swinyer, Rosaline HERO, NP  Cholecalciferol  (VITAMIN D ) 2000 UNITS tablet Take 1,000 Units by mouth daily.     [provider]  denosumab   (PROLIA ) 60 MG/ML SOSY injection Inject 60 mg into the skin every 6 (six) months.    [provider]  escitalopram (LEXAPRO) 5 MG tablet Take 5 mg by mouth daily.    [provider]  famotidine (PEPCID AC) 10 MG tablet Take 10 mg by mouth daily.    [provider]  fluticasone (FLONASE ALLERGY RELIEF) 50 MCG/ACT nasal spray Place 1 spray into both nostrils daily. 04/25/18   [provider]  hydroxypropyl methylcellulose (ISOPTO TEARS) 2.5 % ophthalmic solution Place 1 drop into both eyes daily.    [provider]  linaclotide (LINZESS) 72 MCG capsule Take 72 mcg by mouth. Per patient alternate taking on days when stomach is not upset 01/26/19   [provider]  meclizine  (ANTIVERT ) 25 MG tablet Take 25 mg by mouth 3 (three) times daily as needed for dizziness.  12/24/12   [provider]  Melatonin 3 MG TBDP 1/2 Orally once a day if needed    [provider]  meloxicam  (MOBIC ) 15 MG tablet Take 15 mg by mouth daily as needed for pain.  12/22/12   [provider]  methocarbamol (ROBAXIN) 500 MG tablet as needed.    [provider]  Multiple Vitamin (MULTIVITAMIN WITH MINERALS) TABS tablet Take 1 tablet by mouth every other day.    [provider]  ofloxacin (OCUFLOX) 0.3 % ophthalmic solution as needed. 12/05/21   [provider]  Omega-3 Krill Oil 500 MG CAPS Take 1 capsule by mouth daily.    [provider]  Omeprazole Magnesium (PRILOSEC OTC PO) Take 1 tablet by mouth daily as needed (reflux).    [provider]    Allergies  Allergen Reactions   Bupropion Other (See Comments)   Doxycycline Other (See Comments)   Duloxetine Hcl Other (See Comments)   Erythromycin Other (See Comments)    Upset stomach   Escitalopram Other (See Comments)   Sertraline Other (See Comments)    REVIEW OF SYSTEMS:  General: no fevers/chills/night sweats Eyes: no blurry vision, diplopia,  or amaurosis ENT: no sore throat or hearing loss Resp: no cough, wheezing, or hemoptysis CV: no edema or palpitations GI: no abdominal pain, nausea, vomiting, diarrhea, or constipation GU: no dysuria, frequency, or hematuria Skin: no rash Neuro: no headache, numbness, tingling, or weakness of extremities Musculoskeletal: no joint pain or swelling Heme: no bleeding, DVT, or easy bruising Endo: no polydipsia or polyuria  BP 124/60   Pulse (!) 55   Ht 5' 6.5 (1.689 m)   Wt 151 lb 6.4 oz (68.7 kg)   SpO2 96%   BMI 24.07 kg/m   PHYSICAL EXAM: GEN:  AO x 3 in no acute distress HEENT: normal Dentition: Normal Neck: JVP normal. +2 carotid upstrokes with bruits bilaterally. No thyromegaly. Lungs: equal expansion, clear bilaterally CV: Apex is discrete and nondisplaced, RRR with 3 out of 6 holosystolic murmur best heard at axilla but present throughout the precordium Abd: soft, non-tender, non-distended; no bruit; positive bowel sounds Ext: no edema, ecchymoses, or cyanosis Vascular: 2+ femoral pulses, 2+ radial pulses       Skin: warm and dry without rash Neuro: CN II-XII grossly intact; motor and sensory grossly intact    DATA AND STUDIES:  EKG: 2025 normal sinus rhythm with nonspecific T wave abnormalities  EKG Interpretation Date/Time:    Ventricular Rate:    PR Interval:    QRS Duration:    QT Interval:    QTC Calculation:   R Axis:      Text Interpretation:          08/12/2023: ALT 19 11/11/2023: BUN 18; Creatinine, Ser 0.97; Hemoglobin 13.2; Platelets 193; Potassium 4.9; Sodium 142   STS RISK CALCULATOR: Pending  NHYA CLASS: 2     ASSESSMENT AND PLAN:   1. Nonrheumatic mitral valve regurgitation   2. Coronary artery calcification   3. Hyperlipidemia LDL goal <70   4. Aortic atherosclerosis (HCC)   5. Bilateral carotid artery stenosis     Mitral regurgitation: The patient has developed NYHA class II symptoms of shortness of breath and fatigue.  She  has severe mitral regurgitation that I suspect is likely degenerative.  I will refer the patient for coronary angiography, right heart catheterization, and a TEE.  Further recommendations will be issued once the TEE has been reviewed. Coronary artery calcification: She has an elevated coronary artery calcification score and  an abnormal stress test.  Will refer for coronary angiography as detailed above. Hyperlipidemia: Continue atorvastatin  10 mg and bempedoic acid  100 mg daily.  Check lipid panel and LFTs in the future. Aortic atherosclerosis: Continue aspirin 81 mg, atorvastatin  10 mg, and bempedoic acid  100 mg daily Bilateral carotid disease: Continue aspirin 81 mg, atorvastatin  10 mg, bempedoic acid  100 mg, and strict blood pressure control.  I have personally reviewed the patients imaging data as summarized above.  I have reviewed the natural history of mitral regurgitation with the patient and family members who are present today. We have discussed the limitations of medical therapy and the poor prognosis associated with symptomatic mitral regurgitation. We have also reviewed potential treatment options, including palliative medical therapy, conventional mitral surgery, and transcatheter mitral edge-to-edge repair. We discussed treatment options in the context of this patient's specific comorbid medical conditions.   All of the patient's questions were answered today. Will make further recommendations based on the results of studies outlined above.   I spent 52 minutes reviewing all clinical data during and prior to this visit including all relevant imaging studies, laboratories, clinical information from other health systems and prior notes from both Cardiology and other specialties, interviewing the patient, conducting a complete physical examination, and coordinating care in order to formulate a comprehensive and personalized evaluation and treatment plan.   Avier Jech K Paisly Fingerhut, MD  11/13/2023  12:28 PM    [facility] [address] [place], [place], [place]  [address] Phone: [phone]; Fax: [phone]

## 2023-11-12 NOTE — Telephone Encounter (Signed)
 Please follow-up to determine if she got a price for Nexletol  from [facility]. If the price is too high, I would recommend we add ezetimibe 10 mg daily and plan to recheck NMR and ALT in 2-3 months.

## 2023-11-13 ENCOUNTER — Ambulatory Visit: Attending: Cardiology | Admitting: Internal Medicine

## 2023-11-13 ENCOUNTER — Encounter: Payer: Self-pay | Admitting: Internal Medicine

## 2023-11-13 ENCOUNTER — Inpatient Hospital Stay (HOSPITAL_COMMUNITY): Admission: RE | Admit: 2023-11-13 | Source: Ambulatory Visit

## 2023-11-13 VITALS — BP 124/60 | HR 55 | Ht 66.5 in | Wt 151.4 lb

## 2023-11-13 DIAGNOSIS — I340 Nonrheumatic mitral (valve) insufficiency: Secondary | ICD-10-CM | POA: Diagnosis not present

## 2023-11-13 DIAGNOSIS — E785 Hyperlipidemia, unspecified: Secondary | ICD-10-CM | POA: Insufficient documentation

## 2023-11-13 DIAGNOSIS — I6523 Occlusion and stenosis of bilateral carotid arteries: Secondary | ICD-10-CM | POA: Diagnosis not present

## 2023-11-13 DIAGNOSIS — I700 Atherosclerosis of aorta: Secondary | ICD-10-CM | POA: Insufficient documentation

## 2023-11-13 DIAGNOSIS — I2510 Atherosclerotic heart disease of native coronary artery without angina pectoris: Secondary | ICD-10-CM | POA: Diagnosis not present

## 2023-11-13 NOTE — Patient Instructions (Signed)
 Medication Instructions:  No changes *If you need a refill on your cardiac medications before your next appointment, please call your pharmacy*  Lab Work: None ordered If you have labs (blood work) drawn today and your tests are completely normal, you will receive your results only by: MyChart Message (if you have MyChart) OR A paper copy in the mail If you have any lab test that is abnormal or we need to change your treatment, we will call you to review the results.  Testing/Procedures:    Dear [name]  You are scheduled for a TEE (Transesophageal Echocardiogram) on Friday, August 8 with Dr. Delford.  Please arrive at the [facility] (Main Entrance A) at [facility]: [address] [place], [place] [address] at 10:00 AM (This time is 1 hour(s) before your procedure to ensure your preparation).   Free valet parking service is available. You will check in at ADMITTING.   *Please Note: You will receive a call the day before your procedure to confirm the appointment time. That time may have changed from the original time based on the schedule for that day.*    DIET:  Nothing to eat or drink after midnight except a sip of water with medications (see medication instructions below)  MEDICATION INSTRUCTIONS: !!IF ANY NEW MEDICATIONS ARE STARTED AFTER TODAY, PLEASE NOTIFY YOUR PROVIDER AS SOON AS POSSIBLE!!  FYI: Medications such as Semaglutide (Ozempic, [place]), Tirzepatide (Mounjaro, Zepbound), Dulaglutide (Trulicity), etc (GLP1 agonists) AND Canagliflozin (Invokana), Dapagliflozin (Farxiga), Empagliflozin (Jardiance), Ertugliflozin (Steglatro), Bexagliflozin [employer]) or any combination with one of these drugs such as Invokamet (Canagliflozin/Metformin), Synjardy (Empagliflozin/Metformin), etc (SGLT2 inhibitors) must be held around the time of a procedure. This is not a comprehensive list of all of these drugs. Please review all of your medications and talk to your provider  if you take any one of these. If you are not sure, ask your provider.  You do not need to hold any medications. May take with sips of water.   LABS: No labs needed at this time   FYI:  For your safety, and to allow us  to monitor your vital signs accurately during the surgery/procedure we request: If you have artificial nails, gel coating, SNS etc, please have those removed prior to your surgery/procedure. Not having the nail coverings /polish removed may result in cancellation or delay of your surgery/procedure.  Your support person will be asked to wait in the waiting room during your procedure.  It is OK to have someone drop you off and come back when you are ready to be discharged.  You cannot drive after the procedure and will need someone to drive you home.  Bring your insurance cards.  *Special Note: Every effort is made to have your procedure done on time. Occasionally there are emergencies that occur at the hospital that may cause delays. Please be patient if a delay does occur.    [facility] A DEPT OF [facility] AT [address] [facility] DEPT OF THE . [facility] [address] [place] [address] Dept: [phone] Loc: [phone]  [name]  11/13/2023  You are scheduled for a Cardiac Catheterization on Friday, August 8 with Dr. Lurena Red.  1. Please arrive at the [facility] (Main Entrance A) at [facility]: [address] [place], [place] [address] at 10:00 AM (This time is 2 hour(s) before your procedure to ensure your preparation).   Free valet parking  service is available. You will check in at ADMITTING. The support person will be asked to wait in the waiting room.  It is OK to have someone drop you off and come back when you are ready to be discharged.    Special note: Every effort is made to have your procedure done on time. Please understand that emergencies sometimes delay scheduled procedures.  2. Diet:  NPO: Nothing to eat or drink after midnight   3. Hydration: NPO: Nothing to eat and drink after midnight. (For TEE and Cath the same day) (List of approved liquids water, clear juice, clear tea, black coffee, fruit juices, non-citric and without pulp, carbonated beverages, Gatorade, Kool -Aid, plain Jello-O and plain ice popsicles)  4. Labs: No labs needed at this time  5. Medication instructions in preparation for your procedure:   Contrast Allergy: No  On the morning of your procedure, take your Aspirin 81 mg and any morning medicines NOT listed above.  You may use sips of water.  6. Plan to go home the same day, you will only stay overnight if medically necessary. 7. Bring a current list of your medications and current insurance cards. 8. You MUST have a responsible person to drive you home. 9. Someone MUST be with you the first 24 hours after you arrive home or your discharge will be delayed. 10. Please wear clothes that are easy to get on and off and wear slip-on shoes.  Thank you for allowing us  to care for you!   -- [facility]      Follow-Up: At [facility], you and your health needs are our priority.  As part of our continuing mission to provide you with exceptional heart care, our providers are all part of one team.  This team includes your primary Cardiologist (physician) and Advanced Practice Providers or APPs (Physician Assistants and Nurse Practitioners) who all work together to provide you with the care you need, when you need it.  Your next appointment:   Follow up will be determined after procedures  We recommend signing up for the patient portal called MyChart.  Sign up information is provided on this After Visit Summary.  MyChart is used to connect with patients for Virtual Visits (Telemedicine).  Patients are able to view lab/test results, encounter notes, upcoming appointments, etc.  Non-urgent messages can be sent to  your provider as well.   To learn more about what you can do with MyChart, go to [email or web address].

## 2023-11-13 NOTE — Telephone Encounter (Signed)
 S/w pt nexlitol is in route. Pt will send a [facility] message when pt gets the medication. Discuss next plan and cost.

## 2023-11-15 ENCOUNTER — Ambulatory Visit: Admitting: Internal Medicine

## 2023-11-15 ENCOUNTER — Encounter (HOSPITAL_BASED_OUTPATIENT_CLINIC_OR_DEPARTMENT_OTHER): Payer: Self-pay

## 2023-11-21 ENCOUNTER — Telehealth: Payer: Self-pay | Admitting: *Deleted

## 2023-11-21 NOTE — Telephone Encounter (Signed)
 Cardiac Catheterization scheduled at [facility] for: Friday November 22, 2023 12 Noon/11 AM Arrival time [facility] Main Entrance A at: 10 AM  Diet: Nothing to eat or drink after midnight.  Medication instructions: -Hold:  Mobic -until post procedure-per protocol GFR < 60 (59) Other usual morning medications can be taken with sips of water   including aspirin  81 mg.  Plan to go home the same day, you will only stay overnight if medically necessary.  You must have responsible adult to drive you home.  Someone must be with you the first 24 hours after you arrive home.  Reviewed procedure instructions with patient.

## 2023-11-22 ENCOUNTER — Encounter (HOSPITAL_COMMUNITY)
Admission: RE | Disposition: A | Discharge status: Home / Self Care | Payer: Self-pay | Source: Home / Self Care | Attending: Cardiovascular Disease

## 2023-11-22 ENCOUNTER — Ambulatory Visit (HOSPITAL_COMMUNITY): Admitting: Anesthesiology

## 2023-11-22 ENCOUNTER — Other Ambulatory Visit: Payer: Self-pay

## 2023-11-22 ENCOUNTER — Ambulatory Visit (HOSPITAL_COMMUNITY)
Admission: RE | Admit: 2023-11-22 | Discharge: 2023-11-22 | Disposition: A | Discharge status: Home / Self Care | Attending: Cardiovascular Disease | Admitting: Cardiovascular Disease

## 2023-11-22 ENCOUNTER — Ambulatory Visit (HOSPITAL_COMMUNITY)

## 2023-11-22 ENCOUNTER — Encounter (HOSPITAL_COMMUNITY): Payer: Self-pay | Admitting: Cardiovascular Disease

## 2023-11-22 ENCOUNTER — Other Ambulatory Visit (HOSPITAL_COMMUNITY): Payer: Self-pay

## 2023-11-22 DIAGNOSIS — I340 Nonrheumatic mitral (valve) insufficiency: Secondary | ICD-10-CM

## 2023-11-22 DIAGNOSIS — F411 Generalized anxiety disorder: Secondary | ICD-10-CM | POA: Diagnosis not present

## 2023-11-22 DIAGNOSIS — I083 Combined rheumatic disorders of mitral, aortic and tricuspid valves: Secondary | ICD-10-CM | POA: Insufficient documentation

## 2023-11-22 DIAGNOSIS — Z7982 Long term (current) use of aspirin: Secondary | ICD-10-CM | POA: Diagnosis not present

## 2023-11-22 DIAGNOSIS — J439 Emphysema, unspecified: Secondary | ICD-10-CM | POA: Diagnosis not present

## 2023-11-22 DIAGNOSIS — E7800 Pure hypercholesterolemia, unspecified: Secondary | ICD-10-CM | POA: Diagnosis not present

## 2023-11-22 DIAGNOSIS — I071 Rheumatic tricuspid insufficiency: Secondary | ICD-10-CM | POA: Insufficient documentation

## 2023-11-22 DIAGNOSIS — I2510 Atherosclerotic heart disease of native coronary artery without angina pectoris: Secondary | ICD-10-CM

## 2023-11-22 DIAGNOSIS — Z87891 Personal history of nicotine dependence: Secondary | ICD-10-CM | POA: Diagnosis not present

## 2023-11-22 DIAGNOSIS — I6523 Occlusion and stenosis of bilateral carotid arteries: Secondary | ICD-10-CM | POA: Insufficient documentation

## 2023-11-22 DIAGNOSIS — E785 Hyperlipidemia, unspecified: Secondary | ICD-10-CM | POA: Diagnosis present

## 2023-11-22 DIAGNOSIS — Z79899 Other long term (current) drug therapy: Secondary | ICD-10-CM | POA: Diagnosis not present

## 2023-11-22 DIAGNOSIS — I10 Essential (primary) hypertension: Secondary | ICD-10-CM | POA: Insufficient documentation

## 2023-11-22 DIAGNOSIS — Z955 Presence of coronary angioplasty implant and graft: Secondary | ICD-10-CM

## 2023-11-22 DIAGNOSIS — M353 Polymyalgia rheumatica: Secondary | ICD-10-CM | POA: Insufficient documentation

## 2023-11-22 DIAGNOSIS — I700 Atherosclerosis of aorta: Secondary | ICD-10-CM | POA: Insufficient documentation

## 2023-11-22 DIAGNOSIS — Z85118 Personal history of other malignant neoplasm of bronchus and lung: Secondary | ICD-10-CM | POA: Diagnosis not present

## 2023-11-22 DIAGNOSIS — F32A Depression, unspecified: Secondary | ICD-10-CM | POA: Diagnosis not present

## 2023-11-22 HISTORY — PX: TRANSESOPHAGEAL ECHOCARDIOGRAM (CATH LAB): EP1270

## 2023-11-22 HISTORY — PX: RIGHT/LEFT HEART CATH AND CORONARY ANGIOGRAPHY: CATH118266

## 2023-11-22 HISTORY — PX: CORONARY STENT INTERVENTION: CATH118234

## 2023-11-22 LAB — POCT ACTIVATED CLOTTING TIME
Activated Clotting Time: 302 s
Activated Clotting Time: 308 s
Activated Clotting Time: 216 s

## 2023-11-22 LAB — POCT I-STAT EG7
Acid-Base Excess: 0 mmol/L (ref 0.0–2.0)
Acid-base deficit: 1 mmol/L (ref 0.0–2.0)
Bicarbonate: 25.4 mmol/L (ref 20.0–28.0)
Bicarbonate: 27 mmol/L (ref 20.0–28.0)
Calcium, Ion: 1.09 mmol/L — ABNORMAL LOW (ref 1.15–1.40)
Calcium, Ion: 1.18 mmol/L (ref 1.15–1.40)
HCT: 32 % — ABNORMAL LOW (ref 36.0–46.0)
HCT: 34 % — ABNORMAL LOW (ref 36.0–46.0)
Hemoglobin: 10.9 g/dL — ABNORMAL LOW (ref 12.0–15.0)
Hemoglobin: 11.6 g/dL — ABNORMAL LOW (ref 12.0–15.0)
O2 Saturation: 60 %
O2 Saturation: 70 %
Potassium: 4.2 mmol/L (ref 3.5–5.1)
Potassium: 4.5 mmol/L (ref 3.5–5.1)
Sodium: 141 mmol/L (ref 135–145)
Sodium: 143 mmol/L (ref 135–145)
TCO2: 27 mmol/L (ref 22–32)
TCO2: 29 mmol/L (ref 22–32)
pCO2, Ven: 49.1 mmHg (ref 44–60)
pCO2, Ven: 52.3 mmHg (ref 44–60)
pH, Ven: 7.322 (ref 7.25–7.43)
pH, Ven: 7.322 (ref 7.25–7.43)
pO2, Ven: 34 mmHg (ref 32–45)
pO2, Ven: 40 mmHg (ref 32–45)

## 2023-11-22 LAB — POCT I-STAT 7, (LYTES, BLD GAS, ICA,H+H)
Acid-base deficit: 2 mmol/L (ref 0.0–2.0)
Bicarbonate: 23.1 mmol/L (ref 20.0–28.0)
Calcium, Ion: 1.15 mmol/L (ref 1.15–1.40)
HCT: 33 % — ABNORMAL LOW (ref 36.0–46.0)
Hemoglobin: 11.2 g/dL — ABNORMAL LOW (ref 12.0–15.0)
O2 Saturation: 93 %
Potassium: 4.4 mmol/L (ref 3.5–5.1)
Sodium: 141 mmol/L (ref 135–145)
TCO2: 24 mmol/L (ref 22–32)
pCO2 arterial: 41.2 mmHg (ref 32–48)
pH, Arterial: 7.357 (ref 7.35–7.45)
pO2, Arterial: 69 mmHg — ABNORMAL LOW (ref 83–108)

## 2023-11-22 LAB — ECHO TEE

## 2023-11-22 SURGERY — TRANSESOPHAGEAL ECHOCARDIOGRAM (TEE) (CATHLAB)
Anesthesia: Monitor Anesthesia Care

## 2023-11-22 SURGERY — RIGHT/LEFT HEART CATH AND CORONARY ANGIOGRAPHY
Anesthesia: LOCAL

## 2023-11-22 MED ORDER — PROPOFOL 500 MG/50ML IV EMUL
INTRAVENOUS | Status: DC | PRN
  Administered 2023-11-22: 100 ug/kg/min via INTRAVENOUS

## 2023-11-22 MED ORDER — CLOPIDOGREL BISULFATE 300 MG PO TABS
ORAL_TABLET | ORAL | Status: AC
  Filled 2023-11-22: qty 1

## 2023-11-22 MED ORDER — ACETAMINOPHEN 325 MG PO TABS
650.0000 mg | ORAL_TABLET | ORAL | Status: DC | PRN
Start: 2023-11-22 — End: 2023-11-23

## 2023-11-22 MED ORDER — NITROGLYCERIN 0.4 MG SL SUBL
0.4000 mg | SUBLINGUAL_TABLET | SUBLINGUAL | 2 refills | Status: DC | PRN
  Filled 2023-11-22: qty 25, 5d supply, fill #0

## 2023-11-22 MED ORDER — ONDANSETRON HCL 4 MG/2ML IJ SOLN: 4.0000 mg | Freq: Four times a day (QID) | INTRAMUSCULAR | Status: DC | PRN

## 2023-11-22 MED ORDER — ASPIRIN 81 MG PO CHEW: 81.0000 mg | CHEWABLE_TABLET | Freq: Every day | ORAL | Status: DC

## 2023-11-22 MED ORDER — HEPARIN SODIUM (PORCINE) 1000 UNIT/ML IJ SOLN
INTRAMUSCULAR | Status: AC
  Filled 2023-11-22: qty 10

## 2023-11-22 MED ORDER — ONDANSETRON HCL 4 MG/2ML IJ SOLN
INTRAMUSCULAR | Status: DC | PRN
  Administered 2023-11-22: 4 mg via INTRAVENOUS

## 2023-11-22 MED ORDER — HEPARIN (PORCINE) IN NACL 1000-0.9 UT/500ML-% IV SOLN
INTRAVENOUS | Status: DC | PRN
  Administered 2023-11-22 (×3): 500 mL

## 2023-11-22 MED ORDER — CLOPIDOGREL BISULFATE 300 MG PO TABS
ORAL_TABLET | ORAL | Status: DC | PRN
  Administered 2023-11-22: 300 mg via ORAL

## 2023-11-22 MED ORDER — LIDOCAINE HCL (PF) 1 % IJ SOLN
INTRAMUSCULAR | Status: AC
  Filled 2023-11-22: qty 30

## 2023-11-22 MED ORDER — ASPIRIN 81 MG PO TBEC
81.0000 mg | DELAYED_RELEASE_TABLET | Freq: Every day | ORAL | 0 refills | Status: DC
  Filled 2023-11-22: qty 30, 30d supply, fill #0

## 2023-11-22 MED ORDER — PROPOFOL 10 MG/ML IV BOLUS
INTRAVENOUS | Status: DC | PRN
  Administered 2023-11-22: 75 mg via INTRAVENOUS
  Administered 2023-11-22: 30 mg via INTRAVENOUS

## 2023-11-22 MED ORDER — FENTANYL CITRATE (PF) 100 MCG/2ML IJ SOLN
INTRAMUSCULAR | Status: AC
  Filled 2023-11-22: qty 2

## 2023-11-22 MED ORDER — SODIUM CHLORIDE 0.9% FLUSH: 3.0000 mL | Freq: Two times a day (BID) | INTRAVENOUS | Status: DC

## 2023-11-22 MED ORDER — ASPIRIN 81 MG PO CHEW: 81.0000 mg | CHEWABLE_TABLET | ORAL | Status: DC

## 2023-11-22 MED ORDER — VERAPAMIL HCL 2.5 MG/ML IV SOLN
INTRAVENOUS | Status: AC
  Filled 2023-11-22: qty 2

## 2023-11-22 MED ORDER — PHENYLEPHRINE HCL-NACL 20-0.9 MG/250ML-% IV SOLN
INTRAVENOUS | Status: DC | PRN
  Administered 2023-11-22: 20 ug/min via INTRAVENOUS

## 2023-11-22 MED ORDER — HEPARIN SODIUM (PORCINE) 1000 UNIT/ML IJ SOLN
INTRAMUSCULAR | Status: DC | PRN
  Administered 2023-11-22: 3000 [IU] via INTRAVENOUS
  Administered 2023-11-22: 5000 [IU] via INTRAVENOUS
  Administered 2023-11-22: 6000 [IU] via INTRAVENOUS

## 2023-11-22 MED ORDER — SODIUM CHLORIDE 0.9 % IV SOLN: 250.0000 mL | INTRAVENOUS | Status: DC | PRN

## 2023-11-22 MED ORDER — SODIUM CHLORIDE 0.9 % IV SOLN: INTRAVENOUS | Status: DC

## 2023-11-22 MED ORDER — CLOPIDOGREL BISULFATE 75 MG PO TABS: 75.0000 mg | ORAL_TABLET | Freq: Every day | ORAL | Status: DC

## 2023-11-22 MED ORDER — SODIUM CHLORIDE 0.9% FLUSH
3.0000 mL | INTRAVENOUS | Status: DC | PRN
Start: 2023-11-22 — End: 2023-11-23

## 2023-11-22 MED ORDER — FREE WATER: 500.0000 mL | Freq: Once | Status: DC

## 2023-11-22 MED ORDER — LIDOCAINE HCL (PF) 1 % IJ SOLN
INTRAMUSCULAR | Status: DC | PRN
  Administered 2023-11-22 (×2): 2 mL

## 2023-11-22 MED ORDER — MIDAZOLAM HCL 2 MG/2ML IJ SOLN
INTRAMUSCULAR | Status: DC | PRN
  Administered 2023-11-22 (×2): 1 mg via INTRAVENOUS

## 2023-11-22 MED ORDER — LIDOCAINE 2% (20 MG/ML) 5 ML SYRINGE
INTRAMUSCULAR | Status: DC | PRN
  Administered 2023-11-22: 60 mg via INTRAVENOUS

## 2023-11-22 MED ORDER — VERAPAMIL HCL 2.5 MG/ML IV SOLN
INTRAVENOUS | Status: DC | PRN
  Administered 2023-11-22: 10 mL via INTRA_ARTERIAL

## 2023-11-22 MED ORDER — LABETALOL HCL 5 MG/ML IV SOLN
10.0000 mg | INTRAVENOUS | Status: AC | PRN
Start: 2023-11-22 — End: 2023-11-22

## 2023-11-22 MED ORDER — FENTANYL CITRATE (PF) 100 MCG/2ML IJ SOLN
INTRAMUSCULAR | Status: DC | PRN
  Administered 2023-11-22 (×2): 25 ug via INTRAVENOUS

## 2023-11-22 MED ORDER — CLOPIDOGREL BISULFATE 75 MG PO TABS
75.0000 mg | ORAL_TABLET | Freq: Every day | ORAL | 0 refills | Status: DC
  Filled 2023-11-22: qty 30, 30d supply, fill #0

## 2023-11-22 MED ORDER — IOHEXOL 350 MG/ML SOLN
INTRAVENOUS | Status: DC | PRN
  Administered 2023-11-22: 140 mL

## 2023-11-22 MED ORDER — MIDAZOLAM HCL 2 MG/2ML IJ SOLN
INTRAMUSCULAR | Status: AC
  Filled 2023-11-22: qty 2

## 2023-11-22 MED ORDER — PHENYLEPHRINE 80 MCG/ML (10ML) SYRINGE FOR IV PUSH (FOR BLOOD PRESSURE SUPPORT)
PREFILLED_SYRINGE | INTRAVENOUS | Status: DC | PRN
  Administered 2023-11-22 (×2): 160 ug via INTRAVENOUS

## 2023-11-22 MED ORDER — HYDRALAZINE HCL 20 MG/ML IJ SOLN
10.0000 mg | INTRAMUSCULAR | Status: AC | PRN
Start: 2023-11-22 — End: 2023-11-22

## 2023-11-22 MED ORDER — FUROSEMIDE 20 MG PO TABS
20.0000 mg | ORAL_TABLET | Freq: Two times a day (BID) | ORAL | 2 refills | Status: DC
  Filled 2023-11-22: qty 60, 30d supply, fill #0

## 2023-11-22 SURGICAL SUPPLY — 24 items
BALLOON EMERGE MR 2.5X15 (BALLOONS) IMPLANT
BALLOON SAPPHIRE NC24 3.0X15 (BALLOONS) IMPLANT
BALLOON SAPPHIRE NC24 3.0X22 (BALLOONS) IMPLANT
BALLOON SAPPHIRE NC24 3.25X12 (BALLOONS) IMPLANT
BALLOON ~~LOC~~ EMERGE MR 2.5X12 (BALLOONS) IMPLANT
BALLOON ~~LOC~~ EMERGE MR 3.25X15 (BALLOONS) IMPLANT
CATH BALLN WEDGE 5F 110CM (CATHETERS) IMPLANT
CATH GUIDELINER COAST (CATHETERS) IMPLANT
CATH INFINITI 5FR ANG PIGTAIL (CATHETERS) IMPLANT
CATH INFINITI AMBI 6FR TG (CATHETERS) IMPLANT
CATH VISTA GUIDE 6FR JR4 ECOPK (CATHETERS) IMPLANT
DEVICE RAD COMP TR BAND LRG (VASCULAR PRODUCTS) IMPLANT
GLIDESHEATH SLEND SS 6F .021 (SHEATH) IMPLANT
GUIDEWIRE VAS SION BLUE 190 (WIRE) IMPLANT
KIT ENCORE 26 ADVANTAGE (KITS) IMPLANT
KIT HEMO VALVE WATCHDOG (MISCELLANEOUS) IMPLANT
PACK CARDIAC CATHETERIZATION (CUSTOM PROCEDURE TRAY) ×2 IMPLANT
SET ATX-X65L (MISCELLANEOUS) IMPLANT
SHEATH GLIDE SLENDER 4/5FR (SHEATH) IMPLANT
STENT SYNERGY XD 2.50X32 (Permanent Stent) IMPLANT
STENT SYNERGY XD 3.0X24 (Permanent Stent) IMPLANT
STENT SYNERGY XD 3.0X32 (Permanent Stent) IMPLANT
WIRE EMERALD 3MM-J .035X260CM (WIRE) IMPLANT
WIRE RUNTHROUGH IZANAI 014 180 (WIRE) IMPLANT

## 2023-11-22 NOTE — Interval H&P Note (Signed)
 History and Physical Interval Note:  11/22/2023 10:21 AM  [name]  has presented today for surgery, with the diagnosis of mitral regurtation.  The various methods of treatment have been discussed with the patient and family. After consideration of risks, benefits and other options for treatment, the patient has consented to  Procedure(s): TRANSESOPHAGEAL ECHOCARDIOGRAM (N/A) as a surgical intervention.  The patient's history has been reviewed, patient examined, no change in status, stable for surgery.  I have reviewed the patient's chart and labs.  Questions were answered to the patient's satisfaction.     Maude Emmer

## 2023-11-22 NOTE — Discharge Summary (Addendum)
 Discharge Summary for Same Day PCI   Patient ID: [name] MRN: [id number]; DOB: 1940-09-02  Admit date: 11/22/2023 Discharge date: 11/22/2023  Primary Care Provider: Aisha Harvey, MD  Primary Cardiologist: None  Primary Electrophysiologist:  None   Discharge Diagnoses    Principal Problem:   CAD (coronary artery disease) Active Problems:   Hyperlipidemia   Hypertension   Severe mitral regurgitation   Tricuspid regurgitation    Diagnostic Studies/Procedures    TEE 11/22/2023: Impressions: 1. Left ventricular ejection fraction, by estimation, is 55 to 60%. The  left ventricle has normal function. The left ventricle has no regional  wall motion abnormalities.   2. Right ventricular systolic function is normal. The right ventricular  size is normal.   3. Left atrial size was severely dilated. No left atrial/left atrial  appendage thrombus was detected.   4. Right atrial size was moderately dilated.   5. Severe anteriorly directed MR. Flail segment of the P3 scallop  Regurgitant volume 80 cc RF 59%. Posterior leaflet length 15.6 mm. Mean  diatolic radient only 3 mmHg with MVA 6.5 cm2. . The mitral valve is  myxomatous. Severe mitral valve regurgitation.   No evidence of mitral stenosis.   6. 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   7. The aortic valve is tricuspid. There is mild calcification of the  aortic valve. There is mild thickening of the aortic valve. Aortic valve  regurgitation is mild. Aortic valve sclerosis is present, with no evidence  of aortic valve stenosis.   8. The inferior vena cava is normal in size with greater than 50%  respiratory variability, suggesting right atrial pressure of 3 mmHg.   9. 3D performed of the mitral valve and 3D performed of the tricuspid  valve and demonstrates Abnormal 3D imaging with flail P3 scallop MV and  bileaflet prolapse  TV.   Conclusion(s)/Recommendation(s): Normal biventricular function without  evidence of hemodynamically significant valvular heart disease.  _______________  Cardiac Catheterization 11/22/2023:   Prox RCA lesion is 90% stenosed.   Mid RCA lesion is 80% stenosed.   Ost RCA to Prox RCA lesion is 70% stenosed.   A stent was successfully placed.   A stent was successfully placed.   A stent was successfully placed.   Post intervention, there is a 0% residual stenosis.   Post intervention, there is a 0% residual stenosis.   Post intervention, there is a 0% residual stenosis.   1.  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  Due to high risk stress test, complex PCI of the right coronary artery was pursued requiring GuideLiner, multiple wires, and the implantation of 3 drug-eluting stents placed from the ostium to the distal RCA. 2.  Mild, nonobstructive disease of the LAD and left circumflex. 3.  Fick cardiac output of 5.76 L/min and Fick cardiac index of 3.22 L/min/m with the following hemodynamics:            Right atrial pressure mean of 8 mmHg with C V waves to 12 mmHg            RV 41/5 with end-diastolic pressure 12 mmHg            Wedge pressure mean of 18 mmHg with V waves to 28 mmHg            PA pressure 40/16 with a mean of 27 mmHg  PVR of 1.6 Woods units            PA pulsatility index of 3 4.  LVEDP of 27 mmHg   Recommendation: Dual antiplatelet therapy with aspirin  and Plavix  for 6 months followed by Plavix  monotherapy indefinitely.  Start Lasix  20 mg twice daily.  Same-day discharge for PCI after 6 hours of bedrest/monitoring.  Diagnostic Dominance: Right  Intervention     _____________   History of Present Illness     [name] is a 83 y.o. female with a history of CAD with an elevated coronary calcium  score in 05/2023 and abnormal cardiac PET stress test in 10/2023, severe mitral regurgitation, mild bilateral carotid stenosis, emphysema,  hypertension, hyperlipidemia, polymyalgia rheumatica, and anxiety/ depression.  Patient was recently referred to cardiology in 07/2023 for evaluation of an elevated coronary calcium  score that PCP ordered an 05/2023.  CAC score was 716 at that time placing her in the 85th percentile for sex and age.  She denied any chest pain at that time but did report some shortness of breath she was walking fast and feeling stressed.  Systolic murmur was also noted on exam.  Echo and cardiac PET stress test was ordered for further evaluation.  Echo showed LVEF of 60-65%, normal RV size and function, and moderate to severe MR.  Cardiac PET stress test showed a large reversible defect in the mid to basal inferior and inferior septal locations concerning for ischemia.  She was seen by Dr. Wendel on 11/13/2023 and TEE and right/left cardiac catheterization were arranged for further evaluation of abnormal stress test and mitral regurgitation.  Hospital Course     Patient presented to [facility] on 11/22/2023 for planned TEE and cardiac catheterization.  TEE showed LVEF of 55-60%, normal RV size and function, severely dilated left atrium, moderately dilated right atrium, no segment of the P3 leaflet of mitral valve with severe anteriorly directed MR, and prolapse of the septal and anterior leaflet of the tricuspid valve with moderate to to severe TR.  R/LHC showed severe single-vessel CAD with 70% stenosis of ostial to proximal RCA, 90% stenosis of proximal RCA, and 80% stenosis of RCA with otherwise only minimal disease.  She underwent successful PCI with overlapping DES x 3 to the ostial to distal RCA.  LVEDP was elevated at 27 mmHg.  She was started on DAPT with aspirin  81 mg daily and Plavix  75 mg daily with plans to continue this for 6 months followed by Plavix  monotherapy indefinitely.  She was also started on Lasix  20 mg twice daily. The patient was seen by Cardiac Rehab while in short stay. There were no observed  complications post cath. Right radial cath site was re-evaluated prior to discharge - she is significant ecchymosis of forearm. However, arm is soft and she has good radial pulse, grip strength, and sensation. Instructions/precautions regarding cath site care were given prior to discharge.  [name] was seen by Dr. Wendel and determined stable for discharge home. Follow up with our office has been arranged. Medications are listed below.   Pertinent changes include addition of Plavix  and Lasix  as above. Also provided a prescription of PRN sublingual Nitroglycerin  and educated patient on how to take this. Of note, patient is intolerant to higher doses of statins and is only able to tolerate Lipitor 10mg  three times weekly. LDL 95 in 07/2023. However, she was recently started on Nexletol  last week so did not make any additional changes. Can continue to follow  labs as an outpatient. Recommend repeat BMET at follow-up visit after starting Lasix . Patient does have PRN Meloxicam  listed under her medication list but she states she only uses this sparingly. Explained that we recommended avoiding use of NSAIDs given underlying heart disease and need for DAPT and using Tylenol  as needed for pain control.  _____________  Cath/PCI Registry Performance & Quality Measures: Aspirin  prescribed? - Yes ADP Receptor Inhibitor (Plavix /Clopidogrel , Brilinta/Ticagrelor or Effient/Prasugrel) prescribed (includes medically managed patients)? - Yes High Intensity Statin (Lipitor 40-80mg  or Crestor 20-40mg ) prescribed? - No - intolerant to higher dose statins (only on Lipitor 10mg  three times weekly) For EF <40%, was ACEI/ARB prescribed? - Not Applicable (EF >/= 40%) For EF <40%, Aldosterone Antagonist (Spironolactone or Eplerenone) prescribed? - Not Applicable (EF >/= 40%) Cardiac Rehab Phase II ordered (Included Medically managed Patients)? - Yes  _____________   Discharge Vitals Blood pressure (!) 158/60, pulse  68, temperature 98.5 F (36.9 C), temperature source Tympanic, resp. rate 18, SpO2 93%.  There were no vitals filed for this visit.  Last Labs & Radiologic Studies    CBC No results for input(s): WBC, NEUTROABS, HGB, HCT, MCV, PLT in the last 72 hours. Basic Metabolic Panel No results for input(s): NA, K, CL, CO2, GLUCOSE, BUN, CREATININE, CALCIUM , MG, PHOS in the last 72 hours. Liver Function Tests No results for input(s): AST, ALT, ALKPHOS, BILITOT, PROT, ALBUMIN  in the last 72 hours. No results for input(s): LIPASE, AMYLASE in the last 72 hours. High Sensitivity Troponin:   No results for input(s): TROPONINIHS in the last 720 hours.  BNP Invalid input(s): POCBNP D-Dimer No results for input(s): DDIMER in the last 72 hours. Hemoglobin A1C No results for input(s): HGBA1C in the last 72 hours. Fasting Lipid Panel No results for input(s): CHOL, HDL, LDLCALC, TRIG, CHOLHDL, LDLDIRECT in the last 72 hours. Thyroid  Function Tests No results for input(s): TSH, T4TOTAL, T3FREE, THYROIDAB in the last 72 hours.  Invalid input(s): FREET3 _____________  CARDIAC CATHETERIZATION Result Date: 11/22/2023   Prox RCA lesion is 90% stenosed.   Mid RCA lesion is 80% stenosed.   Ost RCA to Prox RCA lesion is 70% stenosed.   A stent was successfully placed.   A stent was successfully placed.   A stent was successfully placed.   Post intervention, there is a 0% residual stenosis.   Post intervention, there is a 0% residual stenosis.   Post intervention, there is a 0% residual stenosis. 1.  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  Due to high risk stress test, complex PCI of the right coronary artery was pursued requiring GuideLiner, multiple wires, and the implantation of 3 drug-eluting stents placed from the ostium to the distal RCA. 2.  Mild, nonobstructive disease of the LAD and left circumflex. 3.  Fick cardiac  output of 5.76 L/min and Fick cardiac index of 3.22 L/min/m with the following hemodynamics:  Right atrial pressure mean of 8 mmHg with C V waves to 12 mmHg  RV 41/5 with end-diastolic pressure 12 mmHg  Wedge pressure mean of 18 mmHg with V waves to 28 mmHg  PA pressure 40/16 with a mean of 27 mmHg  PVR of 1.6 Woods units  PA pulsatility index of 3 4.  LVEDP of 27 mmHg Recommendation: Dual antiplatelet therapy with aspirin  and Plavix  for 6 months followed by Plavix  monotherapy indefinitely.  Start Lasix  20 mg twice daily.  Same-day discharge for PCI after 6 hours of bedrest/monitoring.   ECHO TEE Result Date:  11/22/2023    TRANSESOPHOGEAL ECHO REPORT   Patient Name:   [name] Date of Exam: 11/22/2023 Medical Rec #:  [id number]      Height:       66.5 in Accession #:    [id number]     Weight:       151.4 lb Date of Birth:  [birth year]      BSA:          1.787 m Patient Age:    83 years       BP:           129/59 mmHg Patient Gender: F              HR:           75 bpm. Exam Location:  Inpatient Procedure: 3D Echo, Transesophageal Echo, Cardiac Doppler and Limited Color            Doppler (Both Spectral and Color Flow Doppler were utilized during            procedure). Indications:     Mitral Valve Regurgitation I34.0  History:         Patient has prior history of Echocardiogram examinations, most                  recent 09/04/2023. Risk Factors:Dyslipidemia and Former Smoker.  Sonographer:     Koleen Popper RDCS Referring Phys:  [id number] MAUDE JAYSON EMMER Diagnosing Phys: MAUDE EMMER MD PROCEDURE: After discussion of the risks and benefits of a TEE, an informed consent was obtained from the patient. The transesophogeal probe was passed without difficulty through the esophogus of the patient. Imaged were obtained with the patient in a left lateral decubitus position. Sedation performed by different physician. The patient was monitored while under deep sedation. Anesthestetic sedation was provided intravenously by  Anesthesiology: 190mg  of Propofol , 60mg  of Lidocaine . Image quality was good. The patient's vital signs; including heart rate, blood pressure, and oxygen saturation; remained stable throughout the procedure. The patient developed no complications during the procedure.  IMPRESSIONS  1. Left ventricular ejection fraction, by estimation, is 55 to 60%. The left ventricle has normal function. The left ventricle has no regional wall motion abnormalities.  2. Right ventricular systolic function is normal. The right ventricular size is normal.  3. Left atrial size was severely dilated. No left atrial/left atrial appendage thrombus was detected.  4. Right atrial size was moderately dilated.  5. Severe anteriorly directed MR. Flail segment of the P3 scallop Regurgitant volume 80 cc RF 59%. Posterior leaflet length 15.6 mm. Mean diatolic radient only 3 mmHg with MVA 6.5 cm2. . 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  No evidence of mitral stenosis.  6. 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  7. The aortic valve is tricuspid. There is mild calcification of the aortic valve. There is mild thickening of the aortic valve. Aortic valve regurgitation is mild. Aortic valve sclerosis is present, with no evidence of aortic valve stenosis.  8. The inferior vena cava is normal in size with greater than 50% respiratory variability, suggesting right atrial pressure of 3 mmHg.  9. 3D performed of the mitral valve and 3D performed of the tricuspid valve and demonstrates Abnormal 3D imaging with flail P3 scallop MV and bileaflet prolapse TV. Conclusion(s)/Recommendation(s): Normal biventricular function without evidence of hemodynamically significant valvular heart disease. FINDINGS  Left Ventricle:  Left ventricular ejection fraction, by estimation, is 55 to 60%. The left ventricle has normal function. The left ventricle  has no regional wall motion abnormalities. The left ventricular internal cavity size was normal in size. There is  no left ventricular hypertrophy. Right Ventricle: The right ventricular size is normal. Right vetricular wall thickness was not well visualized. Right ventricular systolic function is normal. Left Atrium: Left atrial size was severely dilated. No left atrial/left atrial appendage thrombus was detected. Right Atrium: Right atrial size was moderately dilated. Pericardium: There is no evidence of pericardial effusion. Mitral Valve: Severe anteriorly directed MR. Flail segment of the P3 scallop Regurgitant volume 80 cc RF 59%. Posterior leaflet length 15.6 mm. Mean diatolic radient only 3 mmHg with MVA 6.5 cm2. 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 No evidence of mitral valve stenosis. MV peak gradient, 4.2 mmHg. The mean mitral valve gradient is 2.0 mmHg. Tricuspid Valve: Prolapse of the septal and anterior leaflets. 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 No evidence of tricuspid stenosis. Aortic Valve: The aortic valve is tricuspid. There is mild calcification of the aortic valve. There is mild thickening of the aortic valve. Aortic valve regurgitation is mild. Aortic valve sclerosis is present, with no evidence of aortic valve stenosis. Pulmonic Valve: The pulmonic valve was normal in structure. Pulmonic valve regurgitation is mild. No evidence of pulmonic stenosis. Aorta: The aortic root is normal in size and structure. Venous: The inferior vena cava is normal in size with greater than 50% respiratory variability, suggesting right atrial pressure of 3 mmHg. IAS/Shunts: No atrial level shunt detected by color flow Doppler. Additional Comments: 3D was performed not requiring image post processing on an independent workstation and was abnormal. Spectral Doppler performed. LEFT VENTRICLE PLAX 2D LVOT diam:     1.90 cm LVOT Area:      2.84 cm   AORTA Ao Root diam: 2.50 cm Ao Asc diam:  2.90 cm MITRAL VALVE MV Peak grad: 4.2 mmHg  SHUNTS MV Mean grad: 2.0 mmHg  Systemic Diam: 1.90 cm MV Vmax:      1.03 m/s MV Vmean:     58.2 cm/s Maude Emmer MD Electronically signed by Maude Emmer MD Signature Date/Time: 11/22/2023/11:42:42 AM    Final    NM PET CT CARDIAC PERFUSION MULTI W/ABSOLUTE BLOODFLOW Result Date: 11/06/2023   LV perfusion is abnormal. There is evidence of ischemia. There is no evidence of infarction. Defect 1: There is a large defect with moderate reduction in uptake present in the mid to basal inferior and inferoseptal location(s) that is reversible. There is normal wall motion in the defect area. Consistent with ischemia. The defect is consistent with abnormal perfusion in the RCA territory.   Rest left ventricular function is normal. Rest EF: 60%. Stress left ventricular function is normal. Stress EF: 63%. End diastolic cavity size is normal. End systolic cavity size is normal.   Myocardial blood flow was computed to be 0.78ml/g/min at rest and 1.71ml/g/min at stress. Global myocardial blood flow reserve was 1.67 and was abnormal.   Coronary calcium  was present on the attenuation correction CT images. Moderate coronary calcifications were present. Coronary calcifications were present in the left anterior descending artery, left circumflex artery and right coronary artery distribution(s).   Findings are consistent with ischemia. The study is low risk.   Mid/Basal inferior inferior septal ischemia with abnormal MBFR in this area 1.33 and moderate 3 vessel calcium  noted CLINICAL DATA:  This over-read does not include interpretation of cardiac or coronary anatomy or pathology. The Cardiac PET CT interpretation by the cardiologist is attached. COMPARISON:  04/23/2023 chest CT FINDINGS: No pleural fluid. Surgical changes of right middle and right lower lobe wedge resection. Aortic atherosclerosis. calcified mediastinal nodes are likely  related to old granulomatous disease. Right-sided pleural thickening Normal imaged portions of the liver, stomach, pancreas, adrenal glands, left kidney. Old granulomatous disease in the spleen. Right rib defects could be posttraumatic or postsurgical. IMPRESSION: No acute findings in the imaged extracardiac chest. Aortic Atherosclerosis (ICD10-I70.0). Electronically Signed   By: Rockey Kilts M.D.   On: 11/06/2023 13:39   Disposition   Patient is being discharged home today in good condition.  Follow-up Plans & Appointments     Follow-up Information     Rana Lum CROME, NP Follow up.   Specialty: Cardiology Why: Hospital follow-up with Cardiology scheduled for 12/06/2023 at 2:45pm with Lum Rana, NP, at our [facility] office. Please arrive 20 minutes early for check-in. If this date/ time does not work for you, please call our office to reschedule. Contact information: [address] [place] [address] [phone]                Discharge Instructions     AMB Referral to Cardiac Rehabilitation - Phase II   Complete by: As directed    Patient needs Mitraclip, would wait until after Mitraclip to purse rehab   Diagnosis: Coronary Stents   After initial evaluation and assessments completed: Virtual Based Care may be provided alone or in conjunction with Phase 2 Cardiac Rehab based on patient barriers.: Yes   Intensive Cardiac Rehabilitation (ICR) [facility] location only OR Traditional Cardiac Rehabilitation (TCR) *If criteria for ICR are not met will enroll in TCR [facility] only): Yes        Discharge Medications   Allergies as of 11/22/2023       Reactions   Doxycycline Other (See Comments)   Upset stomach   Erythromycin Other (See Comments)   Upset stomach        Medication List     STOP taking these medications    aspirin  81 MG chewable tablet Replaced by: aspirin  EC 81 MG tablet       TAKE these medications    ALPRAZolam 0.25 MG  tablet Commonly known as: XANAX Take 0.25 mg by mouth daily as needed for anxiety (prior to air travel or dental appointments).   amLODipine  2.5 MG tablet Commonly known as: NORVASC  Take 2.5 mg by mouth daily.   aspirin  EC 81 MG tablet Take 1 tablet (81 mg total) by mouth daily. Swallow whole. Replaces: aspirin  81 MG chewable tablet   atorvastatin  10 MG tablet Commonly known as: LIPITOR TAKE 1 TABLET (10 MG TOTAL) BY MOUTH 3 (THREE) TIMES A WEEK. TAKES M-W-F.   azelastine 0.1 % nasal spray Commonly known as: ASTELIN Place 1 spray into both nostrils 2 (two) times daily as needed for rhinitis or allergies.   cholecalciferol  25 MCG (1000 UNIT) tablet Commonly known as: VITAMIN D3 Take 1,000 Units by mouth daily.   clopidogrel  75 MG tablet Commonly known as: Plavix  Take 1 tablet (75 mg total) by mouth daily.   denosumab  60 MG/ML Sosy injection Commonly known as: PROLIA  Inject 60 mg into the skin every 6 (six) months.   escitalopram 5 MG tablet Commonly known as: LEXAPRO Take 5 mg by mouth daily.   Flonase Allergy Relief 50 MCG/ACT nasal spray Generic drug:  fluticasone Place 1 spray into both nostrils daily as needed for allergies or rhinitis.   furosemide  20 MG tablet Commonly known as: Lasix  Take 1 tablet (20 mg total) by mouth 2 (two) times daily.   Linzess 72 MCG capsule Generic drug: linaclotide Take 72 mcg by mouth daily before breakfast. Per patient skips taking it when stomach is upset.   meclizine  25 MG tablet Commonly known as: ANTIVERT  Take 25 mg by mouth 3 (three) times daily as needed for dizziness.   Melatonin 3 MG Tbdp Take 1.5 mg by mouth at bedtime.   meloxicam  15 MG tablet Commonly known as: MOBIC  Take 15 mg by mouth daily as needed for pain.   methocarbamol 500 MG tablet Commonly known as: ROBAXIN Take 500 mg by mouth every 8 (eight) hours as needed for muscle spasms.   multivitamin with minerals Tabs tablet Take 1 tablet by mouth every  other day.   Nexletol  180 MG Tabs Generic drug: Bempedoic Acid  Take 1 tablet (180 mg total) by mouth daily.   nitroGLYCERIN  0.4 MG SL tablet Commonly known as: Nitrostat  Place 1 tablet (0.4 mg total) under the tongue every 5 (five) minutes as needed for chest pain.   Omega-3 Krill Oil 500 MG Caps Take 500 mg by mouth daily.   Pepcid AC 10 MG tablet Generic drug: famotidine Take 10 mg by mouth daily as needed for heartburn or indigestion.   REFRESH OP Place 1 drop into both eyes every 6 (six) hours as needed (dry eyes).           Allergies Allergies  Allergen Reactions   Doxycycline Other (See Comments)    Upset stomach   Erythromycin Other (See Comments)    Upset stomach    Outstanding Labs/Studies   Repeat BMET at follow-up visit.  Duration of Discharge Encounter   Greater than 30 minutes including physician time.  Signed, Aline FORBES Door, PA-C 11/22/2023, 3:02 PM  ATTENDING ATTESTATION:  After conducting a review of all available clinical information with the care team, interviewing the patient, and performing a physical exam, I agree with the findings and plan described in this note.   GEN: No acute distress.   HEENT:  MMM, no JVD, no scleral icterus Cardiac: RRR, 3/6 holosystolic murmur  Respiratory: Clear to auscultation bilaterally. GI: Soft, nontender, non-distended  MS: No edema; No deformity. Neuro:  Nonfocal  Vasc:  Moderate bruising R wrist  Patient doing well after uncomplicated yet complex PCI of RCA due to high risk stress test.  Discussed need for antiplatelet therapy given extensive stenting.  Patient also with severe degenerative MR with and moderate to severe TR.  Will review TEE in regards to mTEER (and perhaps future tTEER) next week.  APP discharge time:13 MD discharge time: 12  Lurena Red, MD Pager [phone]

## 2023-11-22 NOTE — Discharge Instructions (Signed)

## 2023-11-22 NOTE — Anesthesia Postprocedure Evaluation (Signed)
 Anesthesia Post Note  Patient: [name]  Procedure(s) Performed: TRANSESOPHAGEAL ECHOCARDIOGRAM     Patient location during evaluation: Cath Lab Anesthesia Type: MAC Level of consciousness: awake and alert Pain management: pain level controlled Vital Signs Assessment: post-procedure vital signs reviewed and stable Respiratory status: spontaneous breathing, nonlabored ventilation and respiratory function stable Cardiovascular status: stable and blood pressure returned to baseline Postop Assessment: no apparent nausea or vomiting Anesthetic complications: no   No notable events documented.  Last Vitals:  Vitals:   11/22/23 1149 11/22/23 1203  BP: 105/87   Pulse: 60   Resp: 20   Temp:    SpO2: 97% 99%    Last Pain:  Vitals:   11/22/23 1203  TempSrc:   PainSc: 0-No pain                 Kendry Pfarr

## 2023-11-22 NOTE — Progress Notes (Signed)
 Discussed with pt stents, Plavix , restrictions, exercise, NTG and CRPII. She understands to listen to her body regarding her MV and beginning to walk for exercise. Will refer to [place] CRPII however will more than likely need to wait until after MV resolved. [id number] [name] BS, ACSM-CEP 11/22/2023 3:22 PM

## 2023-11-22 NOTE — Progress Notes (Signed)
 Echocardiogram Echocardiogram Transesophageal has been performed.  Koleen KANDICE Popper, RDCS 11/22/2023, 11:33 AM

## 2023-11-22 NOTE — Interval H&P Note (Signed)
 History and Physical Interval Note:  11/22/2023 10:58 AM  [name]  has presented today for surgery, with the diagnosis of mr.  The various methods of treatment have been discussed with the patient and family. After consideration of risks, benefits and other options for treatment, the patient has consented to  Procedure(s): RIGHT/LEFT HEART CATH AND CORONARY ANGIOGRAPHY (N/A) as a surgical intervention.  The patient's history has been reviewed, patient examined, no change in status, stable for surgery.  I have reviewed the patient's chart and labs.  Questions were answered to the patient's satisfaction.     Rasul Decola K Sreeja Spies

## 2023-11-22 NOTE — Anesthesia Preprocedure Evaluation (Signed)
 Anesthesia Evaluation  Patient identified by MRN, date of birth, ID band Patient awake    Reviewed: Allergy & Precautions, NPO status , Patient's Chart, lab work & pertinent test results  History of Anesthesia Complications Negative for: history of anesthetic complications  Airway Mallampati: IV  TM Distance: >3 FB Neck ROM: Full    Dental  (+) Dental Advisory Given   Pulmonary neg shortness of breath, neg sleep apnea, neg COPD, neg recent URI, former smoker   breath sounds clear to auscultation       Cardiovascular + CAD  + Valvular Problems/Murmurs MR  Rhythm:Regular     Neuro/Psych negative neurological ROS  negative psych ROS   GI/Hepatic Neg liver ROS,GERD  ,,  Endo/Other  negative endocrine ROS    Renal/GU negative Renal ROS     Musculoskeletal  (+) Arthritis ,    Abdominal   Peds  Hematology negative hematology ROS (+)   Anesthesia Other Findings   Reproductive/Obstetrics                              Anesthesia Physical Anesthesia Plan  ASA: 2  Anesthesia Plan: MAC   Post-op Pain Management: Minimal or no pain anticipated   Induction: Intravenous  PONV Risk Score and Plan: 2 and Propofol  infusion and Treatment may vary due to age or medical condition  Airway Management Planned: Nasal Cannula, Natural Airway and Simple Face Mask  Additional Equipment: None  Intra-op Plan:   Post-operative Plan:   Informed Consent: I have reviewed the patients History and Physical, chart, labs and discussed the procedure including the risks, benefits and alternatives for the proposed anesthesia with the patient or authorized representative who has indicated his/her understanding and acceptance.     Dental advisory given  Plan Discussed with: CRNA  Anesthesia Plan Comments:         Anesthesia Quick Evaluation

## 2023-11-22 NOTE — Transfer of Care (Signed)
 Immediate Anesthesia Transfer of Care Note  Patient: [name]  Procedure(s) Performed: TRANSESOPHAGEAL ECHOCARDIOGRAM  Patient Location: Cath Lab  Anesthesia Type:MAC  Level of Consciousness: awake, alert , and oriented  Airway & Oxygen Therapy: Patient Spontanous Breathing  Post-op Assessment: Report given to RN, Post -op Vital signs reviewed and stable, and Patient moving all extremities  Post vital signs: Reviewed and stable  Last Vitals:  Vitals Value Taken Time  BP 103/49 11/22/23 11:30  Temp 36.9 C 11/22/23 11:29  Pulse 55 11/22/23 11:32  Resp 17 11/22/23 11:32  SpO2 96 % 11/22/23 11:32  Vitals shown include unfiled device data.  Last Pain:  Vitals:   11/22/23 1129  TempSrc: Tympanic  PainSc: Asleep         Complications: No notable events documented.

## 2023-11-25 ENCOUNTER — Telehealth (HOSPITAL_COMMUNITY): Payer: Self-pay

## 2023-11-25 NOTE — Telephone Encounter (Signed)
Pt is not interested in the cardiac rehab at this time. ?Closed referral. ?

## 2023-11-26 NOTE — Progress Notes (Addendum)
 Procedure Type: Isolated MVR Perioperative Outcome Estimate % Operative Mortality 3.45% Morbidity & Mortality 13.3% Stroke 3.56% Renal Failure 2.13% Reoperation 3.47% Prolonged Ventilation 8.47% Deep Sternal Wound Infection 0.035% Long Hospital Stay (>14 days) 8.68% Short Hospital Stay (<6 days)* 18.4%  Procedure Type: Isolated MVr Perioperative Outcome Estimate % Operative Mortality 2.71% Morbidity & Mortality 9.65% Stroke 3.56% Renal Failure 1.21% Reoperation 2.35% Prolonged Ventilation 6.54% Deep Sternal Wound Infection 0.036% Long Hospital Stay (>14 days) 5.26% Short Hospital Stay (<6 days)* 33%

## 2023-11-27 ENCOUNTER — Telehealth: Payer: Self-pay

## 2023-11-27 NOTE — Telephone Encounter (Signed)
 [name]

## 2023-11-28 DIAGNOSIS — I340 Nonrheumatic mitral (valve) insufficiency: Secondary | ICD-10-CM

## 2023-11-28 DIAGNOSIS — R0609 Other forms of dyspnea: Secondary | ICD-10-CM

## 2023-12-03 NOTE — Telephone Encounter (Signed)
 Spoke with the patient. Scheduled her for consult with Dr. Shyrl on 12/20/2023 in preparation for mTEER (PASCAL) 9/10. She understood she will get labs drawn that day. She was grateful for call and agreed with plan.

## 2023-12-06 ENCOUNTER — Ambulatory Visit: Attending: Emergency Medicine | Admitting: Emergency Medicine

## 2023-12-06 ENCOUNTER — Encounter: Payer: Self-pay | Admitting: Emergency Medicine

## 2023-12-06 VITALS — BP 134/60 | HR 70 | Ht 66.5 in | Wt 151.0 lb

## 2023-12-06 DIAGNOSIS — I2510 Atherosclerotic heart disease of native coronary artery without angina pectoris: Secondary | ICD-10-CM | POA: Insufficient documentation

## 2023-12-06 DIAGNOSIS — I10 Essential (primary) hypertension: Secondary | ICD-10-CM | POA: Insufficient documentation

## 2023-12-06 DIAGNOSIS — E785 Hyperlipidemia, unspecified: Secondary | ICD-10-CM | POA: Diagnosis not present

## 2023-12-06 DIAGNOSIS — I340 Nonrheumatic mitral (valve) insufficiency: Secondary | ICD-10-CM | POA: Insufficient documentation

## 2023-12-06 DIAGNOSIS — I6523 Occlusion and stenosis of bilateral carotid arteries: Secondary | ICD-10-CM | POA: Diagnosis not present

## 2023-12-06 DIAGNOSIS — I700 Atherosclerosis of aorta: Secondary | ICD-10-CM | POA: Diagnosis not present

## 2023-12-06 MED ORDER — AMLODIPINE BESYLATE 2.5 MG PO TABS: 2.5000 mg | ORAL_TABLET | Freq: Every day | ORAL | 3 refills | Status: DC

## 2023-12-06 MED ORDER — FUROSEMIDE 20 MG PO TABS: 20.0000 mg | ORAL_TABLET | Freq: Two times a day (BID) | ORAL | 3 refills | Status: DC

## 2023-12-06 MED ORDER — CLOPIDOGREL BISULFATE 75 MG PO TABS: 75.0000 mg | ORAL_TABLET | Freq: Every day | ORAL | 3 refills | Status: DC

## 2023-12-06 NOTE — Patient Instructions (Addendum)
 Medication Instructions:  NO CHANGES   Lab Work: BMET AND CBC TO BE DONE TODAY.   Testing/Procedures: NONE  Follow-Up: At [facility], you and your health needs are our priority.  As part of our continuing mission to provide you with exceptional heart care, our providers are all part of one team.  This team includes your primary Cardiologist (physician) and Advanced Practice Providers or APPs (Physician Assistants and Nurse Practitioners) who all work together to provide you with the care you need, when you need it.  Your next appointment:   1 MONTH  Provider:   FLORENCIO, MD

## 2023-12-06 NOTE — Progress Notes (Signed)
 Cardiology Office Note:    Date:  12/06/2023  ID:  Makenzie, Weisner Jun 17, 1940, MRN [id number] PCP: Aisha Harvey, MD  [facility] Providers Cardiologist:  Lurena MARLA Red, MD       Patient Profile:       Chief Complaint: Follow-up cardiac catheterization History of Present Illness:  [name] is a 83 y.o. female with visit-pertinent history of coronary artery disease, severe mitral valve regurgitation, emphysema, lung cancer, polymyalgia rheumatica, depression, anxiety, hyperlipidemia, carotid artery disease  Patient established with cardiology service on 08/07/2023 for elevated coronary calcium  score of 716 (85th percentile) and increased evidence of coronary calcium  on yearly CTs for lung cancer screening.  Echocardiogram 09/04/2023 showed LVEF 60 to 65%, no RWMA, RV function and size normal, myxomatous mitral valve with moderate to severe mitral valve regurgitation.  She underwent cardiac PET/CT which revealed large defect with moderate reduction in uptake in the mid to basal inferior and inferior septal locations that is reversible, consistent with ischemia.  She was last seen on 11/13/2023 by Dr. Red patient was experiencing NYHA class II symptoms of shortness of breath and fatigue.  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  R/LHC showed severe single-vessel CAD with 70% stenosis of ostial to proximal RCA, 90% stenosis of proximal RCA, and 80% stenosis of RCA with otherwise only minimal disease.  She underwent successful PCI with overlapping DES x 3 to the ostial to distal RCA.  LVEDP was elevated at 27 mmHg.  She was started on DAPT with aspirin  81 mg daily and  Plavix  75 mg daily with plans to continue this for 6 months followed by Plavix  monotherapy indefinitely.  She was also started on Lasix  20 mg daily.   Discussed the use of AI scribe software for clinical note transcription with the patient, who gave verbal consent to proceed.  History of Present Illness [name] is an 83 year old female with mitral regurgitation and coronary artery disease who presents for follow-up after cardiac catheterization.   Today she tells me she is doing well overall.  She is without any chest pains today.  She reports no improvement in her shortness of breath or fatigue.  She reports her right radial cath site has improved and bruising is now yellowing, and the swelling has reduced, leaving a small lump.   She has been experiencing dizziness for three days after cardiac catheterization, described as a 'swimming' sensation, which has been improving. She did not check her blood pressure during these episodes.  She takes low-dose aspirin  and atorvastatin  three times a week due to muscle aches. She is concerned about the cost of Nexletol  for cholesterol management and is considering alternatives due to its expense. She reports being 'awfully tired' and attributes this to her valve issue, noting that she used to have more energy. She is not a big drinker but has been trying to increase her fluid intake to address potential dehydration from her diuretic use.  She denies any leg swelling, orthopnea, PND, syncope, presyncope, melena, hematochezia  Review of systems:  Please see the history of present illness. All other systems are reviewed and otherwise negative.      Studies Reviewed:    EKG  Interpretation Date/Time:  Friday December 06 2023 14:33:05 EDT Ventricular Rate:  70 PR Interval:  154 QRS Duration:  86 QT Interval:  386 QTC Calculation: 416 R Axis:   51  Text Interpretation: Normal sinus rhythm Normal ECG When compared with ECG of 22-Nov-2023 14:00, No  significant change was found Confirmed by Rana Dixon [phone]) on 12/06/2023 4:20:39 PM    Cardiac PET/CT 11/06/2023   LV perfusion is abnormal. There is evidence of ischemia. There is no evidence of infarction. Defect 1: There is a large defect with moderate reduction in uptake present in the mid to basal inferior and inferoseptal location(s) that is reversible. There is normal wall motion in the defect area. Consistent with ischemia. The defect is consistent with abnormal perfusion in the RCA territory.   Rest left ventricular function is normal. Rest EF: 60%. Stress left ventricular function is normal. Stress EF: 63%. End diastolic cavity size is normal. End systolic cavity size is normal.   Myocardial blood flow was computed to be 0.32ml/g/min at rest and 1.26ml/g/min at stress. Global myocardial blood flow reserve was 1.67 and was abnormal.   Coronary calcium  was present on the attenuation correction CT images. Moderate coronary calcifications were present. Coronary calcifications were present in the left anterior descending artery, left circumflex artery and right coronary artery distribution(s).   Findings are consistent with ischemia. The study is low risk.   Mid/Basal inferior inferior septal ischemia with abnormal MBFR in this area 1.33 and moderate 3 vessel calcium  noted  TEE 11/22/2023  1. Left ventricular ejection fraction, by estimation, is 55 to 60%. The  left ventricle has normal function. The left ventricle has no regional  wall motion abnormalities.   2. Right ventricular systolic function is normal. The right ventricular  size is normal.   3. Left atrial size was severely dilated. No left atrial/left atrial  appendage thrombus was detected.   4. Right atrial size was moderately dilated.   5. Severe anteriorly directed MR. Flail segment of the P3 scallop  Regurgitant volume 80 cc RF 59%. Posterior leaflet length 15.6 mm. Mean  diatolic radient only 3 mmHg with MVA 6.5 cm2. .  The mitral valve is  myxomatous. Severe mitral valve regurgitation.   No evidence of mitral stenosis.   6. 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   7. The aortic valve is tricuspid. There is mild calcification of the  aortic valve. There is mild thickening of the aortic valve. Aortic valve  regurgitation is mild. Aortic valve sclerosis is present, with no evidence  of aortic valve stenosis.   8. The inferior vena cava is normal in size with greater than 50%  respiratory variability, suggesting right atrial pressure of 3 mmHg.   9. 3D performed of the mitral valve and 3D performed of the tricuspid  valve and demonstrates Abnormal 3D imaging with flail P3 scallop MV and  bileaflet prolapse TV.   Right/left cardiac catheterization 11/22/2023   Prox RCA lesion is 90% stenosed.   Mid RCA lesion is 80% stenosed.   Ost RCA to Prox RCA lesion is 70% stenosed.   A stent was successfully placed.   A stent was successfully placed.   A stent was successfully placed.   Post intervention, there is a 0% residual stenosis.   Post intervention, there is a 0% residual stenosis.   Post intervention, there is a 0% residual stenosis.  1.  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  Due to high risk stress test, complex PCI of the right coronary artery was pursued requiring GuideLiner, multiple wires, and the implantation of 3 drug-eluting stents placed from the ostium to the distal RCA. 2.  Mild, nonobstructive disease of the LAD and left circumflex. 3.  Fick cardiac output of 5.76 L/min and Fick cardiac index of 3.22 L/min/m with the following hemodynamics:            Right atrial pressure mean of 8 mmHg with C V waves to 12 mmHg            RV 41/5 with end-diastolic pressure 12 mmHg            Wedge pressure mean of 18 mmHg with V waves to 28 mmHg            PA pressure 40/16 with a mean of 27  mmHg            PVR of 1.6 Woods units            PA pulsatility index of 3 4.  LVEDP of 27 mmHg   Recommendation: Dual antiplatelet therapy with aspirin  and Plavix  for 6 months followed by Plavix  monotherapy indefinitely.  Start Lasix  20 mg twice daily.  Same-day discharge for PCI after 6 hours of bedrest/monitoring.  Diagnostic Dominance: Right  Intervention    Risk Assessment/Calculations:              Physical Exam:   VS:  BP 134/60 (BP Location: Left Arm, Patient Position: Sitting, Cuff Size: Normal)   Pulse 70   Ht 5' 6.5 (1.689 m)   Wt 151 lb (68.5 kg)   BMI 24.01 kg/m    Wt Readings from Last 3 Encounters:  12/06/23 151 lb (68.5 kg)  11/13/23 151 lb 6.4 oz (68.7 kg)  11/11/23 150 lb (68 kg)    GEN: Well nourished, well developed in no acute distress NECK: No JVD; No carotid bruits CARDIAC: RRR.  3/6 holosystolic murmur.  No rubs, gallops RESPIRATORY:  Clear to auscultation without rales, wheezing or rhonchi  ABDOMEN: Soft, non-tender, non-distended EXTREMITIES:  No edema; No acute deformity     Assessment and Plan:  Coronary artery disease S/p successful PCI with overlapping DES x 3 to ostial - distal RCA on 8/8 with LVEDP of 27 mmHg EKG today without acute ischemic changes Right radial cath site evaluated.  Had prior significant ecchymosis on forearm which has been improving.  No swelling today with good radial pulse, grip strength, and sensation - She is without any chest pain.  Continues to have exertional dyspnea without improvement likely from her severe MR.  There is no indication of further ischemic evaluation at this time.  She did have some dizziness for about 3 days that has improved.  She will make sure to statically hydrated - Plan for DAPT with aspirin  81 mg and Plavix  75 mg daily with plans to continue for 6 months followed by Plavix  monotherapy indefinitely - Continue atorvastatin  10 mg 3 times weekly and Nexletol  180 mg daily - BMET and  CBC  Mitral valve regurgitation TEE 8/8 showing severe anteriorly directed MR with flail segment of the P3 scallop and prolapse of the septal anterior leaflets of the tricuspid valve with moderate to severe regurgitation - She has NYHA class II symptoms of shortness of breath and fatigue - She is scheduled to see Dr. Shyrl with cardiothoracic surgery on 9/5  and PASCAL with Dr. Wendel on 9/10 - Continue Lasix  20 mg twice daily  Hyperlipidemia, LDL goal <55 Lipoprotein (A) 157 on 07/2023 LDL 95 on 07/2023 and not well-controlled She recently started Nexletol  earlier this month, however is concerned about the price.  She is not interested in injectable medications at this time however she will think on it - Plan to repeat lipid panel at follow-up visit - Continue Nexletol  180 mg daily and atorvastatin  10 mg 3 times weekly  Bilateral carotid artery stenosis Carotid duplex 05/2023 with bilateral ICA 1-49% stenosis - She denies any neurological symptoms  - Continue Nexletol  180 mg daily and atorvastatin  10 mg 3 times weekly  Hypertension Blood pressure today well-controlled at 134/60 - Continue current antihypertensive regimen     Dispo:  Return in about 1 month (around 01/06/2024).  Signed, Lum LITTIE Louis, NP

## 2023-12-07 LAB — CBC
Hematocrit: 38 % (ref 34.0–46.6)
Hemoglobin: 12 g/dL (ref 11.1–15.9)
MCH: 29.9 pg (ref 26.6–33.0)
MCHC: 31.6 g/dL (ref 31.5–35.7)
MCV: 95 fL (ref 79–97)
Platelets: 233 x10E3/uL (ref 150–450)
RBC: 4.01 x10E6/uL (ref 3.77–5.28)
RDW: 13 % (ref 11.7–15.4)
WBC: 5.1 x10E3/uL (ref 3.4–10.8)

## 2023-12-07 LAB — BASIC METABOLIC PANEL WITH GFR
BUN/Creatinine Ratio: 27 (ref 12–28)
BUN: 28 mg/dL — ABNORMAL HIGH (ref 8–27)
CO2: 24 mmol/L (ref 20–29)
Calcium: 9.4 mg/dL (ref 8.7–10.3)
Chloride: 100 mmol/L (ref 96–106)
Creatinine, Ser: 1.02 mg/dL — ABNORMAL HIGH (ref 0.57–1.00)
Glucose: 89 mg/dL (ref 70–99)
Potassium: 4.8 mmol/L (ref 3.5–5.2)
Sodium: 140 mmol/L (ref 134–144)
eGFR: 55 mL/min/1.73 — ABNORMAL LOW (ref >59)

## 2023-12-09 ENCOUNTER — Ambulatory Visit: Payer: Self-pay | Admitting: Emergency Medicine

## 2023-12-09 DIAGNOSIS — H5203 Hypermetropia, bilateral: Secondary | ICD-10-CM | POA: Diagnosis not present

## 2023-12-09 DIAGNOSIS — H26491 Other secondary cataract, right eye: Secondary | ICD-10-CM | POA: Diagnosis not present

## 2023-12-10 ENCOUNTER — Other Ambulatory Visit (HOSPITAL_COMMUNITY): Payer: Self-pay

## 2023-12-19 ENCOUNTER — Other Ambulatory Visit: Payer: Self-pay

## 2023-12-19 DIAGNOSIS — R0609 Other forms of dyspnea: Secondary | ICD-10-CM

## 2023-12-19 DIAGNOSIS — I340 Nonrheumatic mitral (valve) insufficiency: Secondary | ICD-10-CM

## 2023-12-19 NOTE — Progress Notes (Unsigned)
 [address]       [place] [address]             [phone]        [name] Medical Record #[id number] Date of Birth: 1940/06/01  Referring: Wendel Lurena POUR, MD Primary Care: Aisha Harvey, MD Primary Cardiologist:Arun POUR Wendel, MD  Chief Complaint:   No chief complaint on file.   History of Present Illness:     [name] is a 83 y.o. 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   Osteoporosis     Past Surgical History:  Procedure Laterality Date   ABDOMINAL HYSTERECTOMY     partial   APPENDECTOMY     BREAST EXCISIONAL BIOPSY Bilateral    benign   BREAST SURGERY Bilateral    bx    CORONARY STENT INTERVENTION N/A 11/22/2023   Procedure: CORONARY STENT INTERVENTION;  Surgeon: Wendel Lurena POUR, MD;  Location: [facility] INVASIVE CV LAB;  Service: Cardiovascular;  Laterality: N/A;   EYE SURGERY Bilateral    cataracts   LUNG REMOVAL, PARTIAL Right 01/26/2013   for Stage IA non small cell   RIGHT/LEFT HEART CATH AND CORONARY ANGIOGRAPHY N/A 11/22/2023   Procedure: RIGHT/LEFT HEART CATH AND CORONARY ANGIOGRAPHY;  Surgeon: Wendel Lurena POUR, MD;  Location: [facility] INVASIVE CV LAB;  Service: Cardiovascular;  Laterality: N/A;   TONSILLECTOMY     TRANSESOPHAGEAL ECHOCARDIOGRAM (CATH LAB) N/A 11/22/2023   Procedure: TRANSESOPHAGEAL ECHOCARDIOGRAM;  Surgeon: Delford Melinda BROCKS, MD;  Location: [facility] INVASIVE CV LAB;  Service: Cardiovascular;  Laterality: N/A;   VIDEO ASSISTED THORACOSCOPY (VATS)/WEDGE RESECTION Right 01/26/2013   Procedure: VIDEO ASSISTED THORACOSCOPY (VATS) with wedge resection right middle lobe nodule , segmental resection rigth lower lobe lung, lymph node  sampling,;  Surgeon: Elspeth BROCKS Millers, MD;  Location: [facility] OR;  Service: Thoracic;  Laterality: Right;    Social History:  Social History   Tobacco Use  Smoking Status Former   Current packs/day: 0.00   Average packs/day: 1 pack/day for 40.0 years (40.0 ttl pk-yrs)   Types: Cigarettes   Start date: 04/16/1965   Quit date: 04/16/2005   Years since quitting: 18.6  Smokeless Tobacco Never    Social History   Substance and Sexual Activity  Alcohol  Use No     Allergies  Allergen Reactions   Doxycycline Other (See Comments)    Upset stomach   Erythromycin Other (See Comments)    Upset stomach      Current Outpatient Medications  Medication Sig Dispense Refill   ALPRAZolam (XANAX) 0.25 MG tablet Take 0.25 mg by mouth daily as needed for anxiety (prior to air travel or dental appointments).     amLODipine  (NORVASC ) 2.5 MG tablet Take 1 tablet (2.5 mg total) by mouth daily. 90 tablet 3   aspirin  EC 81 MG tablet Take 1 tablet (81 mg total) by mouth daily. Swallow whole. 30 tablet 0   atorvastatin  (LIPITOR) 10 MG tablet TAKE 1 TABLET (10 MG TOTAL) BY MOUTH 3 (THREE) TIMES A WEEK. TAKES M-W-F. 36 tablet 3   azelastine (ASTELIN) 0.1 % nasal  spray Place 1 spray into both nostrils 2 (two) times daily as needed for rhinitis or allergies.     Bempedoic Acid  (NEXLETOL ) 180 MG TABS Take 1 tablet (180 mg total) by mouth daily. 90 tablet 3   cholecalciferol  (VITAMIN D3) 25 MCG (1000 UNIT) tablet Take 1,000 Units by mouth daily.     clopidogrel  (PLAVIX ) 75 MG tablet Take 1 tablet (75 mg total) by mouth daily. 90 tablet 3   denosumab  (PROLIA ) 60 MG/ML SOSY injection Inject 60 mg into the skin every 6 (six) months.     escitalopram (LEXAPRO) 5 MG tablet Take 5 mg by mouth daily.     famotidine (PEPCID AC) 10 MG tablet Take 10 mg by mouth daily as needed for heartburn or indigestion.     fluticasone (FLONASE ALLERGY RELIEF) 50 MCG/ACT nasal spray Place 1 spray into both nostrils daily as needed  for allergies or rhinitis.     furosemide  (LASIX ) 20 MG tablet Take 1 tablet (20 mg total) by mouth 2 (two) times daily. 180 tablet 3   linaclotide (LINZESS) 72 MCG capsule Take 72 mcg by mouth daily before breakfast. Per patient skips taking it when stomach is upset. (Patient taking differently: Take 72 mcg by mouth as needed. Per patient skips taking it when stomach is upset.)     meclizine  (ANTIVERT ) 25 MG tablet Take 25 mg by mouth 3 (three) times daily as needed for dizziness.      Melatonin 3 MG TBDP Take 1.5 mg by mouth at bedtime.     meloxicam  (MOBIC ) 15 MG tablet Take 15 mg by mouth daily as needed for pain.      methocarbamol (ROBAXIN) 500 MG tablet Take 500 mg by mouth every 8 (eight) hours as needed for muscle spasms.     Multiple Vitamin (MULTIVITAMIN WITH MINERALS) TABS tablet Take 1 tablet by mouth every other day.     nitroGLYCERIN  (NITROSTAT ) 0.4 MG SL tablet Place 1 tablet (0.4 mg total) under the tongue every 5 (five) minutes as needed for chest pain. 25 tablet 2   Omega-3 Krill Oil 500 MG CAPS Take 500 mg by mouth daily.     Polyvinyl Alcohol -Povidone (REFRESH OP) Place 1 drop into both eyes every 6 (six) hours as needed (dry eyes).     No current facility-administered medications for this visit.    (Not in a hospital admission)   Family History  Problem Relation Age of Onset   Lung cancer Father    Diabetes Mellitus II Father    Hypertension Father    Hyperlipidemia Father    Cancer - Other Brother      Review of Systems:   ROS    Physical Exam: There were no vitals taken for this visit. Physical Exam    Cardiac Studies & Procedures   ______________________________________________________________________________________________ CARDIAC CATHETERIZATION  CARDIAC CATHETERIZATION 11/22/2023  Conclusion   Prox RCA lesion is 90% stenosed.   Mid RCA lesion is 80% stenosed.   Ost RCA to Prox RCA lesion is 70% stenosed.   A stent was successfully placed.    A stent was successfully placed.   A stent was successfully placed.   Post intervention, there is a 0% residual stenosis.   Post intervention, there is a 0% residual stenosis.   Post intervention, there is a 0% residual stenosis.  1.  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  Due to high risk stress test, complex PCI of the right coronary artery was pursued requiring  GuideLiner, multiple wires, and the implantation of 3 drug-eluting stents placed from the ostium to the distal RCA. 2.  Mild, nonobstructive disease of the LAD and left circumflex. 3.  Fick cardiac output of 5.76 L/min and Fick cardiac index of 3.22 L/min/m with the following hemodynamics: Right atrial pressure mean of 8 mmHg with C V waves to 12 mmHg RV 41/5 with end-diastolic pressure 12 mmHg Wedge pressure mean of 18 mmHg with V waves to 28 mmHg PA pressure 40/16 with a mean of 27 mmHg PVR of 1.6 Woods units PA pulsatility index of 3 4.  LVEDP of 27 mmHg  Recommendation: Dual antiplatelet therapy with aspirin  and Plavix  for 6 months followed by Plavix  monotherapy indefinitely.  Start Lasix  20 mg twice daily.  Same-day discharge for PCI after 6 hours of bedrest/monitoring.  Findings Coronary Findings Diagnostic  Dominance: Right  Left Anterior Descending The vessel exhibits minimal luminal irregularities.  Left Circumflex The vessel exhibits minimal luminal irregularities.  Right Coronary Artery Ost RCA to Prox RCA lesion is 70% stenosed. Prox RCA lesion is 90% stenosed. Mid RCA lesion is 80% stenosed.  Intervention  Ost RCA to Prox RCA lesion Stent A stent was successfully placed. Post-Intervention Lesion Assessment The intervention was successful. Pre-interventional TIMI flow is 3. Post-intervention TIMI flow is 3. There is a 0% residual stenosis post intervention.  Prox RCA lesion Stent A stent was successfully placed. Post-Intervention Lesion Assessment The intervention was  successful. Pre-interventional TIMI flow is 3. Post-intervention TIMI flow is 3. There is a 0% residual stenosis post intervention.  Mid RCA lesion Stent A stent was successfully placed. Post-Intervention Lesion Assessment The intervention was successful. Pre-interventional TIMI flow is 3. Post-intervention TIMI flow is 3. There is a 0% residual stenosis post intervention.   STRESS TESTS  NM PET CT CARDIAC PERFUSION MULTI W/ABSOLUTE BLOODFLOW 11/06/2023  Narrative   LV perfusion is abnormal. There is evidence of ischemia. There is no evidence of infarction. Defect 1: There is a large defect with moderate reduction in uptake present in the mid to basal inferior and inferoseptal location(s) that is reversible. There is normal wall motion in the defect area. Consistent with ischemia. The defect is consistent with abnormal perfusion in the RCA territory.   Rest left ventricular function is normal. Rest EF: 60%. Stress left ventricular function is normal. Stress EF: 63%. End diastolic cavity size is normal. End systolic cavity size is normal.   Myocardial blood flow was computed to be 0.67ml/g/min at rest and 1.59ml/g/min at stress. Global myocardial blood flow reserve was 1.67 and was abnormal.   Coronary calcium  was present on the attenuation correction CT images. Moderate coronary calcifications were present. Coronary calcifications were present in the left anterior descending artery, left circumflex artery and right coronary artery distribution(s).   Findings are consistent with ischemia. The study is low risk.   Mid/Basal inferior inferior septal ischemia with abnormal MBFR in this area 1.33 and moderate 3 vessel calcium  noted  CLINICAL DATA:  This over-read does not include interpretation of cardiac or coronary anatomy or pathology. The Cardiac PET CT interpretation by the cardiologist is attached.  COMPARISON:  04/23/2023 chest CT  FINDINGS: No pleural fluid. Surgical changes of right  middle and right lower lobe wedge resection.  Aortic atherosclerosis. calcified mediastinal nodes are likely related to old granulomatous disease.  Right-sided pleural thickening  Normal imaged portions of the liver, stomach, pancreas, adrenal glands, left kidney. Old granulomatous disease in the spleen.  Right rib defects could  be posttraumatic or postsurgical.  IMPRESSION: No acute findings in the imaged extracardiac chest.  Aortic Atherosclerosis (ICD10-I70.0).   Electronically Signed By: Rockey Kilts M.D. On: 11/06/2023 13:39   ECHOCARDIOGRAM  ECHOCARDIOGRAM COMPLETE 09/04/2023  Narrative ECHOCARDIOGRAM REPORT    Patient Name:   [name] Date of Exam: 09/04/2023 Medical Rec #:  [id number]      Height:       67.5 in Accession #:    [id number]     Weight:       154.5 lb Date of Birth:  07/09/1940      BSA:          1.822 m Patient Age:    82 years       BP:           138/60 mmHg Patient Gender: F              HR:           76 bpm. Exam Location:  Outpatient  Procedure: 3D Echo, 2D Echo, Color Doppler, Cardiac Doppler and Strain Analysis (Both Spectral and Color Flow Doppler were utilized during procedure).  Indications:    Murmur  History:        Patient has no prior history of Echocardiogram examinations. Risk Factors:Former Smoker and Dyslipidemia.  Sonographer:    Orvil Holmes RDCS Referring Phys: [phone] [name]  IMPRESSIONS   1. Left ventricular ejection fraction, by estimation, is 60 to 65%. Left ventricular ejection fraction by 3D volume is 67 %. The left ventricle has normal function. The left ventricle has no regional wall motion abnormalities. Left ventricular diastolic function could not be evaluated. 2. Right ventricular systolic function is normal. The right ventricular size is normal. 3. Left atrial size was mildly dilated. 4. 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 No evidence of mitral  stenosis. 5. The aortic valve has an indeterminant number of cusps. Aortic valve regurgitation is trivial. Aortic valve sclerosis/calcification is present, without any evidence of aortic stenosis. 6. The inferior vena cava is normal in size with greater than 50% respiratory variability, suggesting right atrial pressure of 3 mmHg.  FINDINGS Left Ventricle: Left ventricular ejection fraction, by estimation, is 60 to 65%. Left ventricular ejection fraction by 3D volume is 67 %. The left ventricle has normal function. The left ventricle has no regional wall motion abnormalities. Global longitudinal strain performed but not reported based on interpreter judgement due to suboptimal tracking. The left ventricular internal cavity size was normal in size. There is no left ventricular hypertrophy. Left ventricular diastolic function could not be evaluated due to mitral regurgitation (moderate or greater). Left ventricular diastolic function could not be evaluated.  Right Ventricle: The right ventricular size is normal. No increase in right ventricular wall thickness. Right ventricular systolic function is normal.  Left Atrium: Left atrial size was mildly dilated.  Right Atrium: Right atrial size was normal in size.  Pericardium: There is no evidence of pericardial effusion.  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 No evidence of mitral valve stenosis.  Tricuspid Valve: The tricuspid valve is normal in structure. Tricuspid valve regurgitation is not demonstrated. No evidence of tricuspid stenosis.  Aortic Valve: The aortic valve has an indeterminant number of cusps. Aortic valve regurgitation is trivial. Aortic valve sclerosis/calcification is present, without any evidence of aortic stenosis. Aortic valve mean gradient measures 2.0 mmHg. Aortic valve peak gradient measures 4.0 mmHg. Aortic  valve area, by VTI measures 2.43 cm.  Pulmonic Valve: The  pulmonic valve was normal in structure. Pulmonic valve regurgitation is not visualized. No evidence of pulmonic stenosis.  Aorta: The aortic root is normal in size and structure.  Venous: The inferior vena cava is normal in size with greater than 50% respiratory variability, suggesting right atrial pressure of 3 mmHg.  IAS/Shunts: No atrial level shunt detected by color flow Doppler.  Additional Comments: 3D was performed not requiring image post processing on an independent workstation and was normal.   LEFT VENTRICLE PLAX 2D LVIDd:         4.81 cm         Diastology LVIDs:         2.65 cm         LV e' medial:    7.40 cm/s LV PW:         0.86 cm         LV E/e' medial:  11.6 LV IVS:        0.77 cm         LV e' lateral:   6.85 cm/s LVOT diam:     2.00 cm         LV E/e' lateral: 12.5 LV SV:         52 LV SV Index:   29 LVOT Area:     3.14 cm        3D Volume EF LV 3D EF:    Left ventricul ar ejection fraction by 3D volume is 67 %.  3D Volume EF: 3D EF:        67 % LV EDV:       142 ml LV ESV:       47 ml LV SV:        95 ml  RIGHT VENTRICLE RV Basal diam:  3.43 cm RV Mid diam:    2.37 cm RV S prime:     12.90 cm/s TAPSE (M-mode): 2.3 cm  LEFT ATRIUM             Index        RIGHT ATRIUM           Index LA diam:        3.70 cm 2.03 cm/m   RA Area:     13.00 cm LA Vol (A2C):   72.3 ml 39.68 ml/m  RA Volume:   27.70 ml  15.20 ml/m LA Vol (A4C):   58.3 ml 31.99 ml/m LA Biplane Vol: 64.8 ml 35.56 ml/m AORTIC VALVE AV Area (Vmax):    2.54 cm AV Area (Vmean):   2.29 cm AV Area (VTI):     2.43 cm AV Vmax:           99.90 cm/s AV Vmean:          69.700 cm/s AV VTI:            0.215 m AV Peak Grad:      4.0 mmHg AV Mean Grad:      2.0 mmHg LVOT Vmax:         80.70 cm/s LVOT Vmean:        50.800 cm/s LVOT VTI:          0.166 m LVOT/AV VTI ratio: 0.77  AORTA Ao Root diam: 2.70 cm Ao Asc diam:  3.40 cm  MITRAL VALVE                  TRICUSPID  VALVE MV  Area (PHT): 3.60 cm       TR Peak grad:   36.5 mmHg MV Decel Time: 211 msec       TR Vmax:        302.00 cm/s MR Peak grad:    93.3 mmHg MR Mean grad:    64.0 mmHg    SHUNTS MR Vmax:         483.00 cm/s  Systemic VTI:  0.17 m MR Vmean:        391.0 cm/s   Systemic Diam: 2.00 cm MR PISA:         5.09 cm MR PISA Eff ROA: 32 mm MR PISA Radius:  0.90 cm MV E velocity: 85.90 cm/s MV A velocity: 66.00 cm/s MV E/A ratio:  1.30  Kardie Tobb DO Electronically signed by Dub Huntsman DO Signature Date/Time: 09/04/2023/3:11:48 PM    Final   TEE  ECHO TEE 11/22/2023  Narrative TRANSESOPHOGEAL ECHO REPORT    Patient Name:   [name] Date of Exam: 11/22/2023 Medical Rec #:  [id number]      Height:       66.5 in Accession #:    [id number]     Weight:       151.4 lb Date of Birth:  Jun 14, 1940      BSA:          1.787 m Patient Age:    83 years       BP:           129/59 mmHg Patient Gender: F              HR:           75 bpm. Exam Location:  Inpatient  Procedure: 3D Echo, Transesophageal Echo, Cardiac Doppler and Limited Color Doppler (Both Spectral and Color Flow Doppler were utilized during procedure).  Indications:     Mitral Valve Regurgitation I34.0  History:         Patient has prior history of Echocardiogram examinations, most recent 09/04/2023. Risk Factors:Dyslipidemia and Former Smoker.  Sonographer:     Koleen Popper RDCS Referring Phys:  [id number] [name] Diagnosing Phys: Melinda EMMER MD  PROCEDURE: After discussion of the risks and benefits of a TEE, an informed consent was obtained from the patient. The transesophogeal probe was passed without difficulty through the esophogus of the patient. Imaged were obtained with the patient in a left lateral decubitus position. Sedation performed by different physician. The patient was monitored while under deep sedation. Anesthestetic sedation was provided intravenously by Anesthesiology: 190mg  of Propofol , 60mg  of  Lidocaine . Image quality was good. The patient's vital signs; including heart rate, blood pressure, and oxygen saturation; remained stable throughout the procedure. The patient developed no complications during the procedure.  IMPRESSIONS   1. Left ventricular ejection fraction, by estimation, is 55 to 60%. The left ventricle has normal function. The left ventricle has no regional wall motion abnormalities. 2. Right ventricular systolic function is normal. The right ventricular size is normal. 3. Left atrial size was severely dilated. No left atrial/left atrial appendage thrombus was detected. 4. Right atrial size was moderately dilated. 5. Severe anteriorly directed MR. Flail segment of the P3 scallop Regurgitant volume 80 cc RF 59%. Posterior leaflet length 15.6 mm. Mean diatolic radient only 3 mmHg with MVA 6.5 cm2. . 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 No evidence of mitral stenosis. 6. 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 The  tricuspid valve is myxomatous. 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 7. The aortic valve is tricuspid. There is mild calcification of the aortic valve. There is mild thickening of the aortic valve. Aortic valve regurgitation is mild. Aortic valve sclerosis is present, with no evidence of aortic valve stenosis. 8. The inferior vena cava is normal in size with greater than 50% respiratory variability, suggesting right atrial pressure of 3 mmHg. 9. 3D performed of the mitral valve and 3D performed of the tricuspid valve and demonstrates Abnormal 3D imaging with flail P3 scallop MV and bileaflet prolapse TV.  Conclusion(s)/Recommendation(s): Normal biventricular function without evidence of hemodynamically significant valvular heart disease.  FINDINGS Left Ventricle: Left ventricular ejection fraction, by estimation, is 55 to 60%. The left ventricle has normal function. The left ventricle has no regional wall motion  abnormalities. The left ventricular internal cavity size was normal in size. There is no left ventricular hypertrophy.  Right Ventricle: The right ventricular size is normal. Right vetricular wall thickness was not well visualized. Right ventricular systolic function is normal.  Left Atrium: Left atrial size was severely dilated. No left atrial/left atrial appendage thrombus was detected.  Right Atrium: Right atrial size was moderately dilated.  Pericardium: There is no evidence of pericardial effusion.  Mitral Valve: Severe anteriorly directed MR. Flail segment of the P3 scallop Regurgitant volume 80 cc RF 59%. Posterior leaflet length 15.6 mm. Mean diatolic radient only 3 mmHg with MVA 6.5 cm2. 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 No evidence of mitral valve stenosis. MV peak gradient, 4.2 mmHg. The mean mitral valve gradient is 2.0 mmHg.  Tricuspid Valve: Prolapse of the septal and anterior leaflets. 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 No evidence of tricuspid stenosis.  Aortic Valve: The aortic valve is tricuspid. There is mild calcification of the aortic valve. There is mild thickening of the aortic valve. Aortic valve regurgitation is mild. Aortic valve sclerosis is present, with no evidence of aortic valve stenosis.  Pulmonic Valve: The pulmonic valve was normal in structure. Pulmonic valve regurgitation is mild. No evidence of pulmonic stenosis.  Aorta: The aortic root is normal in size and structure.  Venous: The inferior vena cava is normal in size with greater than 50% respiratory variability, suggesting right atrial pressure of 3 mmHg.  IAS/Shunts: No atrial level shunt detected by color flow Doppler.  Additional Comments: 3D was performed not requiring image post processing on an independent workstation and was abnormal. Spectral Doppler performed.  LEFT VENTRICLE PLAX 2D LVOT diam:     1.90  cm LVOT Area:     2.84 cm    AORTA Ao Root diam: 2.50 cm Ao Asc diam:  2.90 cm  MITRAL VALVE MV Peak grad: 4.2 mmHg  SHUNTS MV Mean grad: 2.0 mmHg  Systemic Diam: 1.90 cm MV Vmax:      1.03 m/s MV Vmean:     58.2 cm/s  Melinda Emmer MD Electronically signed by Melinda Emmer MD Signature Date/Time: 11/22/2023/11:42:42 AM    Final    CT SCANS  CT CARDIAC SCORING (SELF PAY ONLY) 05/22/2023  Addendum 06/02/2023 10:38 AM ADDENDUM REPORT: 06/02/2023 10:35  EXAM: OVER-READ INTERPRETATION CT CHEST  The following report is an over-read performed by radiologist Dr. Norman Hopper of [facility] on 06/02/2023. This over-read does not include interpretation of cardiac or coronary anatomy or pathology. The coronary calcium  score interpretation by the cardiologist is attached.  COMPARISON:  CT chest  dated 04/23/2023.  FINDINGS: Cardiovascular: Vascular calcifications are seen in the thoracic aorta. Normal heart size. No pericardial effusion.  Mediastinum/Nodes: No enlarged mediastinal lymph nodes. The visible trachea and esophagus demonstrate no significant findings.  Lungs/Pleura: Emphysematous changes are noted. The patient is status post right middle and right lower lobe wedge resection.  Upper Abdomen: No acute abnormality.  Musculoskeletal: Degenerative changes are seen in the spine. Subacute rib fractures on the right are redemonstrated.  IMPRESSION: No acute findings in the chest.  Aortic Atherosclerosis (ICD10-I70.0) and Emphysema (ICD10-J43.9).   Electronically Signed By: Norman Hopper M.D. On: 06/02/2023 10:35  Narrative CLINICAL DATA:  Cardiovascular Disease Risk stratification  EXAM: Coronary Calcium  Score  TECHNIQUE: A gated, non-contrast computed tomography scan of the heart was performed using 3mm slice thickness. Axial images were analyzed on a dedicated workstation. Calcium  scoring of the coronary arteries was performed using the  Agatston method.  FINDINGS: Coronary Calcium  Score:  Left main: 72.6  Left anterior descending artery: 232  Left circumflex artery: 33.1  Right coronary artery: 378  Total: 716  Percentile: 85th  Pericardium: Normal.  Ascending Aorta: 33 mm at the mid ascending aorta measured in an axial plane. Aortic atherosclerosis.  IMPRESSION: 1. Coronary calcium  score of 716. This was 85th percentile for age-, race-, and sex-matched controls.  2.  Aortic atherosclerosis.  3. Non-cardiac: See separate report from [facility].  RECOMMENDATIONS: Coronary artery calcium  (CAC) score is a strong predictor of incident coronary heart disease (CHD) and provides predictive information beyond traditional risk factors. CAC scoring is reasonable to use in the decision to withhold, postpone, or initiate statin therapy in intermediate-risk or selected borderline-risk asymptomatic adults (age 55-75 years and LDL-C >=70 to <190 mg/dL) who do not have diabetes or established atherosclerotic cardiovascular disease (ASCVD).* In intermediate-risk (10-year ASCVD risk >=7.5% to <20%) adults or selected borderline-risk (10-year ASCVD risk >=5% to <7.5%) adults in whom a CAC score is measured for the purpose of making a treatment decision the following recommendations have been made:  If CAC=0, it is reasonable to withhold statin therapy and reassess in 5 to 10 years, as long as higher risk conditions are absent (diabetes mellitus, family history of premature CHD in first degree relatives (males <55 years; females <65 years), cigarette smoking, or LDL >=190 mg/dL).  If CAC is 1 to 99, it is reasonable to initiate statin therapy for patients >=58 years of age.  If CAC is >=100 or >=75th percentile, it is reasonable to initiate statin therapy at any age.  Cardiology referral should be considered for patients with CAC scores >=400 or >=75th percentile.  *2018  AHA/ACC/AACVPR/AAPA/ABC/ACPM/ADA/AGS/APhA/ASPC/NLA/PCNA Guideline on the Management of Blood Cholesterol: A Report of the American College of Cardiology/American Heart Association Task Force on Clinical Practice Guidelines. J Am Coll Cardiol. 2019;73(24):3168-3209.  Madonna Large, DO, [facility]  Electronically Signed: By: Madonna Large D.O. On: 05/23/2023 18:48     ______________________________________________________________________________________________      ECG ***    I have independently reviewed the above radiologic studies and discussed with the patient   Recent Lab Findings: Lab Results  Component Value Date   WBC 5.1 12/06/2023   HGB 12.0 12/06/2023   HCT 38.0 12/06/2023   PLT 233 12/06/2023   GLUCOSE 89 12/06/2023   ALT 19 08/12/2023   AST 22 08/12/2023   NA 140 12/06/2023   K 4.8 12/06/2023   CL 100 12/06/2023   CREATININE 1.02 (H) 12/06/2023   BUN 28 (H) 12/06/2023   CO2  24 12/06/2023   INR 1.07 01/23/2013      Assessment / Plan:   83 y.o. female with severe mitral valve regurgitation.  ***     I  spent {CHL ONC TIME VISIT - DTPQU:[id number]} counseling the patient face to face.   [name] 12/19/2023 5:54 PM

## 2023-12-19 NOTE — H&P (View-Only) (Signed)
 [address]       [place] [address]             [phone]        DESMOND TUFANO [facility] Medical Record #[id number] Date of Birth: July 03, 1940  Referring: Wendel Lurena POUR, MD Primary Care: Aisha Harvey, MD Primary Cardiologist:Arun POUR Wendel, MD  Chief Complaint:    Chief Complaint  Patient presents with   Mitral Regurgitation    Further discuss surgery    History of Present Illness:     KILEEN LANGE is a 83 y.o. female presents for surgical evaluation of mitral valve disease.  She has a history of lung cancer and has undergone a resection in the past.  She does admit to dizziness and fatigue on occasion.  She also has some shortness of breath with exertion.      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   Osteoporosis     Past Surgical History:  Procedure Laterality Date   ABDOMINAL HYSTERECTOMY     partial   APPENDECTOMY     BREAST EXCISIONAL BIOPSY Bilateral    benign   BREAST SURGERY Bilateral    bx    CORONARY STENT INTERVENTION N/A 11/22/2023   Procedure: CORONARY STENT INTERVENTION;  Surgeon: Wendel Lurena POUR, MD;  Location: [facility] INVASIVE CV LAB;  Service: Cardiovascular;  Laterality: N/A;   EYE SURGERY Bilateral    cataracts   LUNG REMOVAL, PARTIAL Right 01/26/2013   for Stage IA non small cell   RIGHT/LEFT HEART CATH AND CORONARY ANGIOGRAPHY N/A 11/22/2023   Procedure: RIGHT/LEFT HEART CATH AND CORONARY ANGIOGRAPHY;  Surgeon: Wendel Lurena POUR, MD;  Location: [facility] INVASIVE CV LAB;  Service: Cardiovascular;  Laterality: N/A;   TONSILLECTOMY     TRANSESOPHAGEAL ECHOCARDIOGRAM (CATH LAB) N/A 11/22/2023   Procedure: TRANSESOPHAGEAL ECHOCARDIOGRAM;  Surgeon: Delford Maude BROCKS, MD;  Location: [facility] INVASIVE CV  LAB;  Service: Cardiovascular;  Laterality: N/A;   VIDEO ASSISTED THORACOSCOPY (VATS)/WEDGE RESECTION Right 01/26/2013   Procedure: VIDEO ASSISTED THORACOSCOPY (VATS) with wedge resection right middle lobe nodule , segmental resection rigth lower lobe lung, lymph node sampling,;  Surgeon: Elspeth BROCKS Millers, MD;  Location: [facility] OR;  Service: Thoracic;  Laterality: Right;    Social History:  Social History   Tobacco Use  Smoking Status Former   Current packs/day: 0.00   Average packs/day: 1 pack/day for 40.0 years (40.0 ttl pk-yrs)   Types: Cigarettes   Start date: 04/16/1965   Quit date: 04/16/2005   Years since quitting: 18.6  Smokeless Tobacco Never    Social History   Substance and Sexual Activity  Alcohol  Use No     Allergies  Allergen Reactions   Doxycycline Other (See Comments)    Upset stomach   Erythromycin Other (See Comments)    Upset stomach      Current Outpatient Medications  Medication Sig Dispense Refill   ALPRAZolam  (XANAX ) 0.25 MG tablet Take 0.25 mg by mouth daily as needed for anxiety (prior to air travel or dental appointments).     amLODipine  (NORVASC ) 2.5 MG tablet Take 1 tablet (2.5 mg total) by mouth daily. 90 tablet 3  aspirin  EC 81 MG tablet Take 1 tablet (81 mg total) by mouth daily. Swallow whole. 30 tablet 0   atorvastatin  (LIPITOR) 10 MG tablet TAKE 1 TABLET (10 MG TOTAL) BY MOUTH 3 (THREE) TIMES A WEEK. TAKES M-W-F. 36 tablet 3   azelastine  (ASTELIN ) 0.1 % nasal spray Place 1 spray into both nostrils 2 (two) times daily as needed for rhinitis or allergies.     Bempedoic Acid  (NEXLETOL ) 180 MG TABS Take 1 tablet (180 mg total) by mouth daily. 90 tablet 3   cholecalciferol  (VITAMIN D3) 25 MCG (1000 UNIT) tablet Take 1,000 Units by mouth daily.     clopidogrel  (PLAVIX ) 75 MG tablet Take 1 tablet (75 mg total) by mouth daily. 90 tablet 3   denosumab  (PROLIA ) 60 MG/ML SOSY injection Inject 60 mg into the skin every 6 (six) months.     escitalopram   (LEXAPRO ) 5 MG tablet Take 5 mg by mouth daily.     famotidine  (PEPCID  AC) 10 MG tablet Take 10 mg by mouth daily as needed for heartburn or indigestion.     fluticasone (FLONASE ALLERGY RELIEF) 50 MCG/ACT nasal spray Place 1 spray into both nostrils daily as needed for allergies or rhinitis.     furosemide  (LASIX ) 20 MG tablet Take 1 tablet (20 mg total) by mouth 2 (two) times daily. 180 tablet 3   linaclotide (LINZESS) 72 MCG capsule Take 72 mcg by mouth daily before breakfast. Per patient skips taking it when stomach is upset. (Patient taking differently: Take 72 mcg by mouth as needed. Per patient skips taking it when stomach is upset.)     meclizine  (ANTIVERT ) 25 MG tablet Take 25 mg by mouth 3 (three) times daily as needed for dizziness.      Melatonin 3 MG TBDP Take 1.5 mg by mouth at bedtime.     meloxicam  (MOBIC ) 15 MG tablet Take 15 mg by mouth daily as needed for pain.      methocarbamol  (ROBAXIN ) 500 MG tablet Take 500 mg by mouth every 8 (eight) hours as needed for muscle spasms.     Multiple Vitamin (MULTIVITAMIN WITH MINERALS) TABS tablet Take 1 tablet by mouth every other day.     nitroGLYCERIN  (NITROSTAT ) 0.4 MG SL tablet Place 1 tablet (0.4 mg total) under the tongue every 5 (five) minutes as needed for chest pain. 25 tablet 2   Omega-3 Krill Oil 500 MG CAPS Take 500 mg by mouth daily.     Polyvinyl Alcohol -Povidone (REFRESH OP) Place 1 drop into both eyes every 6 (six) hours as needed (dry eyes).     No current facility-administered medications for this visit.    (Not in a hospital admission)   Family History  Problem Relation Age of Onset   Lung cancer Father    Diabetes Mellitus II Father    Hypertension Father    Hyperlipidemia Father    Cancer - Other Brother      Review of Systems:   Review of Systems  Constitutional:  Positive for malaise/fatigue.  Respiratory:  Positive for shortness of breath.   Cardiovascular:  Negative for chest pain.      Physical  Exam: BP (!) 147/68 (BP Location: Right Arm, Patient Position: Sitting, Cuff Size: Normal)   Pulse 69   Resp 20   Ht 5' 6.5 (1.689 m)   Wt 151 lb (68.5 kg)   SpO2 94% Comment: RA  BMI 24.01 kg/m  Physical Exam Constitutional:      General: She is  not in acute distress.    Appearance: She is not ill-appearing.  HENT:     Head: Normocephalic and atraumatic.  Eyes:     Extraocular Movements: Extraocular movements intact.  Cardiovascular:     Rate and Rhythm: Normal rate.  Pulmonary:     Effort: Pulmonary effort is normal. No respiratory distress.  Abdominal:     General: Abdomen is flat. There is no distension.  Musculoskeletal:        General: Normal range of motion.     Cervical back: Normal range of motion.  Skin:    General: Skin is warm and dry.  Neurological:     General: No focal deficit present.     Mental Status: She is alert and oriented to person, place, and time.       Cardiac Studies & Procedures   ______________________________________________________________________________________________ CARDIAC CATHETERIZATION  CARDIAC CATHETERIZATION 11/22/2023  Conclusion   Prox RCA lesion is 90% stenosed.   Mid RCA lesion is 80% stenosed.   Ost RCA to Prox RCA lesion is 70% stenosed.   A stent was successfully placed.   A stent was successfully placed.   A stent was successfully placed.   Post intervention, there is a 0% residual stenosis.   Post intervention, there is a 0% residual stenosis.   Post intervention, there is a 0% residual stenosis.  1.  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  Due to high risk stress test, complex PCI of the right coronary artery was pursued requiring GuideLiner, multiple wires, and the implantation of 3 drug-eluting stents placed from the ostium to the distal RCA. 2.  Mild, nonobstructive disease of the LAD and left circumflex. 3.  Fick cardiac output of 5.76 L/min and Fick cardiac index of 3.22 L/min/m with the  following hemodynamics: Right atrial pressure mean of 8 mmHg with C V waves to 12 mmHg RV 41/5 with end-diastolic pressure 12 mmHg Wedge pressure mean of 18 mmHg with V waves to 28 mmHg PA pressure 40/16 with a mean of 27 mmHg PVR of 1.6 Woods units PA pulsatility index of 3 4.  LVEDP of 27 mmHg  Recommendation: Dual antiplatelet therapy with aspirin  and Plavix  for 6 months followed by Plavix  monotherapy indefinitely.  Start Lasix  20 mg twice daily.  Same-day discharge for PCI after 6 hours of bedrest/monitoring.  Findings Coronary Findings Diagnostic  Dominance: Right  Left Anterior Descending The vessel exhibits minimal luminal irregularities.  Left Circumflex The vessel exhibits minimal luminal irregularities.  Right Coronary Artery Ost RCA to Prox RCA lesion is 70% stenosed. Prox RCA lesion is 90% stenosed. Mid RCA lesion is 80% stenosed.  Intervention  Ost RCA to Prox RCA lesion Stent A stent was successfully placed. Post-Intervention Lesion Assessment The intervention was successful. Pre-interventional TIMI flow is 3. Post-intervention TIMI flow is 3. There is a 0% residual stenosis post intervention.  Prox RCA lesion Stent A stent was successfully placed. Post-Intervention Lesion Assessment The intervention was successful. Pre-interventional TIMI flow is 3. Post-intervention TIMI flow is 3. There is a 0% residual stenosis post intervention.  Mid RCA lesion Stent A stent was successfully placed. Post-Intervention Lesion Assessment The intervention was successful. Pre-interventional TIMI flow is 3. Post-intervention TIMI flow is 3. There is a 0% residual stenosis post intervention.   STRESS TESTS  NM PET CT CARDIAC PERFUSION MULTI W/ABSOLUTE BLOODFLOW 11/06/2023  Narrative   LV perfusion is abnormal. There is evidence of ischemia. There is no evidence of infarction. Defect 1: There  is a large defect with moderate reduction in uptake present in the mid to  basal inferior and inferoseptal location(s) that is reversible. There is normal wall motion in the defect area. Consistent with ischemia. The defect is consistent with abnormal perfusion in the RCA territory.   Rest left ventricular function is normal. Rest EF: 60%. Stress left ventricular function is normal. Stress EF: 63%. End diastolic cavity size is normal. End systolic cavity size is normal.   Myocardial blood flow was computed to be 0.14ml/g/min at rest and 1.66ml/g/min at stress. Global myocardial blood flow reserve was 1.67 and was abnormal.   Coronary calcium  was present on the attenuation correction CT images. Moderate coronary calcifications were present. Coronary calcifications were present in the left anterior descending artery, left circumflex artery and right coronary artery distribution(s).   Findings are consistent with ischemia. The study is low risk.   Mid/Basal inferior inferior septal ischemia with abnormal MBFR in this area 1.33 and moderate 3 vessel calcium  noted  CLINICAL DATA:  This over-read does not include interpretation of cardiac or coronary anatomy or pathology. The Cardiac PET CT interpretation by the cardiologist is attached.  COMPARISON:  04/23/2023 chest CT  FINDINGS: No pleural fluid. Surgical changes of right middle and right lower lobe wedge resection.  Aortic atherosclerosis. calcified mediastinal nodes are likely related to old granulomatous disease.  Right-sided pleural thickening  Normal imaged portions of the liver, stomach, pancreas, adrenal glands, left kidney. Old granulomatous disease in the spleen.  Right rib defects could be posttraumatic or postsurgical.  IMPRESSION: No acute findings in the imaged extracardiac chest.  Aortic Atherosclerosis (ICD10-I70.0).   Electronically Signed By: Rockey Kilts M.D. On: 11/06/2023 13:39   ECHOCARDIOGRAM  ECHOCARDIOGRAM COMPLETE 09/04/2023  Narrative ECHOCARDIOGRAM REPORT    Patient Name:    BELEM HINTZE Date of Exam: 09/04/2023 Medical Rec #:  [id number]      Height:       67.5 in Accession #:    [id number]     Weight:       154.5 lb Date of Birth:  01-07-1941      BSA:          1.822 m Patient Age:    82 years       BP:           138/60 mmHg Patient Gender: F              HR:           76 bpm. Exam Location:  Outpatient  Procedure: 3D Echo, 2D Echo, Color Doppler, Cardiac Doppler and Strain Analysis (Both Spectral and Color Flow Doppler were utilized during procedure).  Indications:    Murmur  History:        Patient has no prior history of Echocardiogram examinations. Risk Factors:Former Smoker and Dyslipidemia.  Sonographer:    Orvil Holmes RDCS Referring Phys: [phone] ROSALINE CHRISTELLA SWINYER  IMPRESSIONS   1. Left ventricular ejection fraction, by estimation, is 60 to 65%. Left ventricular ejection fraction by 3D volume is 67 %. The left ventricle has normal function. The left ventricle has no regional wall motion abnormalities. Left ventricular diastolic function could not be evaluated. 2. Right ventricular systolic function is normal. The right ventricular size is normal. 3. Left atrial size was mildly dilated. 4. 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 No evidence of mitral stenosis. 5. The aortic valve has an indeterminant number of cusps. Aortic valve regurgitation is  trivial. Aortic valve sclerosis/calcification is present, without any evidence of aortic stenosis. 6. The inferior vena cava is normal in size with greater than 50% respiratory variability, suggesting right atrial pressure of 3 mmHg.  FINDINGS Left Ventricle: Left ventricular ejection fraction, by estimation, is 60 to 65%. Left ventricular ejection fraction by 3D volume is 67 %. The left ventricle has normal function. The left ventricle has no regional wall motion abnormalities. Global longitudinal strain performed but not reported based on interpreter judgement  due to suboptimal tracking. The left ventricular internal cavity size was normal in size. There is no left ventricular hypertrophy. Left ventricular diastolic function could not be evaluated due to mitral regurgitation (moderate or greater). Left ventricular diastolic function could not be evaluated.  Right Ventricle: The right ventricular size is normal. No increase in right ventricular wall thickness. Right ventricular systolic function is normal.  Left Atrium: Left atrial size was mildly dilated.  Right Atrium: Right atrial size was normal in size.  Pericardium: There is no evidence of pericardial effusion.  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 No evidence of mitral valve stenosis.  Tricuspid Valve: The tricuspid valve is normal in structure. Tricuspid valve regurgitation is not demonstrated. No evidence of tricuspid stenosis.  Aortic Valve: The aortic valve has an indeterminant number of cusps. Aortic valve regurgitation is trivial. Aortic valve sclerosis/calcification is present, without any evidence of aortic stenosis. Aortic valve mean gradient measures 2.0 mmHg. Aortic valve peak gradient measures 4.0 mmHg. Aortic valve area, by VTI measures 2.43 cm.  Pulmonic Valve: The pulmonic valve was normal in structure. Pulmonic valve regurgitation is not visualized. No evidence of pulmonic stenosis.  Aorta: The aortic root is normal in size and structure.  Venous: The inferior vena cava is normal in size with greater than 50% respiratory variability, suggesting right atrial pressure of 3 mmHg.  IAS/Shunts: No atrial level shunt detected by color flow Doppler.  Additional Comments: 3D was performed not requiring image post processing on an independent workstation and was normal.   LEFT VENTRICLE PLAX 2D LVIDd:         4.81 cm         Diastology LVIDs:         2.65 cm         LV e' medial:    7.40 cm/s LV PW:         0.86 cm          LV E/e' medial:  11.6 LV IVS:        0.77 cm         LV e' lateral:   6.85 cm/s LVOT diam:     2.00 cm         LV E/e' lateral: 12.5 LV SV:         52 LV SV Index:   29 LVOT Area:     3.14 cm        3D Volume EF LV 3D EF:    Left ventricul ar ejection fraction by 3D volume is 67 %.  3D Volume EF: 3D EF:        67 % LV EDV:       142 ml LV ESV:       47 ml LV SV:        95 ml  RIGHT VENTRICLE RV Basal diam:  3.43 cm RV Mid diam:    2.37 cm RV S prime:  12.90 cm/s TAPSE (M-mode): 2.3 cm  LEFT ATRIUM             Index        RIGHT ATRIUM           Index LA diam:        3.70 cm 2.03 cm/m   RA Area:     13.00 cm LA Vol (A2C):   72.3 ml 39.68 ml/m  RA Volume:   27.70 ml  15.20 ml/m LA Vol (A4C):   58.3 ml 31.99 ml/m LA Biplane Vol: 64.8 ml 35.56 ml/m AORTIC VALVE AV Area (Vmax):    2.54 cm AV Area (Vmean):   2.29 cm AV Area (VTI):     2.43 cm AV Vmax:           99.90 cm/s AV Vmean:          69.700 cm/s AV VTI:            0.215 m AV Peak Grad:      4.0 mmHg AV Mean Grad:      2.0 mmHg LVOT Vmax:         80.70 cm/s LVOT Vmean:        50.800 cm/s LVOT VTI:          0.166 m LVOT/AV VTI ratio: 0.77  AORTA Ao Root diam: 2.70 cm Ao Asc diam:  3.40 cm  MITRAL VALVE                  TRICUSPID VALVE MV Area (PHT): 3.60 cm       TR Peak grad:   36.5 mmHg MV Decel Time: 211 msec       TR Vmax:        302.00 cm/s MR Peak grad:    93.3 mmHg MR Mean grad:    64.0 mmHg    SHUNTS MR Vmax:         483.00 cm/s  Systemic VTI:  0.17 m MR Vmean:        391.0 cm/s   Systemic Diam: 2.00 cm MR PISA:         5.09 cm MR PISA Eff ROA: 32 mm MR PISA Radius:  0.90 cm MV E velocity: 85.90 cm/s MV A velocity: 66.00 cm/s MV E/A ratio:  1.30  Kardie Tobb DO Electronically signed by Dub Huntsman DO Signature Date/Time: 09/04/2023/3:11:48 PM    Final   TEE  ECHO TEE 11/22/2023  Narrative TRANSESOPHOGEAL ECHO REPORT    Patient Name:   MYKA HITZ Date of Exam:  11/22/2023 Medical Rec #:  [id number]      Height:       66.5 in Accession #:    [id number]     Weight:       151.4 lb Date of Birth:  1940-04-30      BSA:          1.787 m Patient Age:    83 years       BP:           129/59 mmHg Patient Gender: F              HR:           75 bpm. Exam Location:  Inpatient  Procedure: 3D Echo, Transesophageal Echo, Cardiac Doppler and Limited Color Doppler (Both Spectral and Color Flow Doppler were utilized during procedure).  Indications:     Mitral Valve Regurgitation I34.0  History:  Patient has prior history of Echocardiogram examinations, most recent 09/04/2023. Risk Factors:Dyslipidemia and Former Smoker.  Sonographer:     Koleen Popper RDCS Referring Phys:  [id number] MAUDE JAYSON EMMER Diagnosing Phys: MAUDE EMMER MD  PROCEDURE: After discussion of the risks and benefits of a TEE, an informed consent was obtained from the patient. The transesophogeal probe was passed without difficulty through the esophogus of the patient. Imaged were obtained with the patient in a left lateral decubitus position. Sedation performed by different physician. The patient was monitored while under deep sedation. Anesthestetic sedation was provided intravenously by Anesthesiology: 190mg  of Propofol , 60mg  of Lidocaine . Image quality was good. The patient's vital signs; including heart rate, blood pressure, and oxygen saturation; remained stable throughout the procedure. The patient developed no complications during the procedure.  IMPRESSIONS   1. Left ventricular ejection fraction, by estimation, is 55 to 60%. The left ventricle has normal function. The left ventricle has no regional wall motion abnormalities. 2. Right ventricular systolic function is normal. The right ventricular size is normal. 3. Left atrial size was severely dilated. No left atrial/left atrial appendage thrombus was detected. 4. Right atrial size was moderately dilated. 5. Severe anteriorly  directed MR. Flail segment of the P3 scallop Regurgitant volume 80 cc RF 59%. Posterior leaflet length 15.6 mm. Mean diatolic radient only 3 mmHg with MVA 6.5 cm2. . 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 No evidence of mitral stenosis. 6. 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 7. The aortic valve is tricuspid. There is mild calcification of the aortic valve. There is mild thickening of the aortic valve. Aortic valve regurgitation is mild. Aortic valve sclerosis is present, with no evidence of aortic valve stenosis. 8. The inferior vena cava is normal in size with greater than 50% respiratory variability, suggesting right atrial pressure of 3 mmHg. 9. 3D performed of the mitral valve and 3D performed of the tricuspid valve and demonstrates Abnormal 3D imaging with flail P3 scallop MV and bileaflet prolapse TV.  Conclusion(s)/Recommendation(s): Normal biventricular function without evidence of hemodynamically significant valvular heart disease.  FINDINGS Left Ventricle: Left ventricular ejection fraction, by estimation, is 55 to 60%. The left ventricle has normal function. The left ventricle has no regional wall motion abnormalities. The left ventricular internal cavity size was normal in size. There is no left ventricular hypertrophy.  Right Ventricle: The right ventricular size is normal. Right vetricular wall thickness was not well visualized. Right ventricular systolic function is normal.  Left Atrium: Left atrial size was severely dilated. No left atrial/left atrial appendage thrombus was detected.  Right Atrium: Right atrial size was moderately dilated.  Pericardium: There is no evidence of pericardial effusion.  Mitral Valve: Severe anteriorly directed MR. Flail segment of the P3 scallop Regurgitant volume 80 cc RF 59%. Posterior leaflet length 15.6 mm. Mean diatolic  radient only 3 mmHg with MVA 6.5 cm2. 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 No evidence of mitral valve stenosis. MV peak gradient, 4.2 mmHg. The mean mitral valve gradient is 2.0 mmHg.  Tricuspid Valve: Prolapse of the septal and anterior leaflets. 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 No evidence of tricuspid stenosis.  Aortic Valve: The aortic valve is tricuspid. There is mild calcification of the aortic valve. There is mild thickening of the aortic valve. Aortic valve regurgitation is mild. Aortic valve sclerosis is present, with  no evidence of aortic valve stenosis.  Pulmonic Valve: The pulmonic valve was normal in structure. Pulmonic valve regurgitation is mild. No evidence of pulmonic stenosis.  Aorta: The aortic root is normal in size and structure.  Venous: The inferior vena cava is normal in size with greater than 50% respiratory variability, suggesting right atrial pressure of 3 mmHg.  IAS/Shunts: No atrial level shunt detected by color flow Doppler.  Additional Comments: 3D was performed not requiring image post processing on an independent workstation and was abnormal. Spectral Doppler performed.  LEFT VENTRICLE PLAX 2D LVOT diam:     1.90 cm LVOT Area:     2.84 cm    AORTA Ao Root diam: 2.50 cm Ao Asc diam:  2.90 cm  MITRAL VALVE MV Peak grad: 4.2 mmHg  SHUNTS MV Mean grad: 2.0 mmHg  Systemic Diam: 1.90 cm MV Vmax:      1.03 m/s MV Vmean:     58.2 cm/s  Maude Emmer MD Electronically signed by Maude Emmer MD Signature Date/Time: 11/22/2023/11:42:42 AM    Final    CT SCANS  CT CARDIAC SCORING (SELF PAY ONLY) 05/22/2023  Addendum 06/02/2023 10:38 AM ADDENDUM REPORT: 06/02/2023 10:35  EXAM: OVER-READ INTERPRETATION CT CHEST  The following report is an over-read performed by radiologist Dr. Norman Hopper of [facility] on 06/02/2023. This over-read does not include  interpretation of cardiac or coronary anatomy or pathology. The coronary calcium  score interpretation by the cardiologist is attached.  COMPARISON:  CT chest dated 04/23/2023.  FINDINGS: Cardiovascular: Vascular calcifications are seen in the thoracic aorta. Normal heart size. No pericardial effusion.  Mediastinum/Nodes: No enlarged mediastinal lymph nodes. The visible trachea and esophagus demonstrate no significant findings.  Lungs/Pleura: Emphysematous changes are noted. The patient is status post right middle and right lower lobe wedge resection.  Upper Abdomen: No acute abnormality.  Musculoskeletal: Degenerative changes are seen in the spine. Subacute rib fractures on the right are redemonstrated.  IMPRESSION: No acute findings in the chest.  Aortic Atherosclerosis (ICD10-I70.0) and Emphysema (ICD10-J43.9).   Electronically Signed By: Norman Hopper M.D. On: 06/02/2023 10:35  Narrative CLINICAL DATA:  Cardiovascular Disease Risk stratification  EXAM: Coronary Calcium  Score  TECHNIQUE: A gated, non-contrast computed tomography scan of the heart was performed using 3mm slice thickness. Axial images were analyzed on a dedicated workstation. Calcium  scoring of the coronary arteries was performed using the Agatston method.  FINDINGS: Coronary Calcium  Score:  Left main: 72.6  Left anterior descending artery: 232  Left circumflex artery: 33.1  Right coronary artery: 378  Total: 716  Percentile: 85th  Pericardium: Normal.  Ascending Aorta: 33 mm at the mid ascending aorta measured in an axial plane. Aortic atherosclerosis.  IMPRESSION: 1. Coronary calcium  score of 716. This was 85th percentile for age-, race-, and sex-matched controls.  2.  Aortic atherosclerosis.  3. Non-cardiac: See separate report from [facility].  RECOMMENDATIONS: Coronary artery calcium  (CAC) score is a strong predictor of incident coronary heart disease (CHD) and  provides predictive information beyond traditional risk factors. CAC scoring is reasonable to use in the decision to withhold, postpone, or initiate statin therapy in intermediate-risk or selected borderline-risk asymptomatic adults (age 11-75 years and LDL-C >=70 to <190 mg/dL) who do not have diabetes or established atherosclerotic cardiovascular disease (ASCVD).* In intermediate-risk (10-year ASCVD risk >=7.5% to <20%) adults or selected borderline-risk (10-year ASCVD risk >=5% to <7.5%) adults in whom a CAC score is measured for the purpose of making a treatment decision  the following recommendations have been made:  If CAC=0, it is reasonable to withhold statin therapy and reassess in 5 to 10 years, as long as higher risk conditions are absent (diabetes mellitus, family history of premature CHD in first degree relatives (males <55 years; females <65 years), cigarette smoking, or LDL >=190 mg/dL).  If CAC is 1 to 99, it is reasonable to initiate statin therapy for patients >=66 years of age.  If CAC is >=100 or >=75th percentile, it is reasonable to initiate statin therapy at any age.  Cardiology referral should be considered for patients with CAC scores >=400 or >=75th percentile.  *2018 AHA/ACC/AACVPR/AAPA/ABC/ACPM/ADA/AGS/APhA/ASPC/NLA/PCNA Guideline on the Management of Blood Cholesterol: A Report of the American College of Cardiology/American Heart Association Task Force on Clinical Practice Guidelines. J Am Coll Cardiol. 2019;73(24):3168-3209.  Madonna Large, DO, [facility]  Electronically Signed: By: Madonna Large D.O. On: 05/23/2023 18:48     ______________________________________________________________________________________________      ECG sinus    I have independently reviewed the above radiologic studies and discussed with the patient   Recent Lab Findings: Lab Results  Component Value Date   WBC 5.1 12/06/2023   HGB 12.0 12/06/2023   HCT 38.0  12/06/2023   PLT 233 12/06/2023   GLUCOSE 89 12/06/2023   ALT 19 08/12/2023   AST 22 08/12/2023   NA 140 12/06/2023   K 4.8 12/06/2023   CL 100 12/06/2023   CREATININE 1.02 (H) 12/06/2023   BUN 28 (H) 12/06/2023   CO2 24 12/06/2023   INR 1.07 01/23/2013      Assessment / Plan:   83 y.o. female with severe mitral valve regurgitation.  Based on her age and comorbidities, I do not think that she would be a good candidate for surgical MVR.  She is scheduled to undergo a catheter based repair, and I am in agreement with this plan.     I  spent 30 minutes counseling the patient face to face.   Linnie MALVA Rayas 12/20/2023 12:26 PM

## 2023-12-20 ENCOUNTER — Encounter: Payer: Self-pay | Admitting: Thoracic Surgery (Cardiothoracic Vascular Surgery)

## 2023-12-20 ENCOUNTER — Other Ambulatory Visit: Payer: Self-pay | Admitting: Internal Medicine

## 2023-12-20 ENCOUNTER — Ambulatory Visit
Attending: Thoracic Surgery (Cardiothoracic Vascular Surgery) | Admitting: Thoracic Surgery (Cardiothoracic Vascular Surgery)

## 2023-12-20 VITALS — BP 147/68 | HR 69 | Resp 20 | Ht 66.5 in | Wt 151.0 lb

## 2023-12-20 DIAGNOSIS — I340 Nonrheumatic mitral (valve) insufficiency: Secondary | ICD-10-CM | POA: Diagnosis not present

## 2023-12-20 DIAGNOSIS — R0609 Other forms of dyspnea: Secondary | ICD-10-CM | POA: Diagnosis not present

## 2023-12-21 LAB — CBC WITH DIFFERENTIAL/PLATELET
Basophils Absolute: 0 x10E3/uL (ref 0.0–0.2)
Basos: 1 %
EOS (ABSOLUTE): 0.2 x10E3/uL (ref 0.0–0.4)
Eos: 4 %
Hematocrit: 36.6 % (ref 34.0–46.6)
Hemoglobin: 11.5 g/dL (ref 11.1–15.9)
Immature Grans (Abs): 0 x10E3/uL (ref 0.0–0.1)
Immature Granulocytes: 0 %
Lymphocytes Absolute: 1.1 x10E3/uL (ref 0.7–3.1)
Lymphs: 24 %
MCH: 29.9 pg (ref 26.6–33.0)
MCHC: 31.4 g/dL — ABNORMAL LOW (ref 31.5–35.7)
MCV: 95 fL (ref 79–97)
Monocytes Absolute: 0.4 x10E3/uL (ref 0.1–0.9)
Monocytes: 9 %
Neutrophils Absolute: 3 x10E3/uL (ref 1.4–7.0)
Neutrophils: 62 %
Platelets: 173 x10E3/uL (ref 150–450)
RBC: 3.85 x10E6/uL (ref 3.77–5.28)
RDW: 13 % (ref 11.7–15.4)
WBC: 4.7 x10E3/uL (ref 3.4–10.8)

## 2023-12-21 LAB — COMPREHENSIVE METABOLIC PANEL WITH GFR
ALT: 17 IU/L (ref 0–32)
AST: 30 IU/L (ref 0–40)
Albumin: 4.4 g/dL (ref 3.7–4.7)
Alkaline Phosphatase: 59 IU/L (ref 44–121)
BUN/Creatinine Ratio: 27 (ref 12–28)
BUN: 29 mg/dL — ABNORMAL HIGH (ref 8–27)
Bilirubin Total: 0.3 mg/dL (ref 0.0–1.2)
CO2: 26 mmol/L (ref 20–29)
Calcium: 9.3 mg/dL (ref 8.7–10.3)
Chloride: 100 mmol/L (ref 96–106)
Creatinine, Ser: 1.06 mg/dL — ABNORMAL HIGH (ref 0.57–1.00)
Globulin, Total: 2.6 g/dL (ref 1.5–4.5)
Glucose: 83 mg/dL (ref 70–99)
Potassium: 4.5 mmol/L (ref 3.5–5.2)
Sodium: 140 mmol/L (ref 134–144)
Total Protein: 7 g/dL (ref 6.0–8.5)
eGFR: 52 mL/min/1.73 — ABNORMAL LOW (ref >59)

## 2023-12-21 LAB — PROTIME-INR
INR: 1 (ref 0.9–1.2)
Prothrombin Time: 10.8 s (ref 9.1–12.0)

## 2023-12-21 LAB — PRO B NATRIURETIC PEPTIDE: NT-Pro BNP: 940 pg/mL — ABNORMAL HIGH (ref 0–738)

## 2023-12-23 ENCOUNTER — Ambulatory Visit: Payer: Self-pay | Admitting: Internal Medicine

## 2023-12-24 ENCOUNTER — Telehealth: Payer: Self-pay

## 2023-12-24 ENCOUNTER — Other Ambulatory Visit: Payer: Self-pay

## 2023-12-24 DIAGNOSIS — I340 Nonrheumatic mitral (valve) insufficiency: Secondary | ICD-10-CM

## 2023-12-24 DIAGNOSIS — R0609 Other forms of dyspnea: Secondary | ICD-10-CM

## 2023-12-24 NOTE — Telephone Encounter (Signed)
 Confirmed procedure date of 12/25/2023. Confirmed arrival time of 0630 for procedure time at 0830. Reviewed pre-procedure instructions with patient in detail. She was grateful for call and agreed with plan.   NYHA = II

## 2023-12-25 ENCOUNTER — Inpatient Hospital Stay (HOSPITAL_COMMUNITY): Payer: Self-pay | Admitting: Certified Registered Nurse Anesthetist

## 2023-12-25 ENCOUNTER — Encounter (HOSPITAL_COMMUNITY): Admission: RE | Disposition: E | Payer: Self-pay | Source: Home / Self Care | Attending: Cardiology

## 2023-12-25 ENCOUNTER — Inpatient Hospital Stay (HOSPITAL_COMMUNITY)

## 2023-12-25 ENCOUNTER — Encounter (HOSPITAL_COMMUNITY): Payer: Self-pay | Admitting: Internal Medicine

## 2023-12-25 ENCOUNTER — Inpatient Hospital Stay (HOSPITAL_COMMUNITY)
Admission: RE | Admit: 2023-12-25 | Discharge: 2024-01-15 | DRG: 266 | Disposition: E | Attending: Internal Medicine | Admitting: Internal Medicine

## 2023-12-25 ENCOUNTER — Other Ambulatory Visit: Payer: Self-pay

## 2023-12-25 DIAGNOSIS — Z902 Acquired absence of lung [part of]: Secondary | ICD-10-CM

## 2023-12-25 DIAGNOSIS — D696 Thrombocytopenia, unspecified: Secondary | ICD-10-CM | POA: Diagnosis not present

## 2023-12-25 DIAGNOSIS — I340 Nonrheumatic mitral (valve) insufficiency: Secondary | ICD-10-CM | POA: Diagnosis not present

## 2023-12-25 DIAGNOSIS — I3139 Other pericardial effusion (noninflammatory): Secondary | ICD-10-CM

## 2023-12-25 DIAGNOSIS — N179 Acute kidney failure, unspecified: Secondary | ICD-10-CM | POA: Diagnosis not present

## 2023-12-25 DIAGNOSIS — E871 Hypo-osmolality and hyponatremia: Secondary | ICD-10-CM | POA: Diagnosis not present

## 2023-12-25 DIAGNOSIS — M542 Cervicalgia: Secondary | ICD-10-CM | POA: Diagnosis not present

## 2023-12-25 DIAGNOSIS — N170 Acute kidney failure with tubular necrosis: Secondary | ICD-10-CM | POA: Diagnosis not present

## 2023-12-25 DIAGNOSIS — I071 Rheumatic tricuspid insufficiency: Secondary | ICD-10-CM | POA: Diagnosis present

## 2023-12-25 DIAGNOSIS — Z9889 Other specified postprocedural states: Secondary | ICD-10-CM

## 2023-12-25 DIAGNOSIS — M353 Polymyalgia rheumatica: Secondary | ICD-10-CM | POA: Diagnosis present

## 2023-12-25 DIAGNOSIS — Z7982 Long term (current) use of aspirin: Secondary | ICD-10-CM

## 2023-12-25 DIAGNOSIS — Z66 Do not resuscitate: Secondary | ICD-10-CM | POA: Diagnosis not present

## 2023-12-25 DIAGNOSIS — Z452 Encounter for adjustment and management of vascular access device: Secondary | ICD-10-CM | POA: Diagnosis not present

## 2023-12-25 DIAGNOSIS — Z952 Presence of prosthetic heart valve: Secondary | ICD-10-CM | POA: Diagnosis not present

## 2023-12-25 DIAGNOSIS — R570 Cardiogenic shock: Principal | ICD-10-CM

## 2023-12-25 DIAGNOSIS — I5032 Chronic diastolic (congestive) heart failure: Secondary | ICD-10-CM | POA: Diagnosis present

## 2023-12-25 DIAGNOSIS — E872 Acidosis, unspecified: Secondary | ICD-10-CM | POA: Diagnosis not present

## 2023-12-25 DIAGNOSIS — J432 Centrilobular emphysema: Secondary | ICD-10-CM | POA: Diagnosis not present

## 2023-12-25 DIAGNOSIS — Z87891 Personal history of nicotine dependence: Secondary | ICD-10-CM

## 2023-12-25 DIAGNOSIS — I10 Essential (primary) hypertension: Secondary | ICD-10-CM | POA: Diagnosis present

## 2023-12-25 DIAGNOSIS — Z79899 Other long term (current) drug therapy: Secondary | ICD-10-CM

## 2023-12-25 DIAGNOSIS — E11649 Type 2 diabetes mellitus with hypoglycemia without coma: Secondary | ICD-10-CM | POA: Diagnosis not present

## 2023-12-25 DIAGNOSIS — I314 Cardiac tamponade: Secondary | ICD-10-CM | POA: Diagnosis not present

## 2023-12-25 DIAGNOSIS — R579 Shock, unspecified: Secondary | ICD-10-CM | POA: Diagnosis not present

## 2023-12-25 DIAGNOSIS — I9581 Postprocedural hypotension: Secondary | ICD-10-CM | POA: Diagnosis not present

## 2023-12-25 DIAGNOSIS — J9601 Acute respiratory failure with hypoxia: Secondary | ICD-10-CM

## 2023-12-25 DIAGNOSIS — I309 Acute pericarditis, unspecified: Secondary | ICD-10-CM | POA: Diagnosis not present

## 2023-12-25 DIAGNOSIS — I493 Ventricular premature depolarization: Secondary | ICD-10-CM | POA: Diagnosis present

## 2023-12-25 DIAGNOSIS — R0609 Other forms of dyspnea: Secondary | ICD-10-CM

## 2023-12-25 DIAGNOSIS — J929 Pleural plaque without asbestos: Secondary | ICD-10-CM | POA: Diagnosis not present

## 2023-12-25 DIAGNOSIS — R739 Hyperglycemia, unspecified: Secondary | ICD-10-CM | POA: Diagnosis not present

## 2023-12-25 DIAGNOSIS — K7200 Acute and subacute hepatic failure without coma: Secondary | ICD-10-CM | POA: Diagnosis not present

## 2023-12-25 DIAGNOSIS — K761 Chronic passive congestion of liver: Secondary | ICD-10-CM | POA: Diagnosis not present

## 2023-12-25 DIAGNOSIS — Z8249 Family history of ischemic heart disease and other diseases of the circulatory system: Secondary | ICD-10-CM

## 2023-12-25 DIAGNOSIS — I2510 Atherosclerotic heart disease of native coronary artery without angina pectoris: Secondary | ICD-10-CM

## 2023-12-25 DIAGNOSIS — I6523 Occlusion and stenosis of bilateral carotid arteries: Secondary | ICD-10-CM | POA: Diagnosis present

## 2023-12-25 DIAGNOSIS — D62 Acute posthemorrhagic anemia: Secondary | ICD-10-CM | POA: Diagnosis not present

## 2023-12-25 DIAGNOSIS — Z90710 Acquired absence of both cervix and uterus: Secondary | ICD-10-CM

## 2023-12-25 DIAGNOSIS — J9811 Atelectasis: Secondary | ICD-10-CM | POA: Diagnosis not present

## 2023-12-25 DIAGNOSIS — M810 Age-related osteoporosis without current pathological fracture: Secondary | ICD-10-CM | POA: Diagnosis present

## 2023-12-25 DIAGNOSIS — E875 Hyperkalemia: Secondary | ICD-10-CM | POA: Diagnosis not present

## 2023-12-25 DIAGNOSIS — D649 Anemia, unspecified: Secondary | ICD-10-CM

## 2023-12-25 DIAGNOSIS — E1165 Type 2 diabetes mellitus with hyperglycemia: Secondary | ICD-10-CM | POA: Diagnosis not present

## 2023-12-25 DIAGNOSIS — Z833 Family history of diabetes mellitus: Secondary | ICD-10-CM

## 2023-12-25 DIAGNOSIS — E8722 Chronic metabolic acidosis: Secondary | ICD-10-CM | POA: Diagnosis not present

## 2023-12-25 DIAGNOSIS — Z7902 Long term (current) use of antithrombotics/antiplatelets: Secondary | ICD-10-CM

## 2023-12-25 DIAGNOSIS — R070 Pain in throat: Secondary | ICD-10-CM | POA: Diagnosis not present

## 2023-12-25 DIAGNOSIS — Z95818 Presence of other cardiac implants and grafts: Secondary | ICD-10-CM | POA: Diagnosis not present

## 2023-12-25 DIAGNOSIS — Z955 Presence of coronary angioplasty implant and graft: Secondary | ICD-10-CM

## 2023-12-25 DIAGNOSIS — Q2119 Other specified atrial septal defect: Secondary | ICD-10-CM | POA: Diagnosis not present

## 2023-12-25 DIAGNOSIS — J9690 Respiratory failure, unspecified, unspecified whether with hypoxia or hypercapnia: Secondary | ICD-10-CM | POA: Diagnosis not present

## 2023-12-25 DIAGNOSIS — G9341 Metabolic encephalopathy: Secondary | ICD-10-CM | POA: Diagnosis not present

## 2023-12-25 DIAGNOSIS — Z006 Encounter for examination for normal comparison and control in clinical research program: Secondary | ICD-10-CM | POA: Diagnosis not present

## 2023-12-25 DIAGNOSIS — I110 Hypertensive heart disease with heart failure: Secondary | ICD-10-CM | POA: Diagnosis present

## 2023-12-25 DIAGNOSIS — D72829 Elevated white blood cell count, unspecified: Secondary | ICD-10-CM | POA: Diagnosis not present

## 2023-12-25 DIAGNOSIS — I5082 Biventricular heart failure: Secondary | ICD-10-CM | POA: Diagnosis not present

## 2023-12-25 DIAGNOSIS — Z853 Personal history of malignant neoplasm of breast: Secondary | ICD-10-CM

## 2023-12-25 DIAGNOSIS — Z4682 Encounter for fitting and adjustment of non-vascular catheter: Secondary | ICD-10-CM | POA: Diagnosis not present

## 2023-12-25 DIAGNOSIS — C3490 Malignant neoplasm of unspecified part of unspecified bronchus or lung: Secondary | ICD-10-CM | POA: Diagnosis not present

## 2023-12-25 DIAGNOSIS — N289 Disorder of kidney and ureter, unspecified: Secondary | ICD-10-CM | POA: Diagnosis not present

## 2023-12-25 DIAGNOSIS — J439 Emphysema, unspecified: Secondary | ICD-10-CM | POA: Diagnosis not present

## 2023-12-25 DIAGNOSIS — Z7983 Long term (current) use of bisphosphonates: Secondary | ICD-10-CM

## 2023-12-25 DIAGNOSIS — J90 Pleural effusion, not elsewhere classified: Secondary | ICD-10-CM | POA: Diagnosis not present

## 2023-12-25 DIAGNOSIS — E785 Hyperlipidemia, unspecified: Secondary | ICD-10-CM | POA: Diagnosis present

## 2023-12-25 DIAGNOSIS — Z801 Family history of malignant neoplasm of trachea, bronchus and lung: Secondary | ICD-10-CM

## 2023-12-25 DIAGNOSIS — C3491 Malignant neoplasm of unspecified part of right bronchus or lung: Secondary | ICD-10-CM | POA: Diagnosis present

## 2023-12-25 DIAGNOSIS — E162 Hypoglycemia, unspecified: Secondary | ICD-10-CM | POA: Diagnosis not present

## 2023-12-25 DIAGNOSIS — I051 Rheumatic mitral insufficiency: Secondary | ICD-10-CM

## 2023-12-25 DIAGNOSIS — I959 Hypotension, unspecified: Secondary | ICD-10-CM | POA: Diagnosis not present

## 2023-12-25 DIAGNOSIS — R571 Hypovolemic shock: Secondary | ICD-10-CM | POA: Diagnosis not present

## 2023-12-25 DIAGNOSIS — K7290 Hepatic failure, unspecified without coma: Secondary | ICD-10-CM | POA: Diagnosis not present

## 2023-12-25 DIAGNOSIS — Z01810 Encounter for preprocedural cardiovascular examination: Secondary | ICD-10-CM | POA: Diagnosis not present

## 2023-12-25 DIAGNOSIS — R200 Anesthesia of skin: Secondary | ICD-10-CM | POA: Diagnosis not present

## 2023-12-25 DIAGNOSIS — Z83438 Family history of other disorder of lipoprotein metabolism and other lipidemia: Secondary | ICD-10-CM

## 2023-12-25 DIAGNOSIS — E861 Hypovolemia: Secondary | ICD-10-CM | POA: Diagnosis not present

## 2023-12-25 DIAGNOSIS — Z85118 Personal history of other malignant neoplasm of bronchus and lung: Secondary | ICD-10-CM

## 2023-12-25 DIAGNOSIS — T380X5A Adverse effect of glucocorticoids and synthetic analogues, initial encounter: Secondary | ICD-10-CM | POA: Diagnosis not present

## 2023-12-25 DIAGNOSIS — D689 Coagulation defect, unspecified: Secondary | ICD-10-CM | POA: Diagnosis not present

## 2023-12-25 DIAGNOSIS — Z881 Allergy status to other antibiotic agents status: Secondary | ICD-10-CM

## 2023-12-25 DIAGNOSIS — E7800 Pure hypercholesterolemia, unspecified: Secondary | ICD-10-CM | POA: Diagnosis present

## 2023-12-25 DIAGNOSIS — R202 Paresthesia of skin: Secondary | ICD-10-CM | POA: Diagnosis not present

## 2023-12-25 HISTORY — PX: PERICARDIOCENTESIS: CATH118255

## 2023-12-25 HISTORY — PX: TRANSESOPHAGEAL ECHOCARDIOGRAM (CATH LAB): EP1270

## 2023-12-25 HISTORY — PX: TRANSCATHETER MITRAL EDGE TO EDGE REPAIR: CATH118311

## 2023-12-25 LAB — COMPREHENSIVE METABOLIC PANEL WITH GFR
ALT: 38 U/L (ref 0–44)
AST: 62 U/L — ABNORMAL HIGH (ref 15–41)
Albumin: 3.2 g/dL — ABNORMAL LOW (ref 3.5–5.0)
Alkaline Phosphatase: 34 U/L — ABNORMAL LOW (ref 38–126)
Anion gap: 10 (ref 5–15)
BUN: 30 mg/dL — ABNORMAL HIGH (ref 8–23)
CO2: 18 mmol/L — ABNORMAL LOW (ref 22–32)
Calcium: 7.3 mg/dL — ABNORMAL LOW (ref 8.9–10.3)
Chloride: 111 mmol/L (ref 98–111)
Creatinine, Ser: 1.13 mg/dL — ABNORMAL HIGH (ref 0.44–1.00)
GFR, Estimated: 49 mL/min — ABNORMAL LOW (ref >60)
Glucose, Bld: 198 mg/dL — ABNORMAL HIGH (ref 70–99)
Potassium: 3.6 mmol/L (ref 3.5–5.1)
Sodium: 139 mmol/L (ref 135–145)
Total Bilirubin: 1 mg/dL (ref 0.0–1.2)
Total Protein: 5.7 g/dL — ABNORMAL LOW (ref 6.5–8.1)

## 2023-12-25 LAB — ECHOCARDIOGRAM LIMITED
Height: 66.5 in
Height: 66.5 in
Weight: 2400 [oz_av]
Weight: 2400 [oz_av]

## 2023-12-25 LAB — CBC
HCT: 31.3 % — ABNORMAL LOW (ref 36.0–46.0)
HCT: 33.6 % — ABNORMAL LOW (ref 36.0–46.0)
Hemoglobin: 10 g/dL — ABNORMAL LOW (ref 12.0–15.0)
Hemoglobin: 10.7 g/dL — ABNORMAL LOW (ref 12.0–15.0)
MCH: 31 pg (ref 26.0–34.0)
MCH: 30.8 pg (ref 26.0–34.0)
MCHC: 31.9 g/dL (ref 30.0–36.0)
MCHC: 31.8 g/dL (ref 30.0–36.0)
MCV: 96.9 fL (ref 80.0–100.0)
MCV: 96.8 fL (ref 80.0–100.0)
Platelets: 207 K/uL (ref 150–400)
Platelets: 299 K/uL (ref 150–400)
RBC: 3.23 MIL/uL — ABNORMAL LOW (ref 3.87–5.11)
RBC: 3.47 MIL/uL — ABNORMAL LOW (ref 3.87–5.11)
RDW: 13.2 % (ref 11.5–15.5)
RDW: 13.3 % (ref 11.5–15.5)
WBC: 9.5 K/uL (ref 4.0–10.5)
WBC: 14.2 K/uL — ABNORMAL HIGH (ref 4.0–10.5)
nRBC: 0 % (ref 0.0–0.2)
nRBC: 0 % (ref 0.0–0.2)

## 2023-12-25 LAB — COOXEMETRY PANEL
Carboxyhemoglobin: 1.3 % (ref 0.5–1.5)
Methemoglobin: <0.7 % (ref 0.0–1.5)
O2 Saturation: 58.9 %
Total hemoglobin: 11.5 g/dL — ABNORMAL LOW (ref 12.0–16.0)

## 2023-12-25 LAB — CG4 I-STAT (LACTIC ACID)
Lactic Acid, Venous: 1.2 mmol/L (ref 0.5–1.9)
Lactic Acid, Venous: 1 mmol/L (ref 0.5–1.9)
Lactic Acid, Venous: 1.5 mmol/L (ref 0.5–1.9)

## 2023-12-25 LAB — SURGICAL PCR SCREEN
MRSA, PCR: NEGATIVE
Staphylococcus aureus: NEGATIVE

## 2023-12-25 LAB — TYPE AND SCREEN
ABO/RH(D): O POS
Antibody Screen: NEGATIVE

## 2023-12-25 LAB — POCT ACTIVATED CLOTTING TIME
Activated Clotting Time: 262 s
Activated Clotting Time: 291 s

## 2023-12-25 LAB — ECHO TEE

## 2023-12-25 SURGERY — MITRAL VALVE REPAIR
Anesthesia: General

## 2023-12-25 SURGERY — PERICARDIOCENTESIS
Anesthesia: LOCAL

## 2023-12-25 MED ORDER — FUROSEMIDE 10 MG/ML IJ SOLN
INTRAMUSCULAR | Status: AC
  Filled 2023-12-25: qty 4

## 2023-12-25 MED ORDER — DEXAMETHASONE SODIUM PHOSPHATE 10 MG/ML IJ SOLN
INTRAMUSCULAR | Status: DC | PRN
  Administered 2023-12-25: 10 mg via INTRAVENOUS

## 2023-12-25 MED ORDER — SODIUM CHLORIDE 0.9 % IV BOLUS: 1000.0000 mL | Freq: Once | INTRAVENOUS | Status: AC

## 2023-12-25 MED ORDER — ACETAMINOPHEN 325 MG PO TABS
650.0000 mg | ORAL_TABLET | ORAL | Status: DC | PRN
  Administered 2023-12-28: 650 mg via ORAL
  Filled 2023-12-25: qty 2

## 2023-12-25 MED ORDER — MIDAZOLAM HCL 2 MG/2ML IJ SOLN
INTRAMUSCULAR | Status: AC
  Filled 2023-12-25: qty 2

## 2023-12-25 MED ORDER — LACTATED RINGERS IV SOLN: INTRAVENOUS | Status: DC | PRN

## 2023-12-25 MED ORDER — FAMOTIDINE 20 MG PO TABS
10.0000 mg | ORAL_TABLET | Freq: Every day | ORAL | Status: DC | PRN
  Administered 2023-12-28: 10 mg via ORAL
  Filled 2023-12-25: qty 1

## 2023-12-25 MED ORDER — ROCURONIUM BROMIDE 10 MG/ML (PF) SYRINGE
PREFILLED_SYRINGE | INTRAVENOUS | Status: DC | PRN
  Administered 2023-12-25: 60 mg via INTRAVENOUS

## 2023-12-25 MED ORDER — VITAMIN D 25 MCG (1000 UNIT) PO TABS
1000.0000 [IU] | ORAL_TABLET | Freq: Every day | ORAL | Status: DC
  Administered 2023-12-27: 1000 [IU] via ORAL
  Filled 2023-12-25: qty 1

## 2023-12-25 MED ORDER — PROTAMINE SULFATE 10 MG/ML IV SOLN
INTRAVENOUS | Status: DC | PRN
  Administered 2023-12-25: 30 mg via INTRAVENOUS

## 2023-12-25 MED ORDER — HEPARIN SODIUM (PORCINE) 1000 UNIT/ML IJ SOLN
INTRAMUSCULAR | Status: DC | PRN
  Administered 2023-12-25: 9000 [IU] via INTRAVENOUS

## 2023-12-25 MED ORDER — CHLORHEXIDINE GLUCONATE CLOTH 2 % EX PADS
6.0000 | MEDICATED_PAD | Freq: Every day | CUTANEOUS | Status: DC
  Administered 2023-12-26 – 2023-12-30 (×5): 6 via TOPICAL

## 2023-12-25 MED ORDER — LIDOCAINE HCL (PF) 1 % IJ SOLN
INTRAMUSCULAR | Status: DC | PRN
  Administered 2023-12-25: 10 mL
  Administered 2023-12-25: 5 mL

## 2023-12-25 MED ORDER — ONDANSETRON HCL 4 MG/2ML IJ SOLN
4.0000 mg | Freq: Four times a day (QID) | INTRAMUSCULAR | Status: DC | PRN
  Administered 2023-12-25 – 2023-12-26 (×3): 4 mg via INTRAVENOUS
  Filled 2023-12-25 (×3): qty 2

## 2023-12-25 MED ORDER — FENTANYL CITRATE (PF) 250 MCG/5ML IJ SOLN
INTRAMUSCULAR | Status: DC | PRN
  Administered 2023-12-25: 75 ug via INTRAVENOUS

## 2023-12-25 MED ORDER — SODIUM CHLORIDE 0.9% FLUSH
3.0000 mL | Freq: Two times a day (BID) | INTRAVENOUS | Status: DC
  Administered 2023-12-25 – 2023-12-30 (×11): 3 mL via INTRAVENOUS

## 2023-12-25 MED ORDER — FUROSEMIDE 20 MG PO TABS: 20.0000 mg | ORAL_TABLET | Freq: Two times a day (BID) | ORAL | Status: DC

## 2023-12-25 MED ORDER — FENTANYL CITRATE (PF) 100 MCG/2ML IJ SOLN
INTRAMUSCULAR | Status: AC
  Filled 2023-12-25: qty 2

## 2023-12-25 MED ORDER — EPHEDRINE SULFATE-NACL 50-0.9 MG/10ML-% IV SOSY
PREFILLED_SYRINGE | INTRAVENOUS | Status: DC | PRN
  Administered 2023-12-25: 5 mg via INTRAVENOUS

## 2023-12-25 MED ORDER — CLOPIDOGREL BISULFATE 75 MG PO TABS
75.0000 mg | ORAL_TABLET | Freq: Every day | ORAL | Status: DC
  Administered 2023-12-26 – 2023-12-27 (×2): 75 mg via ORAL
  Filled 2023-12-25 (×2): qty 1

## 2023-12-25 MED ORDER — CHLORHEXIDINE GLUCONATE 4 % EX SOLN: 60.0000 mL | Freq: Once | CUTANEOUS | Status: DC

## 2023-12-25 MED ORDER — SODIUM CHLORIDE 0.9 % IV SOLN: INTRAVENOUS | Status: DC

## 2023-12-25 MED ORDER — NOREPINEPHRINE 4 MG/250ML-% IV SOLN
INTRAVENOUS | Status: DC | PRN
  Administered 2023-12-25: 2 ug/min via INTRAVENOUS

## 2023-12-25 MED ORDER — SODIUM CHLORIDE 0.9 % IV BOLUS: 1000.0000 mL | Freq: Once | INTRAVENOUS | Status: DC

## 2023-12-25 MED ORDER — MORPHINE SULFATE (PF) 2 MG/ML IV SOLN
1.0000 mg | INTRAVENOUS | Status: DC | PRN
  Administered 2023-12-25: 2 mg via INTRAVENOUS
  Filled 2023-12-25 (×2): qty 1

## 2023-12-25 MED ORDER — LABETALOL HCL 5 MG/ML IV SOLN: 10.0000 mg | INTRAVENOUS | Status: DC | PRN

## 2023-12-25 MED ORDER — CHLORHEXIDINE GLUCONATE 0.12 % MT SOLN
15.0000 mL | Freq: Once | OROMUCOSAL | Status: AC
  Administered 2023-12-25: 15 mL via OROMUCOSAL
  Filled 2023-12-25: qty 15

## 2023-12-25 MED ORDER — HYDRALAZINE HCL 20 MG/ML IJ SOLN: 5.0000 mg | INTRAMUSCULAR | Status: DC | PRN

## 2023-12-25 MED ORDER — COLCHICINE 0.6 MG PO TABS
0.6000 mg | ORAL_TABLET | Freq: Two times a day (BID) | ORAL | Status: DC
  Administered 2023-12-25: 0.6 mg via ORAL
  Filled 2023-12-25 (×2): qty 1

## 2023-12-25 MED ORDER — IOHEXOL 350 MG/ML SOLN
INTRAVENOUS | Status: DC | PRN
  Administered 2023-12-25: 3 mL

## 2023-12-25 MED ORDER — MIDAZOLAM HCL 2 MG/2ML IJ SOLN
INTRAMUSCULAR | Status: DC | PRN
  Administered 2023-12-25: 1 mg via INTRAVENOUS

## 2023-12-25 MED ORDER — LIDOCAINE 2% (20 MG/ML) 5 ML SYRINGE
INTRAMUSCULAR | Status: DC | PRN
  Administered 2023-12-25: 60 mg via INTRAVENOUS

## 2023-12-25 MED ORDER — NOREPINEPHRINE BITARTRATE 1 MG/ML IV SOLN
INTRAVENOUS | Status: DC | PRN
  Administered 2023-12-25: .2 mL via INTRAVENOUS

## 2023-12-25 MED ORDER — HEPARIN (PORCINE) IN NACL 2000-0.9 UNIT/L-% IV SOLN
INTRAVENOUS | Status: DC | PRN
  Administered 2023-12-25: 3500 mL

## 2023-12-25 MED ORDER — SODIUM CHLORIDE 0.9% FLUSH: 3.0000 mL | INTRAVENOUS | Status: DC | PRN

## 2023-12-25 MED ORDER — FENTANYL CITRATE PF 50 MCG/ML IJ SOSY
25.0000 ug | PREFILLED_SYRINGE | Freq: Once | INTRAMUSCULAR | Status: AC
  Administered 2023-12-25: 25 ug via INTRAVENOUS
  Filled 2023-12-25: qty 1

## 2023-12-25 MED ORDER — NOREPINEPHRINE 4 MG/250ML-% IV SOLN
INTRAVENOUS | Status: AC
  Administered 2023-12-25: 2 ug/min via INTRAVENOUS
  Filled 2023-12-25: qty 250

## 2023-12-25 MED ORDER — MELATONIN 3 MG PO TABS
1.5000 mg | ORAL_TABLET | Freq: Every day | ORAL | Status: DC
Start: 2023-12-25 — End: 2023-12-27
  Filled 2023-12-25: qty 1
  Filled 2023-12-25: qty 0.5

## 2023-12-25 MED ORDER — NITROGLYCERIN 0.4 MG SL SUBL: 0.4000 mg | SUBLINGUAL_TABLET | SUBLINGUAL | Status: DC | PRN

## 2023-12-25 MED ORDER — LIDOCAINE HCL (PF) 1 % IJ SOLN
INTRAMUSCULAR | Status: AC
  Filled 2023-12-25: qty 30

## 2023-12-25 MED ORDER — AMLODIPINE BESYLATE 5 MG PO TABS: 2.5000 mg | ORAL_TABLET | Freq: Every day | ORAL | Status: DC

## 2023-12-25 MED ORDER — SUGAMMADEX SODIUM 200 MG/2ML IV SOLN
INTRAVENOUS | Status: DC | PRN
  Administered 2023-12-25: 136 mg via INTRAVENOUS

## 2023-12-25 MED ORDER — CEFAZOLIN SODIUM-DEXTROSE 2-4 GM/100ML-% IV SOLN
2.0000 g | INTRAVENOUS | Status: AC
  Administered 2023-12-25: 2 g via INTRAVENOUS
  Filled 2023-12-25: qty 100

## 2023-12-25 MED ORDER — ATORVASTATIN CALCIUM 10 MG PO TABS
10.0000 mg | ORAL_TABLET | ORAL | Status: DC
Start: 2023-12-27 — End: 2023-12-28
  Administered 2023-12-27: 10 mg via ORAL
  Filled 2023-12-25: qty 1

## 2023-12-25 MED ORDER — ALPRAZOLAM 0.25 MG PO TABS
0.2500 mg | ORAL_TABLET | Freq: Every day | ORAL | Status: DC | PRN
Start: 2023-12-25 — End: 2023-12-28

## 2023-12-25 MED ORDER — NOREPINEPHRINE 4 MG/250ML-% IV SOLN
0.0000 ug/min | INTRAVENOUS | Status: DC
  Administered 2023-12-25: 9 ug/min via INTRAVENOUS
  Administered 2023-12-26: 19 ug/min via INTRAVENOUS
  Administered 2023-12-26: 8 ug/min via INTRAVENOUS
  Administered 2023-12-27: 10 ug/min via INTRAVENOUS
  Administered 2023-12-27: 2 ug/min via INTRAVENOUS
  Administered 2023-12-28: 6 ug/min via INTRAVENOUS
  Administered 2023-12-29: 30 ug/min via INTRAVENOUS
  Filled 2023-12-25 (×9): qty 250

## 2023-12-25 MED ORDER — ONDANSETRON HCL 4 MG/2ML IJ SOLN
INTRAMUSCULAR | Status: DC | PRN
  Administered 2023-12-25: 4 mg via INTRAVENOUS

## 2023-12-25 MED ORDER — FENTANYL CITRATE (PF) 100 MCG/2ML IJ SOLN
INTRAMUSCULAR | Status: DC | PRN
  Administered 2023-12-25: 25 ug via INTRAVENOUS

## 2023-12-25 MED ORDER — METHOCARBAMOL 500 MG PO TABS
500.0000 mg | ORAL_TABLET | Freq: Three times a day (TID) | ORAL | Status: DC | PRN
Start: 2023-12-25 — End: 2023-12-26

## 2023-12-25 MED ORDER — PROPOFOL 10 MG/ML IV BOLUS
INTRAVENOUS | Status: DC | PRN
  Administered 2023-12-25: 120 mg via INTRAVENOUS

## 2023-12-25 MED ORDER — CLEVIDIPINE BUTYRATE 0.5 MG/ML IV EMUL
INTRAVENOUS | Status: DC | PRN
  Administered 2023-12-25: 1.5 mg/h via INTRAVENOUS

## 2023-12-25 MED ORDER — HYDROMORPHONE HCL 1 MG/ML IJ SOLN
1.0000 mg | INTRAMUSCULAR | Status: DC | PRN
  Administered 2023-12-25 – 2023-12-26 (×5): 1 mg via INTRAVENOUS
  Filled 2023-12-25 (×5): qty 1

## 2023-12-25 MED ORDER — SODIUM CHLORIDE 0.9 % IV SOLN: 250.0000 mL | INTRAVENOUS | Status: DC | PRN

## 2023-12-25 MED ORDER — AZELASTINE HCL 0.1 % NA SOLN: 1.0000 | Freq: Two times a day (BID) | NASAL | Status: DC | PRN

## 2023-12-25 MED ORDER — MORPHINE SULFATE (PF) 2 MG/ML IV SOLN
2.0000 mg | Freq: Once | INTRAVENOUS | Status: AC
  Administered 2023-12-25: 2 mg via INTRAVENOUS

## 2023-12-25 MED ORDER — ALBUMIN HUMAN 5 % IV SOLN
INTRAVENOUS | Status: AC
  Filled 2023-12-25: qty 250

## 2023-12-25 MED ORDER — ASPIRIN 81 MG PO TBEC
81.0000 mg | DELAYED_RELEASE_TABLET | Freq: Every day | ORAL | Status: DC
  Administered 2023-12-26: 81 mg via ORAL
  Filled 2023-12-25: qty 1

## 2023-12-25 MED ORDER — OMEGA-3 KRILL OIL 500 MG PO CAPS: 500.0000 mg | ORAL_CAPSULE | Freq: Every day | ORAL | Status: DC

## 2023-12-25 MED ORDER — ESCITALOPRAM OXALATE 10 MG PO TABS
5.0000 mg | ORAL_TABLET | Freq: Every day | ORAL | Status: DC
  Administered 2023-12-27: 5 mg via ORAL
  Filled 2023-12-25: qty 1

## 2023-12-25 MED ORDER — SODIUM CHLORIDE 0.9 % IV BOLUS
1000.0000 mL | Freq: Once | INTRAVENOUS | Status: AC
  Administered 2023-12-25: 1000 mL via INTRAVENOUS

## 2023-12-25 SURGICAL SUPPLY — 19 items
CLOSURE PERCLOSE PROSTYLE (VASCULAR PRODUCTS) IMPLANT
COVER TABLE BACK 60X90 (DRAPES) IMPLANT
ELECT DEFIB PAD ADLT CADENCE (PAD) IMPLANT
KIT HEART LEFT (KITS) ×4 IMPLANT
KIT MICROPUNCTURE NIT STIFF (SHEATH) IMPLANT
KIT VERSACROSS RF TRANS 67 (KITS) IMPLANT
PACK CARDIAC CATHETERIZATION (CUSTOM PROCEDURE TRAY) ×2 IMPLANT
PAD SORBX EP SHIELD 16.5X12 (MISCELLANEOUS) IMPLANT
RAIL STABILIZER SYS PASCAL (SYSTAGENIX WOUND MANAGEMENT) IMPLANT
SHEATH DILAT COONS TAPER 22F (SHEATH) IMPLANT
SHEATH PASCAL GUIDE 22F (SHEATH) IMPLANT
SHEATH PINNACLE 8F 10CM (SHEATH) IMPLANT
SHEATH PROBE COVER 6X72 (BAG) ×2 IMPLANT
STOPCOCK MORSE 400PSI 3WAY (MISCELLANEOUS) ×12 IMPLANT
SYSTEM IMPL PSCL ACE PRECISION (Prosthesis & Implant Heart) IMPLANT
SYSTEM PASCAL MITRAL REPAIR (SYSTAGENIX WOUND MANAGEMENT) IMPLANT
TRANSDUCER W/STOPCOCK (MISCELLANEOUS) ×2 IMPLANT
TUBING ART PRESS 72 MALE/FEM (TUBING) ×2 IMPLANT
WIRE EMERALD 3MM-J .035X150CM (WIRE) IMPLANT

## 2023-12-25 SURGICAL SUPPLY — 6 items
EVACUATOR 1/8 PVC DRAIN (DRAIN) IMPLANT
PACK CARDIAC CATHETERIZATION (CUSTOM PROCEDURE TRAY) IMPLANT
SET ATX-X65L (MISCELLANEOUS) IMPLANT
TRAY PERICARDIOCENTESIS 6FX60 (TRAY / TRAY PROCEDURE) IMPLANT
WIRE EMERALD 3MM-J .035X260CM (WIRE) IMPLANT
WIRE MICRO SET SILHO 5FR 7 (SHEATH) IMPLANT

## 2023-12-25 NOTE — Anesthesia Preprocedure Evaluation (Signed)
 Anesthesia Evaluation  Patient identified by MRN, date of birth, ID band Patient awake    Reviewed: Allergy & Precautions, NPO status , Patient's Chart, lab work & pertinent test results  History of Anesthesia Complications Negative for: history of anesthetic complications  Airway Mallampati: III  TM Distance: >3 FB Neck ROM: Full    Dental  (+) Dental Advisory Given, Teeth Intact, Implants   Pulmonary shortness of breath, neg sleep apnea, neg COPD, neg recent URI, former smoker   breath sounds clear to auscultation       Cardiovascular hypertension, Pt. on medications + CAD and + Cardiac Stents   Rhythm:Regular + Systolic murmurs   Prox RCA lesion is 90% stenosed.   Mid RCA lesion is 80% stenosed.   Ost RCA to Prox RCA lesion is 70% stenosed.   A stent was successfully placed.   A stent was successfully placed.   A stent was successfully placed.   Post intervention, there is a 0% residual stenosis.   Post intervention, there is a 0% residual stenosis.   Post intervention, there is a 0% residual stenosis.   1.  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  Due to high risk stress test, complex PCI of the right coronary artery was pursued requiring GuideLiner, multiple wires, and the implantation of 3 drug-eluting stents placed from the ostium to the distal RCA. 2.  Mild, nonobstructive disease of the LAD and left circumflex. 3.  Fick cardiac output of 5.76 L/min and Fick cardiac index of 3.22 L/min/m with the following hemodynamics:            Right atrial pressure mean of 8 mmHg with C V waves to 12 mmHg            RV 41/5 with end-diastolic pressure 12 mmHg            Wedge pressure mean of 18 mmHg with V waves to 28 mmHg            PA pressure 40/16 with a mean of 27 mmHg            PVR of 1.6 Woods units            PA pulsatility index of 3 4.  LVEDP of 27 mmHg   Recommendation: Dual antiplatelet  therapy with aspirin  and Plavix  for 6 months followed by Plavix  monotherapy indefinitely.  Start Lasix  20 mg twice daily.  Same-day discharge for PCI after 6 hours of bedrest/monitoring.     1. Left ventricular ejection fraction, by estimation, is 55 to 60%. The  left ventricle has normal function. The left ventricle has no regional  wall motion abnormalities.   2. Right ventricular systolic function is normal. The right ventricular  size is normal.   3. Left atrial size was severely dilated. No left atrial/left atrial  appendage thrombus was detected.   4. Right atrial size was moderately dilated.   5. Severe anteriorly directed MR. Flail segment of the P3 scallop  Regurgitant volume 80 cc RF 59%. Posterior leaflet length 15.6 mm. Mean  diatolic radient only 3 mmHg with MVA 6.5 cm2. . The mitral valve is  myxomatous. Severe mitral valve regurgitation.   No evidence of mitral stenosis.   6. 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   7. The aortic valve is tricuspid. There is mild calcification of the  aortic valve. There is  mild thickening of the aortic valve. Aortic valve  regurgitation is mild. Aortic valve sclerosis is present, with no evidence  of aortic valve stenosis.   8. The inferior vena cava is normal in size with greater than 50%  respiratory variability, suggesting right atrial pressure of 3 mmHg.   9. 3D performed of the mitral valve and 3D performed of the tricuspid  valve and demonstrates Abnormal 3D imaging with flail P3 scallop MV and  bileaflet prolapse TV.      Neuro/Psych negative neurological ROS  negative psych ROS   GI/Hepatic Neg liver ROS,GERD  Controlled,,  Endo/Other  negative endocrine ROS    Renal/GU Lab Results      Component                Value               Date                      NA                       140                 12/20/2023                 K                        4.5                 12/20/2023                CO2                      26                  12/20/2023                GLUCOSE                  83                  12/20/2023                BUN                      29 (H)              12/20/2023                CREATININE               1.06 (H)            12/20/2023                CALCIUM                   9.3                 12/20/2023                EGFR                     52 (L)              12/20/2023                GFRNONAA                 >  60                 02/08/2023                Musculoskeletal  (+) Arthritis ,    Abdominal   Peds  Hematology  (+) Blood dyscrasia Lab Results      Component                Value               Date                      WBC                      4.7                 12/20/2023                HGB                      11.5                12/20/2023                HCT                      36.6                12/20/2023                MCV                      95                  12/20/2023                PLT                      173                 12/20/2023              Anesthesia Other Findings   Reproductive/Obstetrics                              Anesthesia Physical Anesthesia Plan  ASA: 3  Anesthesia Plan: General   Post-op Pain Management: Minimal or no pain anticipated   Induction: Intravenous  PONV Risk Score and Plan: 3 and Ondansetron  and Dexamethasone   Airway Management Planned: Oral ETT  Additional Equipment: Arterial line  Intra-op Plan:   Post-operative Plan: Extubation in OR  Informed Consent: I have reviewed the patients History and Physical, chart, labs and discussed the procedure including the risks, benefits and alternatives for the proposed anesthesia with the patient or authorized representative who has indicated his/her understanding and acceptance.     Dental advisory given  Plan Discussed with:  CRNA  Anesthesia Plan Comments:         Anesthesia Quick Evaluation

## 2023-12-25 NOTE — H&P (View-Only) (Signed)
 Advanced Heart Failure Team Consult Note   Primary Physician: Melinda Harvey, MD Cardiologist:  [name] Thukkani, MD  Reason for Consultation: Mitral Clip/Pericardial Effusion   HPI:    [name] is seen today for evaluation of M at the request of Dr [name].   [name] is a 83 year old with history of mitral valve disease, carotid disease, breast cancer treated with resection, and CAD with DES to RCA 11/22/2023. She had been on DAPT.   High risk stress test. Referred to Dr [name]. She was set up for cath and TEE.  TEE - EF 55-60% Severe MR noted.  Cath 11/22/2023- Post cath placed on lasix  20 mg twice a day+ DAPT ASA & Plavix .  RCA 3 DES  Mild nonobstructive CAD LAD/left circumflex. Fick cardiac output of 5.76 Rivera/min and Fick cardiac index of 3.22 Rivera/min/m with the following hemodynamics:            Right atrial pressure mean of 8 mmHg with C V waves to 12 mmHg            RV 41/5 with end-diastolic pressure 12 mmHg            Wedge pressure mean of 18 mmHg with V waves to 28 mmHg            PA pressure 40/16 with a mean of 27 mmHg            PVR of 1.6 Woods units            PA pulsatility index of 3  Admitted with scheduled mitral clip. Post procedure she developed hypotension, acute shortness of breath,  and chest pain. Given albumin . AMS reported in recovery room. Stat Echo with large pericardial effusion. S/P Pericardiocentesis with 350 cc removed. On arrival to ICU SBP 120s Map 70s. Arterial line in place with good waveform.  She was turned and developed hypotension with Map 40-50s. Alert and oriented.    Home Medications Prior to Admission medications   Medication Sig Start Date End Date Taking? Authorizing Provider  ALPRAZolam  (XANAX ) 0.25 MG tablet Take 0.25 mg by mouth daily as needed for anxiety (prior to air travel or dental appointments). 09/04/16  Yes [provider]  amLODipine  (NORVASC ) 2.5 MG tablet Take 1 tablet (2.5 mg total) by mouth daily. 12/06/23   Yes Rivera, Melinda L, NP  aspirin  EC 81 MG tablet Take 1 tablet (81 mg total) by mouth daily. Swallow whole. 11/22/23  Yes Rivera, Melinda K, MD  atorvastatin  (LIPITOR) 10 MG tablet TAKE 1 TABLET (10 MG TOTAL) BY MOUTH 3 (THREE) TIMES A WEEK. TAKES M-W-F. 11/01/23  Yes Rivera, Melinda CHRISTELLA, NP  azelastine  (ASTELIN ) 0.1 % nasal spray Place 1 spray into both nostrils 2 (two) times daily as needed for rhinitis or allergies. 10/24/23  Yes [provider]  Bempedoic Acid  (NEXLETOL ) 180 MG TABS Take 1 tablet (180 mg total) by mouth daily. 11/11/23  Yes Rivera, Melinda CHRISTELLA, NP  cholecalciferol  (VITAMIN D3) 25 MCG (1000 UNIT) tablet Take 1,000 Units by mouth daily.   Yes [provider]  clopidogrel  (PLAVIX ) 75 MG tablet Take 1 tablet (75 mg total) by mouth daily. 12/06/23  Yes Rivera, Melinda L, NP  escitalopram  (LEXAPRO ) 5 MG tablet Take 5 mg by mouth daily.   Yes [provider]  fluticasone (FLONASE ALLERGY RELIEF) 50 MCG/ACT nasal spray Place 1 spray into both nostrils daily as needed for allergies or rhinitis. 04/25/18  Yes  [provider]  furosemide  (LASIX ) 20 MG tablet Take 1 tablet (20 mg total) by mouth 2 (two) times daily. 12/06/23 12/05/24 Yes Rivera, Melinda CROME, NP  linaclotide (LINZESS) 72 MCG capsule Take 72 mcg by mouth daily before breakfast. Per patient skips taking it when stomach is upset. Patient taking differently: Take 72 mcg by mouth as needed. Per patient skips taking it when stomach is upset. 01/26/19  Yes [provider]  meclizine  (ANTIVERT ) 25 MG tablet Take 25 mg by mouth 3 (three) times daily as needed for dizziness.  12/24/12  Yes [provider]  Melatonin 3 MG TBDP Take 1.5 mg by mouth at bedtime.   Yes [provider]  Multiple Vitamin (MULTIVITAMIN WITH MINERALS) TABS tablet Take 1 tablet by mouth every other day.   Yes [provider]  Omega-3 Krill Oil 500 MG CAPS Take 500 mg by mouth daily.   Yes  [provider]  Polyvinyl Alcohol -Povidone (REFRESH OP) Place 1 drop into both eyes every 6 (six) hours as needed (dry eyes).   Yes [provider]  denosumab  (PROLIA ) 60 MG/ML SOSY injection Inject 60 mg into the skin every 6 (six) months.    [provider]  famotidine  (PEPCID  AC) 10 MG tablet Take 10 mg by mouth daily as needed for heartburn or indigestion.    [provider]  meloxicam  (MOBIC ) 15 MG tablet Take 15 mg by mouth daily as needed for pain.  12/22/12   [provider]  methocarbamol  (ROBAXIN ) 500 MG tablet Take 500 mg by mouth every 8 (eight) hours as needed for muscle spasms.    [provider]  nitroGLYCERIN  (NITROSTAT ) 0.4 MG SL tablet Place 1 tablet (0.4 mg total) under the tongue every 5 (five) minutes as needed for chest pain. 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 Wendel    Past Surgical History: Past Surgical History:  Procedure Laterality Date   ABDOMINAL HYSTERECTOMY     partial   APPENDECTOMY     BREAST EXCISIONAL BIOPSY Bilateral    benign   BREAST SURGERY Bilateral    bx    CORONARY STENT INTERVENTION N/A 11/22/2023   Procedure: CORONARY STENT INTERVENTION;  Surgeon: Rivera, Melinda K, MD;  Location: [facility] INVASIVE CV LAB;  Service: Cardiovascular;  Laterality: N/A;   EYE SURGERY Bilateral    cataracts   LUNG REMOVAL, PARTIAL Right 01/26/2013   for Stage IA non small cell   RIGHT/LEFT HEART CATH AND CORONARY ANGIOGRAPHY N/A 11/22/2023   Procedure: RIGHT/LEFT HEART CATH AND CORONARY ANGIOGRAPHY;  Surgeon:  [name] Lurena POUR, MD;  Location: [facility] INVASIVE CV LAB;  Service: Cardiovascular;  Laterality: N/A;   TONSILLECTOMY     TRANSESOPHAGEAL ECHOCARDIOGRAM (CATH LAB) N/A 11/22/2023   Procedure: TRANSESOPHAGEAL ECHOCARDIOGRAM;  Surgeon: Delford Maude BROCKS, MD;  Location[facility] INVASIVE CV LAB;  Service: Cardiovascular;  Laterality: N/A;   VIDEO ASSISTED THORACOSCOPY (VATS)/WEDGE RESECTION Right 01/26/2013   Procedure: VIDEO ASSISTED THORACOSCOPY (VATS) with wedge resection right middle lobe  nodule , segmental resection rigth lower lobe lung, lymph node sampling,;  Surgeon: Elspeth JAYSON Millers, MD;  Location: [facility] OR;  Service: Thoracic;  Laterality: Right;    Family History: Family History  Problem Relation Age of Onset   Lung cancer Father    Diabetes Mellitus II Father    Hypertension Father    Hyperlipidemia Father    Cancer - Other Brother     Social History: Social History   Socioeconomic History   Marital status: Divorced    Spouse name: Not on file   Number of children: Not on file   Years of education: Not on file   Highest education level: Not on file  Occupational History   Not on file  Tobacco Use   Smoking status: Former    Current packs/day: 0.00    Average packs/day: 1 pack/day for 40.0 years (40.0 ttl pk-yrs)    Types: Cigarettes    Start date: 04/16/1965    Quit date: 04/16/2005    Years since quitting: 18.7   Smokeless tobacco: Never  Substance and Sexual Activity   Alcohol  use: No   Drug use: No   Sexual activity: Not on file  Other Topics Concern   Not on file  Social History Narrative   Not on file   Social Drivers of Health   Financial Resource Strain: Not on file  Food Insecurity: Not on file  Transportation Needs: Not on file  Physical Activity: Not on file  Stress: Not on file  Social Connections: Not on file    Allergies:  Allergies  Allergen Reactions   Doxycycline Other (See Comments)    Upset stomach   Erythromycin Other (See Comments)    Upset  stomach    Objective:    Vital Signs:   Temp:  [97.8 F (36.6 C)] 97.8 F (36.6 C) (09/10 0711) Pulse Rate:  [54-134] 93 (09/10 1209) Resp:  [10-26] 18 (09/10 1209) BP: (49-172)/(31-84) 120/84 (09/10 1209) SpO2:  [93 %-100 %] 97 % (09/10 1209) Weight:  [68 kg] 68 kg (09/10 0711)    Weight change: Filed Weights   12/25/23 0711  Weight: 68 kg    Intake/Output:   Intake/Output Summary (Last 24 hours) at 12/25/2023 1241 Last data filed at 12/25/2023 1030 Gross per 24 hour  Intake 700 ml  Output --  Net 700 ml      Physical Exam   General:  Pale. Appears weak.  Neck: no JVD.  Cor: Regular rate & rhythm. Pericardial Drain . Lungs: clear Abdomen: soft, nontender, nondistended.  Extremities:  Groin site ok. No hematoma. no  edema. Cool extremities.  Neuro: alert & oriented x3   Telemetry   SR  90s with occasional PVCs.   EKG      Labs   Basic Metabolic Panel: Recent Labs  Lab 12/20/23 1343  NA 140  Rivera 4.5  CL 100  CO2 26  GLUCOSE 83  BUN 29*  CREATININE 1.06*  CALCIUM  9.3    Liver Function Tests: Recent Labs  Lab 12/20/23 1343  AST 30  ALT 17  ALKPHOS 59  BILITOT 0.3  PROT 7.0  ALBUMIN  4.4   No results for input(s): LIPASE, AMYLASE in the last 168 hours. No results for input(s): AMMONIA in the last 168 hours.  CBC: Recent Labs  Lab 12/20/23 1343  WBC 4.7  NEUTROABS 3.0  HGB 11.5  HCT 36.6  MCV 95  PLT 173    Cardiac Enzymes: No results for input(s):  CKTOTAL, CKMB, CKMBINDEX, TROPONINI in the last 168 hours.  BNP: BNP (last 3 results) No results for input(s): BNP in the last 8760 hours.  ProBNP (last 3 results) Recent Labs    12/20/23 1343  PROBNP 940*     CBG: No results for input(s): GLUCAP in the last 168 hours.  Coagulation Studies: No results for input(s): LABPROT, INR in the last 72 hours.   Imaging   ECHO TEE Result Date: 12/25/2023    TRANSESOPHOGEAL ECHO REPORT   Patient Name:    DONNIS PECHA Date of Exam: 12/25/2023 Medical Rec #:  [id number]      Height:       66.5 in Accession #:    [id number]     Weight:       150.0 lb Date of Birth:  30-Jan-1941      BSA:          1.780 m Patient Age:    83 years       BP:           144/57 mmHg Patient Gender: F              HR:           67 bpm. Exam Location:  Inpatient Procedure: Transesophageal Echo, 3D Echo, Color Doppler and Cardiac Doppler            (Both Spectral and Color Flow Doppler were utilized during            procedure). Indications:     Mitral Regurgitation i34.0  History:         Patient has prior history of Echocardiogram examinations, most                  recent 11/22/2023. CAD; Risk Factors:Hypertension.  Sonographer:     Damien Senior RDCS Referring Phys:  [id number] [name] Diagnosing Phys: Maude Emmer MD PROCEDURE: After discussion of the risks and benefits of a TEE, an informed consent was obtained from the patient. The transesophogeal probe was passed without difficulty through the esophogus of the patient. Sedation performed by different physician. The patient developed no complications during the procedure. Patient under general anesthesia for clip implantation.  IMPRESSIONS  1. Left ventricular ejection fraction, by estimation, is 60 to 65%. The left ventricle has normal function.  2. Right ventricular systolic function is normal. The right ventricular size is normal.  3. Left atrial size was severely dilated. No left atrial/left atrial appendage thrombus was detected.  4. Right atrial size was moderately dilated.  5. Extensive 3D imaging of MV done to guide mTEER. Pirior to intervention P3 flail with severe MR. Total reversal of upper PVls , ERO 0.88 cm2 and RV 135 cc Post mTEER Two successfully placed ACE devices Good 3D bridge. Trivial MR seen between the two clips. Mean gradient 7 peak 11 mmHg at HR of 66 bpm. . 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  6. Prolapse of septal and posterior  leaflets by 3D imaging. The tricuspid valve is abnormal. 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  7. The aortic valve is tricuspid. There is mild calcification of the aortic valve. Aortic valve regurgitation is mild. Aortic valve sclerosis is present, with no evidence of aortic valve stenosis.  8. PFO present prior to trans septal puncture. Post trans septal puncture only left to right shunting.  9. 3D performed of the mitral valve and 3D performed of the tricuspid valve and demonstrates  Used to guide mTEER procedure and image TV. 10. Patient under general anesthesia for clip implantation. FINDINGS  Left Ventricle: Left ventricular ejection fraction, by estimation, is 60 to 65%. The left ventricle has normal function. The left ventricular internal cavity size was normal in size. Right Ventricle: The right ventricular size is normal. Right vetricular wall thickness was not assessed. Right ventricular systolic function is normal. Left Atrium: Left atrial size was severely dilated. No left atrial/left atrial appendage thrombus was detected. Right Atrium: Right atrial size was moderately dilated. Pericardium: There is no evidence of pericardial effusion. Mitral Valve: Extensive 3D imaging of MV done to guide mTEER. Pirior to intervention P3 flail with severe MR. Total reversal of upper PVls , ERO 0.88 cm2 and RV 135 cc Post mTEER Two successfully placed ACE devices Good 3D bridge. Trivial MR seen between  the two clips. Mean gradient 7 peak 11 mmHg at HR of 66 bpm. 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 Tricuspid Valve: Prolapse of septal and posterior leaflets by 3D imaging. The tricuspid valve is abnormal. 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 Aortic Valve: The aortic valve is tricuspid. There is mild calcification of the aortic valve. Aortic valve regurgitation is mild. Aortic valve sclerosis is present, with no evidence of aortic valve stenosis. Pulmonic Valve:  The pulmonic valve was normal in structure. Pulmonic valve regurgitation is mild. Aorta: The aortic root is normal in size and structure. IAS/Shunts: PFO present prior to trans septal puncture. Post trans septal puncture only left to right shunting. Additional Comments: 3D was performed not requiring image post processing on an independent workstation and was abnormal. Spectral Doppler performed. Maude Emmer MD Electronically signed by Maude Emmer MD Signature Date/Time: 12/25/2023/10:49:23 AM    Final    EP STUDY Result Date: 12/25/2023 See surgical note for result.  Structural Heart Procedure Result Date: 12/25/2023 See surgical note for result.  DG Chest 2 View Result Date: 12/25/2023 CLINICAL DATA:  Preop for mitral valve repair.  Dyspnea on exertion. EXAM: CHEST - 2 VIEW COMPARISON:  February 08, 2023. FINDINGS: The heart size and mediastinal contours are within normal limits. Minimal right basilar scarring is noted. No definite acute pulmonary abnormality is noted. Old right rib fractures are again noted. IMPRESSION: No active cardiopulmonary disease. Electronically Signed   By: Lynwood Landy Raddle M.D.   On: 12/25/2023 08:30     Medications:     Current Medications:  [START ON 12/26/2023] amLODipine   2.5 mg Oral Daily   [START ON 12/26/2023] aspirin  EC  81 mg Oral Daily   [START ON 12/27/2023] atorvastatin   10 mg Oral Once per day on Monday Wednesday Friday   [START ON 12/26/2023] cholecalciferol   1,000 Units Oral Daily   [START ON 12/26/2023] clopidogrel   75 mg Oral Daily   [START ON 12/26/2023] escitalopram   5 mg Oral Daily   furosemide   20 mg Oral BID   melatonin  1.5 mg Oral QHS   sodium chloride  flush  3 mL Intravenous Q12H    Infusions:  sodium chloride      albumin  human        Patient Profile   Admitted to ICU post Mitral Clip complicated by Pericardial Effusion and shock.   Assessment/Plan   1. Shock, ?Hypovolemia  ? Pericardial Effusion  Pericardial Effusion post  mitral clip. Urgent Pericardiocentesis with 350 cc of bloody output. Adding colchicine  0.6 mg twice a day.  POCUS ? Under filled. Give 1 Liter NS now. She then  developed hypotension. Added Norepi.  Central line placed. Check CVP/CO-OX  Check stat cbc. Lactic acid 1.1.  Obtain stat ECHO   2. Mitral Valve Diseease, S/P MTEER , Clip x2.  See above.   3. CAD 11/22/23 DES RCA. Last dose of aspirin /plavix  earlier this morning.   Check BMET/CBC now.  Dr Zenaida at bedside.   Length of Stay: 0  Greig Mosses, NP  12/25/2023, 12:41 PM    Advanced Heart Failure Team Pager [phone] (M-F; 7a - 5p)  Please contact [facility] for night-coverage after hours (4p -7a ) and weekends on [email or web address]

## 2023-12-25 NOTE — Transfer of Care (Signed)
 Immediate Anesthesia Transfer of Care Note  Patient: [name]  Procedure(s) Performed: TRANSCATHETER MITRAL EDGE TO EDGE REPAIR TRANSESOPHAGEAL ECHOCARDIOGRAM  Patient Location: Cath Lab  Anesthesia Type:General  Level of Consciousness: awake, alert , and oriented  Airway & Oxygen Therapy: Patient Spontanous Breathing  Post-op Assessment: Report given to RN and Post -op Vital signs reviewed and stable  Post vital signs: Reviewed and stable  Last Vitals:  Vitals Value Taken Time  BP 83/55 12/25/23 11:20  Temp 98   Pulse 100 12/25/23 11:21  Resp 15 12/25/23 11:21  SpO2 97 % 12/25/23 11:17  Vitals shown include unfiled device data.  Last Pain:  Vitals:   12/25/23 1022  TempSrc:   PainSc: Asleep         Complications: There were no known notable events for this encounter.

## 2023-12-25 NOTE — Op Note (Signed)
    PROCEDURE:  Transcatheter edge to edge mitral valve repair (TEER) INDICATION: Severe symptomatic mitral regurgitation (Stage D)  SURGEON:  Lurena Red, MD  CO-SURGEON: Ozell Fell, MD   PROCEDURAL DETAILS: General anesthesia is induced.  The patient is prepped and draped.  Baseline transesophageal echo images are obtained and confirm appropriate anatomy for transcatheter edge-to-edge mitral valve repair.  Using vascular ultrasound guidance, the right common femoral vein is accessed via a front wall puncture.  2 Perclose sutures are deployed and an 8 [place] sheath is inserted.  A versa cross wire is advanced into the SVC.  Heparin  is administered and a therapeutic ACT is achieved.  Transseptal puncture is performed over the mid posterior portion of the fossa.  The septum is dilated while the steerable guide catheter is prepped.  After progressively dilating the right common femoral vein, the 22 [place] guide catheter is inserted and advanced across the interatrial septum.  The dilator and the versa cross wire were carefully removed and the guide tip is appropriately positioned approximately 2 cm across the interatrial septum.  A PASCAL ACE device is prepped per protocol.  The implant system consisting of the PASCAL device, steerable catheter, and implant catheters is inserted through the guide catheter.  The PASCAL ACE device is positioned above the mitral valve after applying appropriate manuevers to the guide and steerable catheters.  The PASCAL ACE device is then placed in the capture ready position and oriented coaxially with the anterior and posterior leaflets of the mitral valve.  Low tidal volume ventilation is initiated.  The clip device is advanced across the mitral valve and pulled back until capture of both the anterior and posterior leaflets is achieved.  Clasps are dropped and the device is closed under TEE guidance.  Careful TEE assessment is performed and reduction in mitral valve  regurgitation is felt to be appropriate.  Leaflet insertion is verified using 3D imaging.  The PASCAL ACE device is deployed using normal technique.  After full TEE assessment, there is significant mitral regurgitation lateral to the first device.  The decision is made to place a second Pascal ACE device.  A second device is prepped per protocol.  The device is advanced through the steerable guide catheter and maneuvers are again made to direct the device just lateral to the first device in the left atrium above the valve.  Orientation is confirmed using 3D TEE imaging.  The device is advanced in parallel fashion to the first device, just lateral in position in the closed position.  The device was then opened and pulled back up against the mitral leaflets.  The clasps are dropped in good tissue capture is noted for both the anterior and posterior leaflets.  The device is closed with excellent reduction in mitral regurgitation down to trace.  The device is released using normal maneuvers.  PROCEDURE COMPLETION: The guide catheter is pulled back into the right atrium and the interatrial septum is assessed with TEE.  There is no significant septal injury seen and no right to left shunting.  The guide catheter is removed and the Perclose sutures are tightened.  Protamine  is administered.  CONCLUSION: Successful transcatheter edge-to-edge mitral valve repair under fluoroscopic and echo guidance, reducing baseline 4+ mitral regurgitation to trace, devices positioned A3/P3 (second device just lateral to first over A2/P2)  Ozell Fell 12/25/2023 3:53 PM

## 2023-12-25 NOTE — Consult Note (Signed)
 Advanced Heart Failure Team Consult Note   Primary Physician: Aisha Harvey, MD Cardiologist:  Arun K Thukkani, MD  Reason for Consultation: Mitral Clip/Pericardial Effusion   HPI:    [name] is seen today for evaluation of M at the request of Dr Wendel.   [name] is a 83 year old with history of mitral valve disease, carotid disease, breast cancer treated with resection, and CAD with DES to RCA 11/22/2023. She had been on DAPT.   High risk stress test. Referred to Dr Wendel. She was set up for cath and TEE.  TEE - EF 55-60% Severe MR noted.  Cath 11/22/2023- Post cath placed on lasix  20 mg twice a day+ DAPT ASA & Plavix .  RCA 3 DES  Mild nonobstructive CAD LAD/left circumflex. Fick cardiac output of 5.76 L/min and Fick cardiac index of 3.22 L/min/m with the following hemodynamics:            Right atrial pressure mean of 8 mmHg with C V waves to 12 mmHg            RV 41/5 with end-diastolic pressure 12 mmHg            Wedge pressure mean of 18 mmHg with V waves to 28 mmHg            PA pressure 40/16 with a mean of 27 mmHg            PVR of 1.6 Woods units            PA pulsatility index of 3  Admitted with scheduled mitral clip. Post procedure she developed hypotension, acute shortness of breath,  and chest pain. Given albumin . AMS reported in recovery room. Stat Echo with large pericardial effusion. S/P Pericardiocentesis with 350 cc removed. On arrival to ICU SBP 120s Map 70s. Arterial line in place with good waveform.  She was turned and developed hypotension with Map 40-50s. Alert and oriented.    Home Medications Prior to Admission medications   Medication Sig Start Date End Date Taking? Authorizing Provider  ALPRAZolam  (XANAX ) 0.25 MG tablet Take 0.25 mg by mouth daily as needed for anxiety (prior to air travel or dental appointments). 09/04/16  Yes [provider]  amLODipine  (NORVASC ) 2.5 MG tablet Take 1 tablet (2.5 mg total) by mouth daily. 12/06/23   Yes Fountain, Madison L, NP  aspirin  EC 81 MG tablet Take 1 tablet (81 mg total) by mouth daily. Swallow whole. 11/22/23  Yes Thukkani, Arun K, MD  atorvastatin  (LIPITOR) 10 MG tablet TAKE 1 TABLET (10 MG TOTAL) BY MOUTH 3 (THREE) TIMES A WEEK. TAKES M-W-F. 11/01/23  Yes Swinyer, Rosaline CHRISTELLA, NP  azelastine  (ASTELIN ) 0.1 % nasal spray Place 1 spray into both nostrils 2 (two) times daily as needed for rhinitis or allergies. 10/24/23  Yes [provider]  Bempedoic Acid  (NEXLETOL ) 180 MG TABS Take 1 tablet (180 mg total) by mouth daily. 11/11/23  Yes Swinyer, Rosaline CHRISTELLA, NP  cholecalciferol  (VITAMIN D3) 25 MCG (1000 UNIT) tablet Take 1,000 Units by mouth daily.   Yes [provider]  clopidogrel  (PLAVIX ) 75 MG tablet Take 1 tablet (75 mg total) by mouth daily. 12/06/23  Yes Fountain, Madison L, NP  escitalopram  (LEXAPRO ) 5 MG tablet Take 5 mg by mouth daily.   Yes [provider]  fluticasone (FLONASE ALLERGY RELIEF) 50 MCG/ACT nasal spray Place 1 spray into both nostrils daily as needed for allergies or rhinitis. 04/25/18  Yes  [provider]  furosemide  (LASIX ) 20 MG tablet Take 1 tablet (20 mg total) by mouth 2 (two) times daily. 12/06/23 12/05/24 Yes Fountain, Lum CROME, NP  linaclotide (LINZESS) 72 MCG capsule Take 72 mcg by mouth daily before breakfast. Per patient skips taking it when stomach is upset. Patient taking differently: Take 72 mcg by mouth as needed. Per patient skips taking it when stomach is upset. 01/26/19  Yes [provider]  meclizine  (ANTIVERT ) 25 MG tablet Take 25 mg by mouth 3 (three) times daily as needed for dizziness.  12/24/12  Yes [provider]  Melatonin 3 MG TBDP Take 1.5 mg by mouth at bedtime.   Yes [provider]  Multiple Vitamin (MULTIVITAMIN WITH MINERALS) TABS tablet Take 1 tablet by mouth every other day.   Yes [provider]  Omega-3 Krill Oil 500 MG CAPS Take 500 mg by mouth daily.   Yes  [provider]  Polyvinyl Alcohol -Povidone (REFRESH OP) Place 1 drop into both eyes every 6 (six) hours as needed (dry eyes).   Yes [provider]  denosumab  (PROLIA ) 60 MG/ML SOSY injection Inject 60 mg into the skin every 6 (six) months.    [provider]  famotidine  (PEPCID  AC) 10 MG tablet Take 10 mg by mouth daily as needed for heartburn or indigestion.    [provider]  meloxicam  (MOBIC ) 15 MG tablet Take 15 mg by mouth daily as needed for pain.  12/22/12   [provider]  methocarbamol  (ROBAXIN ) 500 MG tablet Take 500 mg by mouth every 8 (eight) hours as needed for muscle spasms.    [provider]  nitroGLYCERIN  (NITROSTAT ) 0.4 MG SL tablet Place 1 tablet (0.4 mg total) under the tongue every 5 (five) minutes as needed for chest pain. 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 Wendel    Past Surgical History: Past Surgical History:  Procedure Laterality Date   ABDOMINAL HYSTERECTOMY     partial   APPENDECTOMY     BREAST EXCISIONAL BIOPSY Bilateral    benign   BREAST SURGERY Bilateral    bx    CORONARY STENT INTERVENTION N/A 11/22/2023   Procedure: CORONARY STENT INTERVENTION;  Surgeon: Thukkani, Arun K, MD;  Location: [facility] INVASIVE CV LAB;  Service: Cardiovascular;  Laterality: N/A;   EYE SURGERY Bilateral    cataracts   LUNG REMOVAL, PARTIAL Right 01/26/2013   for Stage IA non small cell   RIGHT/LEFT HEART CATH AND CORONARY ANGIOGRAPHY N/A 11/22/2023   Procedure: RIGHT/LEFT HEART CATH AND CORONARY ANGIOGRAPHY;  Surgeon:  Wendel Lurena POUR, MD;  Location: [facility] INVASIVE CV LAB;  Service: Cardiovascular;  Laterality: N/A;   TONSILLECTOMY     TRANSESOPHAGEAL ECHOCARDIOGRAM (CATH LAB) N/A 11/22/2023   Procedure: TRANSESOPHAGEAL ECHOCARDIOGRAM;  Surgeon: Delford Maude BROCKS, MD;  Location[facility] INVAS[facility] CV LAB;  Service: Cardiovascular;  Laterality: N/A;   VIDEO ASSISTED THORACOSCOPY (VATS)/WEDGE RESECTION Right 01/26/2013   Procedure: VIDEO ASSISTED THORACOSCOPY (VATS) with wedge resection right middle lobe  nodule , segmental resection rigth lower lobe lung, lymph node sampling,;  Surgeon: Elspeth JAYSON Millers, MD;  Location: [facility] OR;  Service: Thoracic;  Laterality: Right;    Family History: Family History  Problem Relation Age of Onset   Lung cancer Father    Diabetes Mellitus II Father    Hypertension Father    Hyperlipidemia Father    Cancer - Other Brother     Social History: Social History   Socioeconomic History   Marital status: Divorced    Spouse name: Not on file   Number of children: Not on file   Years of education: Not on file   Highest education level: Not on file  Occupational History   Not on file  Tobacco Use   Smoking status: Former    Current packs/day: 0.00    Average packs/day: 1 pack/day for 40.0 years (40.0 ttl pk-yrs)    Types: Cigarettes    Start date: 04/16/1965    Quit date: 04/16/2005    Years since quitting: 18.7   Smokeless tobacco: Never  Substance and Sexual Activity   Alcohol  use: No   Drug use: No   Sexual activity: Not on file  Other Topics Concern   Not on file  Social History Narrative   Not on file   Social Drivers of Health   Financial Resource Strain: Not on file  Food Insecurity: Not on file  Transportation Needs: Not on file  Physical Activity: Not on file  Stress: Not on file  Social Connections: Not on file    Allergies:  Allergies  Allergen Reactions   Doxycycline Other (See Comments)    Upset stomach   Erythromycin Other (See Comments)    Upset  stomach    Objective:    Vital Signs:   Temp:  [97.8 F (36.6 C)] 97.8 F (36.6 C) (09/10 0711) Pulse Rate:  [54-134] 93 (09/10 1209) Resp:  [10-26] 18 (09/10 1209) BP: (49-172)/(31-84) 120/84 (09/10 1209) SpO2:  [93 %-100 %] 97 % (09/10 1209) Weight:  [68 kg] 68 kg (09/10 0711)    Weight change: Filed Weights   12/25/23 0711  Weight: 68 kg    Intake/Output:   Intake/Output Summary (Last 24 hours) at 12/25/2023 1241 Last data filed at 12/25/2023 1030 Gross per 24 hour  Intake 700 ml  Output --  Net 700 ml      Physical Exam   General:  Pale. Appears weak.  Neck: no JVD.  Cor: Regular rate & rhythm. Pericardial Drain . Lungs: clear Abdomen: soft, nontender, nondistended.  Extremities:  Groin site ok. No hematoma. no  edema. Cool extremities.  Neuro: alert & oriented x3   Telemetry   SR  90s with occasional PVCs.   EKG      Labs   Basic Metabolic Panel: Recent Labs  Lab 12/20/23 1343  NA 140  K 4.5  CL 100  CO2 26  GLUCOSE 83  BUN 29*  CREATININE 1.06*  CALCIUM  9.3    Liver Function Tests: Recent Labs  Lab 12/20/23 1343  AST 30  ALT 17  ALKPHOS 59  BILITOT 0.3  PROT 7.0  ALBUMIN  4.4   No results for input(s): LIPASE, AMYLASE in the last 168 hours. No results for input(s): AMMONIA in the last 168 hours.  CBC: Recent Labs  Lab 12/20/23 1343  WBC 4.7  NEUTROABS 3.0  HGB 11.5  HCT 36.6  MCV 95  PLT 173    Cardiac Enzymes: No results for input(s):  CKTOTAL, CKMB, CKMBINDEX, TROPONINI in the last 168 hours.  BNP: BNP (last 3 results) No results for input(s): BNP in the last 8760 hours.  ProBNP (last 3 results) Recent Labs    12/20/23 1343  PROBNP 940*     CBG: No results for input(s): GLUCAP in the last 168 hours.  Coagulation Studies: No results for input(s): LABPROT, INR in the last 72 hours.   Imaging   ECHO TEE Result Date: 12/25/2023    TRANSESOPHOGEAL ECHO REPORT   Patient Name:    DONNIS PECHA Date of Exam: 12/25/2023 Medical Rec #:  [id number]      Height:       66.5 in Accession #:    [id number]     Weight:       150.0 lb Date of Birth:  30-Jan-1941      BSA:          1.780 m Patient Age:    83 years       BP:           144/57 mmHg Patient Gender: F              HR:           67 bpm. Exam Location:  Inpatient Procedure: Transesophageal Echo, 3D Echo, Color Doppler and Cardiac Doppler            (Both Spectral and Color Flow Doppler were utilized during            procedure). Indications:     Mitral Regurgitation i34.0  History:         Patient has prior history of Echocardiogram examinations, most                  recent 11/22/2023. CAD; Risk Factors:Hypertension.  Sonographer:     Damien Senior RDCS Referring Phys:  [id number] ARUN K THUKKANI Diagnosing Phys: Maude Emmer MD PROCEDURE: After discussion of the risks and benefits of a TEE, an informed consent was obtained from the patient. The transesophogeal probe was passed without difficulty through the esophogus of the patient. Sedation performed by different physician. The patient developed no complications during the procedure. Patient under general anesthesia for clip implantation.  IMPRESSIONS  1. Left ventricular ejection fraction, by estimation, is 60 to 65%. The left ventricle has normal function.  2. Right ventricular systolic function is normal. The right ventricular size is normal.  3. Left atrial size was severely dilated. No left atrial/left atrial appendage thrombus was detected.  4. Right atrial size was moderately dilated.  5. Extensive 3D imaging of MV done to guide mTEER. Pirior to intervention P3 flail with severe MR. Total reversal of upper PVls , ERO 0.88 cm2 and RV 135 cc Post mTEER Two successfully placed ACE devices Good 3D bridge. Trivial MR seen between the two clips. Mean gradient 7 peak 11 mmHg at HR of 66 bpm. . 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  6. Prolapse of septal and posterior  leaflets by 3D imaging. The tricuspid valve is abnormal. 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  7. The aortic valve is tricuspid. There is mild calcification of the aortic valve. Aortic valve regurgitation is mild. Aortic valve sclerosis is present, with no evidence of aortic valve stenosis.  8. PFO present prior to trans septal puncture. Post trans septal puncture only left to right shunting.  9. 3D performed of the mitral valve and 3D performed of the tricuspid valve and demonstrates  Used to guide mTEER procedure and image TV. 10. Patient under general anesthesia for clip implantation. FINDINGS  Left Ventricle: Left ventricular ejection fraction, by estimation, is 60 to 65%. The left ventricle has normal function. The left ventricular internal cavity size was normal in size. Right Ventricle: The right ventricular size is normal. Right vetricular wall thickness was not assessed. Right ventricular systolic function is normal. Left Atrium: Left atrial size was severely dilated. No left atrial/left atrial appendage thrombus was detected. Right Atrium: Right atrial size was moderately dilated. Pericardium: There is no evidence of pericardial effusion. Mitral Valve: Extensive 3D imaging of MV done to guide mTEER. Pirior to intervention P3 flail with severe MR. Total reversal of upper PVls , ERO 0.88 cm2 and RV 135 cc Post mTEER Two successfully placed ACE devices Good 3D bridge. Trivial MR seen between  the two clips. Mean gradient 7 peak 11 mmHg at HR of 66 bpm. 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 Tricuspid Valve: Prolapse of septal and posterior leaflets by 3D imaging. The tricuspid valve is abnormal. 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 Aortic Valve: The aortic valve is tricuspid. There is mild calcification of the aortic valve. Aortic valve regurgitation is mild. Aortic valve sclerosis is present, with no evidence of aortic valve stenosis. Pulmonic Valve:  The pulmonic valve was normal in structure. Pulmonic valve regurgitation is mild. Aorta: The aortic root is normal in size and structure. IAS/Shunts: PFO present prior to trans septal puncture. Post trans septal puncture only left to right shunting. Additional Comments: 3D was performed not requiring image post processing on an independent workstation and was abnormal. Spectral Doppler performed. Maude Emmer MD Electronically signed by Maude Emmer MD Signature Date/Time: 12/25/2023/10:49:23 AM    Final    EP STUDY Result Date: 12/25/2023 See surgical note for result.  Structural Heart Procedure Result Date: 12/25/2023 See surgical note for result.  DG Chest 2 View Result Date: 12/25/2023 CLINICAL DATA:  Preop for mitral valve repair.  Dyspnea on exertion. EXAM: CHEST - 2 VIEW COMPARISON:  February 08, 2023. FINDINGS: The heart size and mediastinal contours are within normal limits. Minimal right basilar scarring is noted. No definite acute pulmonary abnormality is noted. Old right rib fractures are again noted. IMPRESSION: No active cardiopulmonary disease. Electronically Signed   By: Lynwood Landy Raddle M.D.   On: 12/25/2023 08:30     Medications:     Current Medications:  [START ON 12/26/2023] amLODipine   2.5 mg Oral Daily   [START ON 12/26/2023] aspirin  EC  81 mg Oral Daily   [START ON 12/27/2023] atorvastatin   10 mg Oral Once per day on Monday Wednesday Friday   [START ON 12/26/2023] cholecalciferol   1,000 Units Oral Daily   [START ON 12/26/2023] clopidogrel   75 mg Oral Daily   [START ON 12/26/2023] escitalopram   5 mg Oral Daily   furosemide   20 mg Oral BID   melatonin  1.5 mg Oral QHS   sodium chloride  flush  3 mL Intravenous Q12H    Infusions:  sodium chloride      albumin  human        Patient Profile   Admitted to ICU post Mitral Clip complicated by Pericardial Effusion and shock.   Assessment/Plan   1. Shock, ?Hypovolemia  ? Pericardial Effusion  Pericardial Effusion post  mitral clip. Urgent Pericardiocentesis with 350 cc of bloody output. Adding colchicine  0.6 mg twice a day.  POCUS ? Under filled. Give 1 Liter NS now. She then  developed hypotension. Added Norepi.  Central line placed. Check CVP/CO-OX  Check stat cbc. Lactic acid 1.1.  Obtain stat ECHO   2. Mitral Valve Diseease, S/P MTEER , Clip x2.  See above.   3. CAD 11/22/23 DES RCA. Last dose of aspirin /plavix  earlier this morning.   Check BMET/CBC now.  Dr Zenaida at bedside.   Length of Stay: 0  Greig Mosses, NP  12/25/2023, 12:41 PM    Advanced Heart Failure Team Pager [phone] (M-F; 7a - 5p)  Please contact [facility] for night-coverage after hours (4p -7a ) and weekends on [email or web address]

## 2023-12-25 NOTE — Anesthesia Procedure Notes (Signed)
 Arterial Line Insertion Start/End9/01/2024 8:00 AM, 12/25/2023 8:05 AM Performed by: Zelphia Norleen HERO, CRNA, CRNA  Patient location: Pre-op. Preanesthetic checklist: patient identified, IV checked, site marked, risks and benefits discussed, surgical consent, monitors and equipment checked, pre-op evaluation, timeout performed and anesthesia consent Lidocaine  1% used for infiltration Left, radial was placed Catheter size: 20 G Hand hygiene performed  and maximum sterile barriers used   Attempts: 1 Procedure performed without using ultrasound guided technique. Following insertion, dressing applied and Biopatch. Post procedure assessment: normal and unchanged  Patient tolerated the procedure well with no immediate complications.

## 2023-12-25 NOTE — Discharge Summary (Incomplete)
 [facility]   MULTIDISCIPLINARY HEART VALVE TEAM  Discharge Summary    Patient ID: [name] MRN: [id number]; DOB: [birth year]  Admit date: 12/25/2023 Discharge date: 12/25/2023  PCP:  Aisha Harvey, MD  [facility] Cardiologist:  Lurena MARLA Red, MD  [facility] Structural heart: Lurena MARLA Red, MD [facility] Electrophysiologist:  None   Discharge Diagnoses    Principal Problem:   S/P mitral valve clip implantation Active Problems:   Hyperlipidemia   S/P partial lobectomy of lung   Non-small cell cancer of right lung (HCC)   CAD (coronary artery disease)   Hypertension   Severe mitral regurgitation   Allergies Allergies  Allergen Reactions   Doxycycline Other (See Comments)    Upset stomach   Erythromycin Other (See Comments)    Upset stomach    Diagnostic Studies/Procedures     [facility]   MULTIDISCIPLINARY HEART TEAM   Date of Procedure:                12/25/2023   Preoperative Diagnosis:Severe Symptomatic Mitral Regurgitation (Stage D)   Postoperative Diagnosis:    Same    Procedure Performed: Ultrasound-guided right transfemoral venous access Double PreClose right femoral vein Transseptal puncture using Bailess RF needle Mitral edge to edge repair with PASCAL ACE x 2   Surgeon: Lurena Red, MD Co-Surgeon: Ozell JONETTA Fell, MD Echocardiographer: Delford   Anesthesiologist: Leopoldo   Device Implant: PASCAL ACE placed at A3/P3   Procedural Indication: Severe Non-rheumatic Mitral Regurgitation (Stage D)    Brief History: The patient 83 year old female with a history of lung cancer status post partial lung resection, hypertension, coronary artery disease status post PCI of the right coronary artery in August 2025, hyperlipidemia, carotid disease, emphysema, depression, and severe symptomatic degenerative mitral digitation who is referred for elective mitral transcatheter edge-to-edge repair with the [employer] system.   NYHA class:2   Echo Findings: Preop:  Normal LV systolic function Severe MR secondary to flail P3, Grade 4+ Post-op:  Unchanged LV systolic function Trace residual MR    _____________    Echo 12/26/23: completed but pending formal read at the time of discharge   History of Present Illness     [name] is a 83 y.o. female with a history of coronary artery disease s/p DES x 3 to the ostial to distal RCA (11/22/23), emphysema, lung cancer s/p partial lung resection, polymyalgia rheumatica, HLD, carotid artery disease, HFpEF and severe mitral valve regurgitation who presented to [facility] on 12/25/23 for planned mTEER.   Pt reported ongoing dyspnea. Echocardiogram 09/04/2023 showed LVEF 60 to 65%, myxomatous mitral valve with moderate to severe mitral valve regurgitation. Cardiac PET/CT 11/06/23 revealed large defect with moderate reduction in uptake in the mid to basal inferior and inferior septal locations that is reversible consistent with ischemia. TEE 11/22/2023 showed LVEF 55 to 60%, no RWMA, RV function and size normal, left atrial size severely dilated, severe anteriorly directed MR, flail segment of the P3 scallop, severe mitral valve regurgitation, moderate to severe tricuspid valve regurgitation with prolapse of the septal and anterior leaflets, mild aortic valve regurgitation. [facility] 11/22/23 showed severe single-vessel CAD with 70% stenosis of ostial to proximal RCA, 90% stenosis of proximal RCA, and 80% stenosis of RCA with otherwise only minimal disease.  She underwent successful PCI with overlapping DES x 3 to the ostial to distal RCA. LVEDP was elevated at 27 mmHg.  She was started on DAPT with aspirin  81  mg daily and Plavix  75 mg daily with plans to continue this for 6 months followed by Plavix  monotherapy indefinitely.  She was also started on Lasix  20 mg daily.  The patient was evaluated by the multidisciplinary valve team and felt to have severe, symptomatic mitral  regurgitation and to be a suitable candidate for mTEER, which was set up for 12/25/23.  Hospital Course     Consultants: none   Severe AS:  -- S/p mTEER with with PASCAL ACE x 2  placed at A3/P3 by Dr Wendel on 12/25/23.  -- Post operative echo completed but pending formal read. -- Groin site stable.  -- Continue Asprin 81mg  daily and Plavix  75mg  daily.   -- Met with cardiac rehab to discuss CRP phase II.  -- Plan for discharge home today with close follow up in the outpatient setting.   Pericardial effusion: --   CAD: -- S/p DES x 3 to the ostial to distal RCA (11/22/23).  -- Continue Asprin 81mg  daily and Plavix  75mg  daily.  -- Continue atorvastatin  10mg  daily.   HLD: -- Atorvastatin  10mg  daily.  -- Bempedoic acid    _____________  Discharge Vitals Blood pressure (!) 83/55, pulse 97, temperature 97.8 F (36.6 C), temperature source Oral, resp. rate 18, height 5' 6.5 (1.689 m), weight 68 kg, SpO2 97%.  Filed Weights   12/25/23 0711  Weight: 68 kg     GEN: Well nourished, well developed in no acute distress NECK: No JVD CARDIAC: ***RRR, no murmurs, rubs, gallops RESPIRATORY:  Clear to auscultation without rales, wheezing or rhonchi  ABDOMEN: Soft, non-tender, non-distended EXTREMITIES:  No edema; No deformity.  Groin sites clear without hematoma or ecchymosis. ****   Disposition   Pt is being discharged home today in good condition.  Follow-up Plans & Appointments     Follow-up Information     Sebastian Lamarr SAUNDERS, PA-C. Go on 01/02/2024.   Specialties: Cardiology, Radiology Why: @ 3:35 pm, please arrive at least 20 minutes early. Contact information: [address] [place] [address] [phone]                  Discharge Medications   Allergies as of 12/25/2023       Reactions   Doxycycline Other (See Comments)   Upset stomach   Erythromycin Other (See Comments)   Upset stomach     Med Rec must be completed prior to using this  [facility]***           Outstanding Labs/Studies   ***  ______________________  Duration of Discharge Encounter: APP Time: *** minutes    Signed, Lamarr Sebastian, PA-C 12/25/2023, 11:55 AM [phone]

## 2023-12-25 NOTE — Op Note (Signed)
 [facility]   MULTIDISCIPLINARY HEART TEAM  Date of Procedure:  12/25/2023  Preoperative Diagnosis: Severe Symptomatic Mitral Regurgitation (Stage D)  Postoperative Diagnosis: Same   Procedure Performed: Ultrasound-guided right transfemoral venous access Double PreClose right femoral vein Transseptal puncture using Bailess RF needle Mitral edge to edge repair with PASCAL ACE x 2  Surgeon: Lurena Red, MD Co-Surgeon: Ozell JONETTA Fell, MD Echocardiographer: Delford  Anesthesiologist: Leopoldo  Device Implant: PASCAL ACE placed at A3/P3  Procedural Indication: Severe Non-rheumatic Mitral Regurgitation (Stage D)   Brief History: The patient 83 year old female with a history of lung cancer status post partial lung resection, hypertension, coronary artery disease status post PCI of the right coronary artery in August 2025, hyperlipidemia, carotid disease, emphysema, depression, and severe symptomatic degenerative mitral digitation who is referred for elective mitral transcatheter edge-to-edge repair with the [employer] system.  NYHA class:2  Echo Findings: Preop:  Normal LV systolic function Severe MR secondary to flail P3, Grade 4+ Post-op:  Unchanged LV systolic function Trace residual MR  Procedural Details: Prep The patient is brought to the cardiac catheterization lab in the fasting state. General anesthesia is induced. The patient is prepped from the groin to chin. A foley catheter is placed. Hemodynamics are monitored via a radial artery line.   Venous Access Using ultrasound guidance, the right femoral vein is punctured. Ultrasound images are captured and stored in the patient's chart. The vein is dilated and 2 Perclose devices are deplyed at 10' and 2' positions to 'Preclose' the femoral vein. An 8 Fr sheath is inserted.  Transseptal Puncture A Baylis Versacross wire is advanced into the SVC A Baylis transseptal dilator is advanced into the SVC, and  the VersaCross RF wire is retracted into the dilator  The transseptal sheath is retracted into the RA under fluoroscopic and echo guidance to obtain position on the posterior fossa where echo measurements are made to assure appropriate access to the mitral valve. Once proper position is confirmed by echo, RF energy is delivered and the VersaCross wire is advanced into the LA without resistance. The dilator and sheath are advanced over the wire where proper position is confirmed by echo and pressure measurement Weight based IV heparin  is administered and a therapeutic ACT > 250 is confirmed  Guide Catheter Insertion The VersaCross wire is positioned at the left upper pulmonary vein The femoral vein is progressively dilated and the 22 Fr guide catheter is inserted and then directed across the interatrial septum over the wire. Position is confirmed approximately 2 cm into the left atrium.  The dilator and wire is then removed and the guide catheter is locked into place on the stabilizer rail platform system.  PASCAL ACE Insertion The PASCAL ACE is prepped per protocol and inserted via the introducer into the guide catheter The implant system (consisting of the steerable and implant catheters) is through the loader, the loader is removed, and 45cc of blood is aspirated and the system is flushed with an equal volume of saline. The implant system is advanced under fluoro and echo guidance so that it exits the guide catheter into the left atrium.  The device is then closed and the clasps are retracted.  PASCAL ACE Positioning in the Left Atrium (Supravalvular Alignment) Using flexion and torquing of the steerable catheter as well as advancement and retraction of the guide catheter, the device is centered above the target of therapy and with an optimized trajectory 2D and 3D TEE imaging is performed in multiple  planes and the device is positioned and aligned above the valve using standard steering techniques    Entry into the Left Ventricle and Mitral Valve Leaflet Grasp The device is opened to the capture-ready configuration The clasps are then independently lowered to test functionality The device is advanced across the mitral valve into the LV, maintaining proper orientation The Clip arms are opened to 180 degrees and the device is slowly retracted  Capture of both the anterior and posterior leaflets are visualized by echo and the clasps are dropped  PASCAL ACE Deployment After extensive echo evaluation, reduction in mitral regurgitation is felt to be adequate The suture locks are then unthreaded and removed from the lock base The implant lock is then unthreaded and removed from the lock base  Placement of PASCAL ACE (second device) PASCAL Insertion The PASCAL ACE is prepped per protocol and inserted via the introducer into the steerable guide catheter The implant system is advanced under fluoro and echo guidance so that it exits the guide catheter into the left atrium.  The device is then closed and the clasps are retracted.  PASCAL ACE Positioning in the Left Atrium (Supravalvular Alignment) Using flexion and torquing of the steerable catheter as well as advancement and retraction of the guide catheter, the device is centered above the target of therapy and with an optimized trajectory 2D and 3D TEE imaging is performed in multiple planes and the device is positioned and aligned above the valve using standard steering techniques   Entry into the Left Ventricle and Mitral Valve Leaflet Grasp The PASCAL ACE is advanced across the mitral valve into the LV, maintaining proper orientation and with caution taken to avoid contact with the first PASCAL device Capture of both the anterior and posterior leaflets are visualized by echo and the clasps are dropped  PASCAL ACE Deployment After extensive echo evaluation, reduction in mitral regurgitation is felt to be adequate The suture locks are then  unthreaded and removed from the lock base The implant lock is then unthreaded and removed from the lock base  Device Removal The implant system is removed under echo guidance The guide catheter is retracted into the right atrium and the interatrial septum is assessed by echo without evidence of right-to-left shunting or large ASD  Hemostasis The guide catheter is removed over a 0.035 wire and the Perclose sutures are tightened with complete hemostasis and no evidence of hematoma  Estimated blood loss: minimal  There are no immediate procedural complications. The patient is transferred to the post-procedure recovery area in stable condition.   [name] 12/25/2023 10:47 AM

## 2023-12-25 NOTE — Interval H&P Note (Signed)
 History and Physical Interval Note:  12/25/2023 7:05 AM  [name]  has presented today for surgery, with the diagnosis of severe mitral insufficiency.  The various methods of treatment have been discussed with the patient and family. After consideration of risks, benefits and other options for treatment, the patient has consented to  Procedure(s): TRANSCATHETER MITRAL EDGE TO EDGE REPAIR (N/A) TRANSESOPHAGEAL ECHOCARDIOGRAM (N/A) as a surgical intervention.  The patient's history has been reviewed, patient examined, no change in status, stable for surgery.  I have reviewed the patient's chart and labs.  Questions were answered to the patient's satisfaction.     Aul Mangieri K Bali Lyn

## 2023-12-25 NOTE — Discharge Instructions (Signed)

## 2023-12-25 NOTE — Progress Notes (Signed)
 Approximately 1055 pt began having runs of decreased Blood pressure along with complaints of chest pain when attempting to take a deep breath in. Patient placed in trendelenburg position, normal saline fluid bolus began. Dr. Leopoldo was notified. Moser gave this nurse verbal order for Albumin  5% 250mg  bolus to be administered and was started at approximately 1105, Anesthesiologist Violet arrived in pt's room, dealt with hypootension. pt's BP continued to decrease into the 40's systolic, PT's mental status changed as she was not able to verbalize why she was here or what procedure she had done earlier. Pt reported feeling nauseous and color of skin changed to pale. Anesthesiologist continued to treat hypotension. this nurse performed another EKG per Dr. Parry order. STAT ECHO ordered by Dr. Cherri and was performed in room. Dr. Wendel and team took pt back in to cath lab.

## 2023-12-25 NOTE — Consult Note (Signed)
 NAME:  [name], MRN:  [id number], DOB:  March 21, 1941, LOS: 0 ADMISSION DATE:  12/25/2023, CONSULTATION DATE:  9/10s REFERRING MD:  zenaida, CHIEF COMPLAINT:  critical care services    History of Present Illness:  83 year old female w/ sig h/o HTN, CAD w/ prior DESx 3 w/ LVED of 27, severe MVR and mod to severe TR. NYHA glass II symptoms w/ fatigue, dizziness on occasion. She has been on DAPT. Last TEE EF 55-50% svr MR. Seen by cardiac surg in consultation on 9/5 and felt to be poor candidate for surgical repair. Presented to [facility] on 9/10 for svr MR where she underwent planned mitral clip.  Post procedure developed acute hypotension, SOB and CP. Treated w/ albumin , stat echo showed large pericardial effusion. Had emergent pericardicentesis where was removed and drain placed. Sbp Subsequently improved, but then hypotensive again.. Transferred to ICU post procedure. Got 1 liter NS for POCUS suggesting volume depletion. Started on norepinephrine , right internal jugular CVL was placed for co-ox and CVP monitoring. PCCM asked to assist w/ RX Pertinent  Medical History  CAD s/p PCI w/ DESx3 ostial-distal RCA on 8/8. Her LEVDP was 27 MVR (severe) w/ flail segment and mod to severe tricuspid regurg.  Baseline NYHA class II HLD Bilateral Carotid artery stenosis HTN Lung cancer  Significant Hospital Events: Including procedures, antibiotic start and stop dates in addition to other pertinent events   9/9 admitted for mitral clipping. Developed acute onset of SOB, CP and hypotension post procedure. Had lg pericardial effusion. 350 ml drawn off and drain placed. Initially responded but subsequently developed hypotension. Started on NE. CVL placed. PCCM consulted to assist w care. On arrival reports severe chest and substernal chest pain. No sig output from drain since placement   Interim History / Subjective:  Severe CP  Objective    Blood pressure 120/84, pulse 93, temperature 97.8 F (36.6  C), temperature source Oral, resp. rate 18, height 5' 6.5 (1.689 m), weight 68 kg, SpO2 97%.        Intake/Output Summary (Last 24 hours) at 12/25/2023 1311 Last data filed at 12/25/2023 1030 Gross per 24 hour  Intake 700 ml  Output --  Net 700 ml   Filed Weights   12/25/23 0711  Weight: 68 kg    Examination: General: awake reports CP  HENT NCAT no JVD MMM Lungs: dec bases. Mild increase in bilateral airspace disease  Cardiovascular: Regular rate and rhythm.  Abdomen: soft not tender  Extremities: warm no sig edema Neuro: oriented  GU: due to void   Resolved problem list   Assessment and Plan  Undifferentiated shock. Was initially seeming obstructive in nature and improved after pericardial drain placement and then prob complicated by mixed picture of hypovolemia and +/- cardiogenic. Got 1 liter of NS. Pressor requirements improving but looks like some edema on CXR Plan Check CVP Ck co-ox Cont NE infusion for MAP 65 Ck lactic acid.  Repeat CBC F/u repeat echo  Pericardial effusion Plan Cont drainage Monitor output closely  F/u echo Cards has added colchicine   S/p Mitral valve clipping x 2 Plan Echo Additional recs per cards  Chest pain.  Suspect post-procedural. Cards team aware Plan Gave additional fent F/u echo   H/o CAD w/ DES Plan Still on asa and plavix . Will need to watch drainage closely   H/o lung cancer remote Plan F/u out pt  Labs   CBC: Recent Labs  Lab 12/20/23 1343  WBC 4.7  NEUTROABS  3.0  HGB 11.5  HCT 36.6  MCV 95  PLT 173    Basic Metabolic Panel: Recent Labs  Lab 12/20/23 1343  NA 140  K 4.5  CL 100  CO2 26  GLUCOSE 83  BUN 29*  CREATININE 1.06*  CALCIUM  9.3   GFR: Estimated Creatinine Clearance: 39.1 mL/min (A) (by C-G formula based on SCr of 1.06 mg/dL (H)). Recent Labs  Lab 12/20/23 1343 12/25/23 1242  WBC 4.7  --   LATICACIDVEN  --  1.2    Liver Function Tests: Recent Labs  Lab 12/20/23 1343   AST 30  ALT 17  ALKPHOS 59  BILITOT 0.3  PROT 7.0  ALBUMIN  4.4   No results for input(s): LIPASE, AMYLASE in the last 168 hours. No results for input(s): AMMONIA in the last 168 hours.  ABG    Component Value Date/Time   PHART 7.357 11/22/2023 1229   PCO2ART 41.2 11/22/2023 1229   PO2ART 69 (L) 11/22/2023 1229   HCO3 25.4 11/22/2023 1232   TCO2 27 11/22/2023 1232   ACIDBASEDEF 1.0 11/22/2023 1232   O2SAT 60 11/22/2023 1232     Coagulation Profile: Recent Labs  Lab 12/20/23 1343  INR 1.0    Cardiac Enzymes: No results for input(s): CKTOTAL, CKMB, CKMBINDEX, TROPONINI in the last 168 hours.  HbA1C: No results found for: HGBA1C  CBG: No results for input(s): GLUCAP in the last 168 hours.  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   Surgical History:   Past Surgical History:  Procedure Laterality Date   ABDOMINAL HYSTERECTOMY     partial   APPENDECTOMY     BREAST EXCISIONAL BIOPSY Bilateral    benign   BREAST SURGERY Bilateral    bx    CORONARY STENT INTERVENTION N/A 11/22/2023   Procedure: CORONARY STENT INTERVENTION;  Surgeon: Wendel Lurena POUR, MD;  Location: [facility] INVASIVE CV LAB;  Service: Cardiovascular;  Laterality: N/A;   EYE SURGERY Bilateral    cataracts   LUNG REMOVAL, PARTIAL Right 01/26/2013   for Stage IA non small cell   RIGHT/LEFT HEART CATH AND CORONARY ANGIOGRAPHY N/A 11/22/2023   Procedure: RIGHT/LEFT HEART CATH AND CORONARY ANGIOGRAPHY;  Surgeon: Wendel Lurena POUR, MD;  Location: [facility] INVASIVE CV LAB;  Service: Cardiovascular;  Laterality: N/A;   TONSILLECTOMY     TRANSESOPHAGEAL ECHOCARDIOGRAM (CATH LAB) N/A 11/22/2023    Procedure: TRANSESOPHAGEAL ECHOCARDIOGRAM;  Surgeon: Delford Maude BROCKS, MD;  Location: [facility] INVASIVE CV LAB;  Service: Cardiovascular;  Laterality: N/A;   VIDEO ASSISTED THORACOSCOPY (VATS)/WEDGE RESECTION Right 01/26/2013   Procedure: VIDEO ASSISTED THORACOSCOPY (VATS) with wedge resection right middle lobe nodule , segmental resection rigth lower lobe lung, lymph node sampling,;  Surgeon: Elspeth BROCKS Millers, MD;  Location: [facility] OR;  Service: Thoracic;  Laterality: Right;     Social History:   reports that she quit smoking about 18 years ago. Her smoking use included cigarettes. She started smoking about 58 years ago. She has a 40 pack-year smoking history. She has never used smokeless tobacco. She reports that she does not drink alcohol  and does not use drugs.   Family History:  Her family history includes Cancer - Other in her brother; Diabetes Mellitus II in her father;  Hyperlipidemia in her father; Hypertension in her father; Lung cancer in her father.   Allergies Allergies  Allergen Reactions   Doxycycline Other (See Comments)    Upset stomach   Erythromycin Other (See Comments)    Upset stomach     Home Medications  Prior to Admission medications   Medication Sig Start Date End Date Taking? Authorizing Provider  ALPRAZolam  (XANAX ) 0.25 MG tablet Take 0.25 mg by mouth daily as needed for anxiety (prior to air travel or dental appointments). 09/04/16  Yes [provider]  amLODipine  (NORVASC ) 2.5 MG tablet Take 1 tablet (2.5 mg total) by mouth daily. 12/06/23  Yes Fountain, Madison L, NP  aspirin  EC 81 MG tablet Take 1 tablet (81 mg total) by mouth daily. Swallow whole. 11/22/23  Yes Thukkani, Arun K, MD  atorvastatin  (LIPITOR) 10 MG tablet TAKE 1 TABLET (10 MG TOTAL) BY MOUTH 3 (THREE) TIMES A WEEK. TAKES M-W-F. 11/01/23  Yes Swinyer, Rosaline HERO, NP  azelastine  (ASTELIN ) 0.1 % nasal spray Place 1 spray into both nostrils 2 (two) times daily as needed for rhinitis or allergies.  10/24/23  Yes [provider]  Bempedoic Acid  (NEXLETOL ) 180 MG TABS Take 1 tablet (180 mg total) by mouth daily. 11/11/23  Yes Swinyer, Rosaline HERO, NP  cholecalciferol  (VITAMIN D3) 25 MCG (1000 UNIT) tablet Take 1,000 Units by mouth daily.   Yes [provider]  clopidogrel  (PLAVIX ) 75 MG tablet Take 1 tablet (75 mg total) by mouth daily. 12/06/23  Yes Fountain, Madison L, NP  escitalopram  (LEXAPRO ) 5 MG tablet Take 5 mg by mouth daily.   Yes [provider]  fluticasone (FLONASE ALLERGY RELIEF) 50 MCG/ACT nasal spray Place 1 spray into both nostrils daily as needed for allergies or rhinitis. 04/25/18  Yes [provider]  furosemide  (LASIX ) 20 MG tablet Take 1 tablet (20 mg total) by mouth 2 (two) times daily. 12/06/23 12/05/24 Yes Fountain, Lum CROME, NP  linaclotide (LINZESS) 72 MCG capsule Take 72 mcg by mouth daily before breakfast. Per patient skips taking it when stomach is upset. Patient taking differently: Take 72 mcg by mouth as needed. Per patient skips taking it when stomach is upset. 01/26/19  Yes [provider]  meclizine  (ANTIVERT ) 25 MG tablet Take 25 mg by mouth 3 (three) times daily as needed for dizziness.  12/24/12  Yes [provider]  Melatonin 3 MG TBDP Take 1.5 mg by mouth at bedtime.   Yes [provider]  Multiple Vitamin (MULTIVITAMIN WITH MINERALS) TABS tablet Take 1 tablet by mouth every other day.   Yes [provider]  Omega-3 Krill Oil 500 MG CAPS Take 500 mg by mouth daily.   Yes [provider]  Polyvinyl Alcohol -Povidone (REFRESH OP) Place 1 drop into both eyes every 6 (six) hours as needed (dry eyes).   Yes [provider]  denosumab  (PROLIA ) 60 MG/ML SOSY injection Inject 60 mg into the skin every 6 (six) months.    [provider]  famotidine  (PEPCID  AC) 10 MG tablet Take 10 mg by mouth daily as needed for heartburn or indigestion.    [provider]  meloxicam   (MOBIC ) 15 MG tablet Take 15 mg by mouth daily as needed for pain.  12/22/12   [provider]  methocarbamol  (ROBAXIN ) 500 MG tablet Take 500 mg by mouth every 8 (eight) hours as needed for muscle spasms.    [provider]  nitroGLYCERIN  (NITROSTAT ) 0.4 MG SL tablet Place 1 tablet (  0.4 mg total) under the tongue every 5 (five) minutes as needed for chest pain. 11/22/23 11/21/24  Goodrich, Callie E, PA-C     Critical care time: 16 min

## 2023-12-26 ENCOUNTER — Encounter (HOSPITAL_COMMUNITY): Admission: RE | Disposition: E | Payer: Self-pay | Source: Home / Self Care | Attending: Cardiology

## 2023-12-26 ENCOUNTER — Inpatient Hospital Stay (HOSPITAL_COMMUNITY)

## 2023-12-26 ENCOUNTER — Encounter (HOSPITAL_COMMUNITY): Payer: Self-pay | Admitting: Internal Medicine

## 2023-12-26 DIAGNOSIS — Z952 Presence of prosthetic heart valve: Secondary | ICD-10-CM

## 2023-12-26 DIAGNOSIS — I071 Rheumatic tricuspid insufficiency: Secondary | ICD-10-CM

## 2023-12-26 DIAGNOSIS — I340 Nonrheumatic mitral (valve) insufficiency: Secondary | ICD-10-CM

## 2023-12-26 DIAGNOSIS — Z9889 Other specified postprocedural states: Secondary | ICD-10-CM | POA: Diagnosis not present

## 2023-12-26 DIAGNOSIS — Z902 Acquired absence of lung [part of]: Secondary | ICD-10-CM

## 2023-12-26 DIAGNOSIS — E875 Hyperkalemia: Secondary | ICD-10-CM | POA: Diagnosis not present

## 2023-12-26 DIAGNOSIS — R570 Cardiogenic shock: Secondary | ICD-10-CM | POA: Diagnosis not present

## 2023-12-26 DIAGNOSIS — I314 Cardiac tamponade: Secondary | ICD-10-CM

## 2023-12-26 DIAGNOSIS — I309 Acute pericarditis, unspecified: Secondary | ICD-10-CM

## 2023-12-26 DIAGNOSIS — R579 Shock, unspecified: Secondary | ICD-10-CM

## 2023-12-26 DIAGNOSIS — I3139 Other pericardial effusion (noninflammatory): Secondary | ICD-10-CM

## 2023-12-26 DIAGNOSIS — I9581 Postprocedural hypotension: Secondary | ICD-10-CM

## 2023-12-26 DIAGNOSIS — Z95818 Presence of other cardiac implants and grafts: Secondary | ICD-10-CM | POA: Diagnosis not present

## 2023-12-26 DIAGNOSIS — N179 Acute kidney failure, unspecified: Secondary | ICD-10-CM

## 2023-12-26 HISTORY — PX: RIGHT/LEFT HEART CATH AND CORONARY ANGIOGRAPHY: CATH118266

## 2023-12-26 LAB — POCT I-STAT 7, (LYTES, BLD GAS, ICA,H+H)
Acid-Base Excess: 7 mmol/L — ABNORMAL HIGH (ref 0.0–2.0)
Bicarbonate: 33.3 mmol/L — ABNORMAL HIGH (ref 20.0–28.0)
Calcium, Ion: 0.87 mmol/L — CL (ref 1.15–1.40)
HCT: 27 % — ABNORMAL LOW (ref 36.0–46.0)
Hemoglobin: 9.2 g/dL — ABNORMAL LOW (ref 12.0–15.0)
O2 Saturation: 100 %
Potassium: 4 mmol/L (ref 3.5–5.1)
Sodium: 145 mmol/L (ref 135–145)
TCO2: 35 mmol/L — ABNORMAL HIGH (ref 22–32)
pCO2 arterial: 56.1 mmHg — ABNORMAL HIGH (ref 32–48)
pH, Arterial: 7.382 (ref 7.35–7.45)
pO2, Arterial: 300 mmHg — ABNORMAL HIGH (ref 83–108)

## 2023-12-26 LAB — COOXEMETRY PANEL
Carboxyhemoglobin: 1.3 % (ref 0.5–1.5)
Methemoglobin: <0.7 % (ref 0.0–1.5)
O2 Saturation: 43.7 %
Total hemoglobin: 11.4 g/dL — ABNORMAL LOW (ref 12.0–16.0)

## 2023-12-26 LAB — POCT I-STAT EG7
Acid-base deficit: 8 mmol/L — ABNORMAL HIGH (ref 0.0–2.0)
Acid-base deficit: 8 mmol/L — ABNORMAL HIGH (ref 0.0–2.0)
Bicarbonate: 20.3 mmol/L (ref 20.0–28.0)
Bicarbonate: 20.5 mmol/L (ref 20.0–28.0)
Calcium, Ion: 1 mmol/L — ABNORMAL LOW (ref 1.15–1.40)
Calcium, Ion: 1.01 mmol/L — ABNORMAL LOW (ref 1.15–1.40)
HCT: 30 % — ABNORMAL LOW (ref 36.0–46.0)
HCT: 31 % — ABNORMAL LOW (ref 36.0–46.0)
Hemoglobin: 10.2 g/dL — ABNORMAL LOW (ref 12.0–15.0)
Hemoglobin: 10.5 g/dL — ABNORMAL LOW (ref 12.0–15.0)
O2 Saturation: 44 %
O2 Saturation: 50 %
Potassium: 4.9 mmol/L (ref 3.5–5.1)
Potassium: 4.9 mmol/L (ref 3.5–5.1)
Sodium: 138 mmol/L (ref 135–145)
Sodium: 138 mmol/L (ref 135–145)
TCO2: 22 mmol/L (ref 22–32)
TCO2: 22 mmol/L (ref 22–32)
pCO2, Ven: 56 mmHg (ref 44–60)
pCO2, Ven: 56.6 mmHg (ref 44–60)
pH, Ven: 7.166 — CL (ref 7.25–7.43)
pH, Ven: 7.167 — CL (ref 7.25–7.43)
pO2, Ven: 31 mmHg — CL (ref 32–45)
pO2, Ven: 34 mmHg (ref 32–45)

## 2023-12-26 LAB — CG4 I-STAT (LACTIC ACID)
Lactic Acid, Venous: 5.7 mmol/L (ref 0.5–1.9)
Lactic Acid, Venous: 6.6 mmol/L (ref 0.5–1.9)
Lactic Acid, Venous: 5.3 mmol/L (ref 0.5–1.9)
Lactic Acid, Venous: 5.6 mmol/L (ref 0.5–1.9)

## 2023-12-26 LAB — CBC
HCT: 34.3 % — ABNORMAL LOW (ref 36.0–46.0)
HCT: 35.5 % — ABNORMAL LOW (ref 36.0–46.0)
HCT: 35.3 % — ABNORMAL LOW (ref 36.0–46.0)
HCT: 32.9 % — ABNORMAL LOW (ref 36.0–46.0)
Hemoglobin: 10.8 g/dL — ABNORMAL LOW (ref 12.0–15.0)
Hemoglobin: 10.9 g/dL — ABNORMAL LOW (ref 12.0–15.0)
Hemoglobin: 11 g/dL — ABNORMAL LOW (ref 12.0–15.0)
Hemoglobin: 10.4 g/dL — ABNORMAL LOW (ref 12.0–15.0)
MCH: 30.6 pg (ref 26.0–34.0)
MCH: 30.3 pg (ref 26.0–34.0)
MCH: 31.1 pg (ref 26.0–34.0)
MCH: 31.2 pg (ref 26.0–34.0)
MCHC: 31.5 g/dL (ref 30.0–36.0)
MCHC: 30.7 g/dL (ref 30.0–36.0)
MCHC: 31.2 g/dL (ref 30.0–36.0)
MCHC: 31.6 g/dL (ref 30.0–36.0)
MCV: 97.2 fL (ref 80.0–100.0)
MCV: 98.6 fL (ref 80.0–100.0)
MCV: 99.7 fL (ref 80.0–100.0)
MCV: 98.8 fL (ref 80.0–100.0)
Platelets: 299 K/uL (ref 150–400)
Platelets: 300 K/uL (ref 150–400)
Platelets: 275 K/uL (ref 150–400)
Platelets: 241 K/uL (ref 150–400)
RBC: 3.53 MIL/uL — ABNORMAL LOW (ref 3.87–5.11)
RBC: 3.6 MIL/uL — ABNORMAL LOW (ref 3.87–5.11)
RBC: 3.54 MIL/uL — ABNORMAL LOW (ref 3.87–5.11)
RBC: 3.33 MIL/uL — ABNORMAL LOW (ref 3.87–5.11)
RDW: 13.4 % (ref 11.5–15.5)
RDW: 13.4 % (ref 11.5–15.5)
RDW: 13.6 % (ref 11.5–15.5)
RDW: 13.5 % (ref 11.5–15.5)
WBC: 14.6 K/uL — ABNORMAL HIGH (ref 4.0–10.5)
WBC: 13.3 K/uL — ABNORMAL HIGH (ref 4.0–10.5)
WBC: 13.7 K/uL — ABNORMAL HIGH (ref 4.0–10.5)
WBC: 15.3 K/uL — ABNORMAL HIGH (ref 4.0–10.5)
nRBC: 0 % (ref 0.0–0.2)
nRBC: 0 % (ref 0.0–0.2)
nRBC: 0 % (ref 0.0–0.2)
nRBC: 0 % (ref 0.0–0.2)

## 2023-12-26 LAB — BASIC METABOLIC PANEL WITH GFR
Anion gap: 15 (ref 5–15)
Anion gap: 18 — ABNORMAL HIGH (ref 5–15)
Anion gap: 14 (ref 5–15)
BUN: 37 mg/dL — ABNORMAL HIGH (ref 8–23)
BUN: 39 mg/dL — ABNORMAL HIGH (ref 8–23)
BUN: 44 mg/dL — ABNORMAL HIGH (ref 8–23)
CO2: 16 mmol/L — ABNORMAL LOW (ref 22–32)
CO2: 15 mmol/L — ABNORMAL LOW (ref 22–32)
CO2: 19 mmol/L — ABNORMAL LOW (ref 22–32)
Calcium: 7.2 mg/dL — ABNORMAL LOW (ref 8.9–10.3)
Calcium: 7 mg/dL — ABNORMAL LOW (ref 8.9–10.3)
Calcium: 7.1 mg/dL — ABNORMAL LOW (ref 8.9–10.3)
Chloride: 105 mmol/L (ref 98–111)
Chloride: 103 mmol/L (ref 98–111)
Chloride: 103 mmol/L (ref 98–111)
Creatinine, Ser: 1.62 mg/dL — ABNORMAL HIGH (ref 0.44–1.00)
Creatinine, Ser: 2.14 mg/dL — ABNORMAL HIGH (ref 0.44–1.00)
Creatinine, Ser: 2.51 mg/dL — ABNORMAL HIGH (ref 0.44–1.00)
GFR, Estimated: 32 mL/min — ABNORMAL LOW (ref >60)
GFR, Estimated: 23 mL/min — ABNORMAL LOW (ref >60)
GFR, Estimated: 19 mL/min — ABNORMAL LOW (ref >60)
Glucose, Bld: 266 mg/dL — ABNORMAL HIGH (ref 70–99)
Glucose, Bld: 274 mg/dL — ABNORMAL HIGH (ref 70–99)
Glucose, Bld: 247 mg/dL — ABNORMAL HIGH (ref 70–99)
Potassium: 5.2 mmol/L — ABNORMAL HIGH (ref 3.5–5.1)
Potassium: 5.8 mmol/L — ABNORMAL HIGH (ref 3.5–5.1)
Potassium: 4.5 mmol/L (ref 3.5–5.1)
Sodium: 136 mmol/L (ref 135–145)
Sodium: 136 mmol/L (ref 135–145)
Sodium: 136 mmol/L (ref 135–145)

## 2023-12-26 LAB — ECHOCARDIOGRAM COMPLETE
AR max vel: 2.16 cm2
AV Area VTI: 2.17 cm2
AV Area mean vel: 1.87 cm2
AV Mean grad: 2 mmHg
AV Peak grad: 3.4 mmHg
Ao pk vel: 0.92 m/s
Area-P 1/2: 1.12 cm2
Height: 66.5 in
MV VTI: 0.48 cm2
S' Lateral: 2.79 cm
Weight: 2546.75 [oz_av]

## 2023-12-26 LAB — GLUCOSE, CAPILLARY: Glucose-Capillary: 250 mg/dL — ABNORMAL HIGH (ref 70–99)

## 2023-12-26 SURGERY — RIGHT/LEFT HEART CATH AND CORONARY ANGIOGRAPHY
Anesthesia: LOCAL

## 2023-12-26 MED ORDER — MIDAZOLAM HCL 2 MG/2ML IJ SOLN
1.0000 mg | Freq: Once | INTRAMUSCULAR | Status: AC
  Administered 2023-12-26: 1 mg via INTRAVENOUS
  Filled 2023-12-26: qty 2

## 2023-12-26 MED ORDER — PHENYLEPHRINE 80 MCG/ML (10ML) SYRINGE FOR IV PUSH (FOR BLOOD PRESSURE SUPPORT)
PREFILLED_SYRINGE | INTRAVENOUS | Status: AC
  Administered 2023-12-26: 160 ug via INTRAVENOUS
  Filled 2023-12-26: qty 10

## 2023-12-26 MED ORDER — PROCHLORPERAZINE EDISYLATE 10 MG/2ML IJ SOLN
10.0000 mg | INTRAMUSCULAR | Status: DC | PRN
  Administered 2023-12-26 – 2023-12-28 (×3): 10 mg via INTRAVENOUS
  Filled 2023-12-26 (×5): qty 2

## 2023-12-26 MED ORDER — VERAPAMIL HCL 2.5 MG/ML IV SOLN
INTRAVENOUS | Status: AC
  Filled 2023-12-26: qty 2

## 2023-12-26 MED ORDER — HEPARIN SODIUM (PORCINE) 1000 UNIT/ML IJ SOLN
INTRAMUSCULAR | Status: AC
  Filled 2023-12-26: qty 10

## 2023-12-26 MED ORDER — FAMOTIDINE 20 MG PO TABS
20.0000 mg | ORAL_TABLET | Freq: Two times a day (BID) | ORAL | Status: DC
  Administered 2023-12-26 (×2): 20 mg
  Filled 2023-12-26 (×2): qty 1

## 2023-12-26 MED ORDER — INSULIN ASPART 100 UNIT/ML IV SOLN
10.0000 [IU] | Freq: Once | INTRAVENOUS | Status: AC
  Administered 2023-12-26: 10 [IU] via INTRAVENOUS

## 2023-12-26 MED ORDER — PANTOPRAZOLE SODIUM 40 MG IV SOLR
40.0000 mg | Freq: Every day | INTRAVENOUS | Status: DC
  Administered 2023-12-26 – 2023-12-27 (×2): 40 mg via INTRAVENOUS
  Filled 2023-12-26 (×2): qty 10

## 2023-12-26 MED ORDER — MIDAZOLAM BOLUS VIA INFUSION: 0.0000 mg | INTRAVENOUS | Status: DC | PRN

## 2023-12-26 MED ORDER — FREE WATER: 250.0000 mL | Freq: Once | Status: DC

## 2023-12-26 MED ORDER — KETAMINE HCL 50 MG/5ML IJ SOSY
75.0000 mg | PREFILLED_SYRINGE | Freq: Once | INTRAMUSCULAR | Status: AC
  Administered 2023-12-26: 75 mg via INTRAVENOUS
  Filled 2023-12-26: qty 10

## 2023-12-26 MED ORDER — PHENYLEPHRINE 80 MCG/ML (10ML) SYRINGE FOR IV PUSH (FOR BLOOD PRESSURE SUPPORT)
160.0000 ug | PREFILLED_SYRINGE | Freq: Once | INTRAVENOUS | Status: AC
Start: 2023-12-26 — End: 2023-12-26
  Administered 2023-12-26: 160 ug via INTRAVENOUS

## 2023-12-26 MED ORDER — CALCIUM GLUCONATE-NACL 1-0.675 GM/50ML-% IV SOLN
1.0000 g | Freq: Once | INTRAVENOUS | Status: AC
  Administered 2023-12-26: 1000 mg via INTRAVENOUS
  Filled 2023-12-26: qty 50

## 2023-12-26 MED ORDER — MIDAZOLAM HCL 2 MG/2ML IJ SOLN
INTRAMUSCULAR | Status: DC | PRN
  Administered 2023-12-26: 1 mg via INTRAVENOUS

## 2023-12-26 MED ORDER — DEXMEDETOMIDINE HCL IN NACL 400 MCG/100ML IV SOLN
INTRAVENOUS | Status: AC
  Administered 2023-12-26: 0.4 ug/kg/h via INTRAVENOUS
  Filled 2023-12-26: qty 100

## 2023-12-26 MED ORDER — FENTANYL BOLUS VIA INFUSION
25.0000 ug | INTRAVENOUS | Status: DC | PRN
  Administered 2023-12-26 – 2023-12-27 (×2): 50 ug via INTRAVENOUS

## 2023-12-26 MED ORDER — CALCIUM GLUCONATE-NACL 2-0.675 GM/100ML-% IV SOLN
2.0000 g | Freq: Once | INTRAVENOUS | Status: AC
  Administered 2023-12-26: 2000 mg via INTRAVENOUS
  Filled 2023-12-26: qty 100

## 2023-12-26 MED ORDER — FENTANYL CITRATE PF 50 MCG/ML IJ SOSY: 25.0000 ug | PREFILLED_SYRINGE | Freq: Once | INTRAMUSCULAR | Status: DC

## 2023-12-26 MED ORDER — DEXMEDETOMIDINE HCL IN NACL 400 MCG/100ML IV SOLN
0.0000 ug/kg/h | INTRAVENOUS | Status: DC
  Administered 2023-12-27: 0.3 ug/kg/h via INTRAVENOUS
  Filled 2023-12-26: qty 100

## 2023-12-26 MED ORDER — INSULIN ASPART 100 UNIT/ML IJ SOLN
3.0000 [IU] | INTRAMUSCULAR | Status: DC
  Administered 2023-12-26 – 2023-12-27 (×3): 9 [IU] via SUBCUTANEOUS

## 2023-12-26 MED ORDER — INSULIN ASPART 100 UNIT/ML IJ SOLN: 2.0000 [IU] | INTRAMUSCULAR | Status: DC

## 2023-12-26 MED ORDER — ALBUTEROL SULFATE (2.5 MG/3ML) 0.083% IN NEBU
10.0000 mg | INHALATION_SOLUTION | Freq: Once | RESPIRATORY_TRACT | Status: AC
  Administered 2023-12-26: 10 mg via RESPIRATORY_TRACT
  Filled 2023-12-26: qty 12

## 2023-12-26 MED ORDER — DOBUTAMINE-DEXTROSE 4-5 MG/ML-% IV SOLN
2.5000 ug/kg/min | INTRAVENOUS | Status: DC
  Administered 2023-12-26 – 2023-12-28 (×2): 5 ug/kg/min via INTRAVENOUS
  Administered 2023-12-30: 7.5 ug/kg/min via INTRAVENOUS
  Filled 2023-12-26 (×2): qty 250

## 2023-12-26 MED ORDER — SODIUM CHLORIDE 0.9 % IV SOLN
3.0000 g | Freq: Two times a day (BID) | INTRAVENOUS | Status: DC
  Administered 2023-12-26 – 2023-12-28 (×4): 3 g via INTRAVENOUS
  Filled 2023-12-26 (×4): qty 8

## 2023-12-26 MED ORDER — ROCURONIUM BROMIDE 10 MG/ML (PF) SYRINGE
100.0000 mg | PREFILLED_SYRINGE | Freq: Once | INTRAVENOUS | Status: AC
  Administered 2023-12-26: 100 mg via INTRAVENOUS

## 2023-12-26 MED ORDER — SODIUM BICARBONATE 8.4 % IV SOLN
100.0000 meq | Freq: Once | INTRAVENOUS | Status: AC
  Administered 2023-12-26: 100 meq via INTRAVENOUS
  Filled 2023-12-26: qty 100

## 2023-12-26 MED ORDER — ORAL CARE MOUTH RINSE
15.0000 mL | OROMUCOSAL | Status: DC
  Administered 2023-12-26 – 2023-12-27 (×9): 15 mL via OROMUCOSAL

## 2023-12-26 MED ORDER — ARFORMOTEROL TARTRATE 15 MCG/2ML IN NEBU
15.0000 ug | INHALATION_SOLUTION | Freq: Two times a day (BID) | RESPIRATORY_TRACT | Status: DC
  Administered 2023-12-26 – 2023-12-30 (×8): 15 ug via RESPIRATORY_TRACT
  Filled 2023-12-26 (×8): qty 2

## 2023-12-26 MED ORDER — EPINEPHRINE HCL 5 MG/250ML IV SOLN IN NS
INTRAVENOUS | Status: AC
  Filled 2023-12-26: qty 250

## 2023-12-26 MED ORDER — LIDOCAINE HCL (PF) 1 % IJ SOLN
INTRAMUSCULAR | Status: AC
  Filled 2023-12-26: qty 30

## 2023-12-26 MED ORDER — DOCUSATE SODIUM 50 MG/5ML PO LIQD
100.0000 mg | Freq: Two times a day (BID) | ORAL | Status: DC
Start: 2023-12-26 — End: 2023-12-27
  Administered 2023-12-26 – 2023-12-27 (×3): 100 mg
  Filled 2023-12-26 (×3): qty 10

## 2023-12-26 MED ORDER — VASOPRESSIN 20 UNITS/100 ML INFUSION FOR SHOCK
0.0000 [IU]/min | INTRAVENOUS | Status: DC
  Administered 2023-12-26 – 2023-12-30 (×7): 0.03 [IU]/min via INTRAVENOUS
  Administered 2023-12-30: 0.04 [IU]/min via INTRAVENOUS
  Filled 2023-12-26 (×9): qty 100

## 2023-12-26 MED ORDER — INSULIN ASPART 100 UNIT/ML IJ SOLN: 3.0000 [IU] | INTRAMUSCULAR | Status: DC

## 2023-12-26 MED ORDER — HEPARIN (PORCINE) IN NACL 1000-0.9 UT/500ML-% IV SOLN
INTRAVENOUS | Status: DC | PRN
  Administered 2023-12-26 (×3): 500 mL

## 2023-12-26 MED ORDER — FENTANYL 2500MCG IN NS 250ML (10MCG/ML) PREMIX INFUSION: 0.0000 ug/h | INTRAVENOUS | Status: DC

## 2023-12-26 MED ORDER — SODIUM ZIRCONIUM CYCLOSILICATE 10 G PO PACK: 10.0000 g | PACK | Freq: Once | ORAL | Status: DC

## 2023-12-26 MED ORDER — PHENYLEPHRINE 80 MCG/ML (10ML) SYRINGE FOR IV PUSH (FOR BLOOD PRESSURE SUPPORT): 160.0000 ug | PREFILLED_SYRINGE | Freq: Once | INTRAVENOUS | Status: AC

## 2023-12-26 MED ORDER — MIDAZOLAM-SODIUM CHLORIDE 100-0.9 MG/100ML-% IV SOLN: 0.0000 mg/h | INTRAVENOUS | Status: DC

## 2023-12-26 MED ORDER — PHENYLEPHRINE 80 MCG/ML (10ML) SYRINGE FOR IV PUSH (FOR BLOOD PRESSURE SUPPORT): 320.0000 ug | PREFILLED_SYRINGE | Freq: Once | INTRAVENOUS | Status: DC | PRN

## 2023-12-26 MED ORDER — EPINEPHRINE 1 MG/10ML IJ SOSY
PREFILLED_SYRINGE | INTRAMUSCULAR | Status: AC
  Filled 2023-12-26: qty 10

## 2023-12-26 MED ORDER — IOHEXOL 350 MG/ML SOLN
INTRAVENOUS | Status: DC | PRN
  Administered 2023-12-26: 20 mL

## 2023-12-26 MED ORDER — COLCHICINE 0.6 MG PO TABS
0.6000 mg | ORAL_TABLET | Freq: Every day | ORAL | Status: DC
  Administered 2023-12-27: 0.6 mg via ORAL
  Filled 2023-12-26 (×2): qty 1

## 2023-12-26 MED ORDER — HEPARIN SODIUM (PORCINE) 1000 UNIT/ML IJ SOLN
INTRAMUSCULAR | Status: DC | PRN
  Administered 2023-12-26: 4000 [IU] via INTRAVENOUS

## 2023-12-26 MED ORDER — LIDOCAINE HCL (PF) 1 % IJ SOLN
INTRAMUSCULAR | Status: DC | PRN
  Administered 2023-12-26: 2 mL via INTRADERMAL

## 2023-12-26 MED ORDER — MIDAZOLAM HCL 2 MG/2ML IJ SOLN
INTRAMUSCULAR | Status: AC
  Filled 2023-12-26: qty 2

## 2023-12-26 MED ORDER — VERAPAMIL HCL 2.5 MG/ML IV SOLN
INTRAVENOUS | Status: DC | PRN
  Administered 2023-12-26: 10 mL via INTRA_ARTERIAL

## 2023-12-26 MED ORDER — REVEFENACIN 175 MCG/3ML IN SOLN
175.0000 ug | Freq: Every day | RESPIRATORY_TRACT | Status: DC
  Administered 2023-12-26 – 2023-12-30 (×5): 175 ug via RESPIRATORY_TRACT
  Filled 2023-12-26 (×5): qty 3

## 2023-12-26 MED ORDER — DEXTROSE 50 % IV SOLN
25.0000 g | Freq: Once | INTRAVENOUS | Status: AC
  Administered 2023-12-26: 25 g via INTRAVENOUS
  Filled 2023-12-26: qty 50

## 2023-12-26 MED ORDER — FENTANYL 2500MCG IN NS 250ML (10MCG/ML) PREMIX INFUSION
INTRAVENOUS | Status: AC
  Administered 2023-12-26: 50 ug/h via INTRAVENOUS
  Filled 2023-12-26: qty 250

## 2023-12-26 MED ORDER — POLYETHYLENE GLYCOL 3350 17 G PO PACK
17.0000 g | PACK | Freq: Every day | ORAL | Status: DC
  Administered 2023-12-26 – 2023-12-27 (×2): 17 g
  Filled 2023-12-26 (×2): qty 1

## 2023-12-26 MED ORDER — ORAL CARE MOUTH RINSE: 15.0000 mL | OROMUCOSAL | Status: DC | PRN

## 2023-12-26 SURGICAL SUPPLY — 14 items
CATH 5FR JL3.5 JR4 ANG PIG MP (CATHETERS) IMPLANT
CATH SWAN GANZ 7F STRAIGHT (CATHETERS) IMPLANT
COVER PRB 48X5XTLSCP FOLD TPE (BAG) IMPLANT
DEVICE RAD COMP TR BAND LRG (VASCULAR PRODUCTS) IMPLANT
ELECT DEFIB PAD ADLT CADENCE (PAD) IMPLANT
GLIDESHEATH SLEND SS 6F .021 (SHEATH) IMPLANT
GLIDESHEATH SLENDER 7FR .021G (SHEATH) IMPLANT
GUIDEWIRE .025 260CM (WIRE) IMPLANT
GUIDEWIRE INQWIRE 1.5J.035X260 (WIRE) IMPLANT
GUIDEWIRE TIGER .035X300 (WIRE) IMPLANT
PACK CARDIAC CATHETERIZATION (CUSTOM PROCEDURE TRAY) ×2 IMPLANT
SET ATX-X65L (MISCELLANEOUS) IMPLANT
TRANSDUCER W/STOPCOCK (MISCELLANEOUS) IMPLANT
TUBING ART PRESS 72 MALE/FEM (TUBING) IMPLANT

## 2023-12-26 NOTE — Progress Notes (Signed)
 NAME:  [name], MRN:  [id number], DOB:  Aug 17, 1940, LOS: 1 ADMISSION DATE:  12/25/2023, CONSULTATION DATE:  9/10s REFERRING MD:  zenaida, CHIEF COMPLAINT:  critical care services    History of Present Illness:  83 year old female w/ sig h/o HTN, CAD w/ prior DESx 3 w/ LVED of 27, severe MVR and mod to severe TR. NYHA glass II symptoms w/ fatigue, dizziness on occasion. She has been on DAPT. Last TEE EF 55-50% svr MR. Seen by cardiac surg in consultation on 9/5 and felt to be poor candidate for surgical repair. Presented to [facility] on 9/10 for svr MR where she underwent planned mitral clip.  Post procedure developed acute hypotension, SOB and CP. Treated w/ albumin , stat echo showed large pericardial effusion. Had emergent pericardicentesis where was removed and drain placed. Sbp Subsequently improved, but then hypotensive again.. Transferred to ICU post procedure. Got 1 liter NS for POCUS suggesting volume depletion. Started on norepinephrine , right internal jugular CVL was placed for co-ox and CVP monitoring. PCCM asked to assist w/ RX Pertinent  Medical History  CAD s/p PCI w/ DESx3 ostial-distal RCA on 8/8. Her LEVDP was 27 MVR (severe) w/ flail segment and mod to severe tricuspid regurg.  Baseline NYHA class II HLD Bilateral Carotid artery stenosis HTN Lung cancer  Significant Hospital Events: Including procedures, antibiotic start and stop dates in addition to other pertinent events   9/9 admitted for mitral clipping. Developed acute onset of SOB, CP and hypotension post procedure. Had lg pericardial effusion. 350 ml drawn off and drain placed. Initially responded but subsequently developed hypotension. Started on NE. CVL placed. PCCM consulted to assist w care. On arrival reports severe chest and substernal chest pain. No sig output from drain since placement   Interim History / Subjective:  Still c/o some CP, nausea  On low dose levo, weaning   Objective    Blood pressure  (!) 96/55, pulse 69, temperature 97.7 F (36.5 C), temperature source Axillary, resp. rate 20, height 5' 6.5 (1.689 m), weight 72.2 kg, SpO2 93%. CVP:  [7 mmHg-19 mmHg] 11 mmHg      Intake/Output Summary (Last 24 hours) at 12/26/2023 0848 Last data filed at 12/26/2023 0800 Gross per 24 hour  Intake 3989.48 ml  Output 330 ml  Net 3659.48 ml   Filed Weights   12/25/23 0711 12/26/23 0300  Weight: 68 kg 72.2 kg    Examination: General: awake reports CP  HENT NCAT no JVD MMM Lungs: dec bases. Mild increase in bilateral airspace disease  Cardiovascular: Regular rate and rhythm.  Abdomen: soft not tender  Extremities: warm no sig edema Neuro: oriented  GU: due to void   Resolved problem list   Assessment and Plan  Undifferentiated shock.  Initially thought obstructive in nature and improved after pericardial drain placement and then prob complicated by mixed picture of hypovolemia and +/- cardiogenic. Pressor requirements improved, now continuing to require 6-8mcg levo. Lactate normal.  Plan trend CVP Echo pending  Heart failure following  Cont NE infusion for MAP 65 Trend hgb  Pericardial effusion - s/p pericardiocentesis 9/10 ( )  Plan Cont drainage Monitor output closely  F/u echo  Severe MR S/p Mitral valve clipping x 2 9/10 Plan Echo Cards managing  Asa, plavix    Chest pain Pericarditis  Plan Pain mgmt  Colchicine , ASA  F/u echo pending  H/o CAD w/ recent DES (11/22/23) Plan Still on asa and plavix . Will need to watch drainage closely  Statin  Cards managing   AKI  Hyperkalemia P May need diuresis - CVP 14. Weight up but increasing pressor needs so will defer to HF and cards.  Rx K  F/u chem  D/w cards at bedside, awaiting formal echo read   H/o lung cancer remote Plan F/u out pt   Close ICU monitoring - ongoing d/w cards, heart failure at bedside  Labs   CBC: Recent Labs  Lab 12/20/23 1343 12/25/23 1359 12/25/23 1921 12/26/23 0313  12/26/23 0700 12/26/23 0752  WBC 4.7 9.5 14.2* 14.6* 13.3* 13.7*  NEUTROABS 3.0  --   --   --   --   --   HGB 11.5 10.0* 10.7* 10.8* 10.9* 11.0*  HCT 36.6 31.3* 33.6* 34.3* 35.5* 35.3*  MCV 95 96.9 96.8 97.2 98.6 99.7  PLT 173 207 299 299 300 275    Basic Metabolic Panel: Recent Labs  Lab 12/20/23 1343 12/25/23 1359 12/26/23 0313 12/26/23 0752  NA 140 139 136 136  K 4.5 3.6 5.2* 5.8*  CL 100 111 105 103  CO2 26 18* 16* 15*  GLUCOSE 83 198* 266* 274*  BUN 29* 30* 37* 39*  CREATININE 1.06* 1.13* 1.62* 2.14*  CALCIUM  9.3 7.3* 7.2* 7.0*   GFR: Estimated Creatinine Clearance: 19.4 mL/min (A) (by C-G formula based on SCr of 2.14 mg/dL (H)). Recent Labs  Lab 12/25/23 1242 12/25/23 1359 12/25/23 1644 12/25/23 1849 12/25/23 1921 12/26/23 0313 12/26/23 0700 12/26/23 0752  WBC  --    < >  --   --  14.2* 14.6* 13.3* 13.7*  LATICACIDVEN 1.2  --  1.0 1.5  --   --   --   --    < > = values in this interval not displayed.    Liver Function Tests: Recent Labs  Lab 12/20/23 1343 12/25/23 1359  AST 30 62*  ALT 17 38  ALKPHOS 59 34*  BILITOT 0.3 1.0  PROT 7.0 5.7*  ALBUMIN  4.4 3.2*   No results for input(s): LIPASE, AMYLASE in the last 168 hours. No results for input(s): AMMONIA in the last 168 hours.  ABG    Component Value Date/Time   PHART 7.357 11/22/2023 1229   PCO2ART 41.2 11/22/2023 1229   PO2ART 69 (L) 11/22/2023 1229   HCO3 25.4 11/22/2023 1232   TCO2 27 11/22/2023 1232   ACIDBASEDEF 1.0 11/22/2023 1232   O2SAT 43.7 12/26/2023 0752     Coagulation Profile: Recent Labs  Lab 12/20/23 1343  INR 1.0    Cardiac Enzymes: No results for input(s): CKTOTAL, CKMB, CKMBINDEX, TROPONINI in the last 168 hours.  HbA1C: No results found for: HGBA1C  CBG: No results for input(s): GLUCAP in the last 168 hours.  CC time: 32 mins   Rockie Myers, NP Pulmonary/Critical Care Medicine  12/26/2023  8:48 AM   See Tracey for personal  pager PCCM on call pager [phone] until 7pm. Please call Elink 7p-7a. [phone]

## 2023-12-26 NOTE — Interval H&P Note (Signed)
 History and Physical Interval Note:  12/26/2023 10:49 AM  [name]  has presented today for surgery, with the diagnosis of shock.  The various methods of treatment have been discussed with the patient and family. After consideration of risks, benefits and other options for treatment, the patient has consented to  Procedure(s): RIGHT/LEFT HEART CATH AND CORONARY ANGIOGRAPHY (N/A) as a surgical intervention.  The patient's history has been reviewed, patient examined, no change in status, stable for surgery.  I have reviewed the patient's chart and labs.  Questions were answered to the patient's satisfaction.     Morene [name]

## 2023-12-26 NOTE — Progress Notes (Signed)
 RT attempted to obtain respiratory culture without success. Pt had minimal secretions. Will attempt at a later time.

## 2023-12-26 NOTE — Procedures (Signed)
   TRANSESOPHAGEAL ECHOCARDIOGRAM  NAME:  [name]    MRN: [id number] DOB:  1940/10/20    ADMIT DATE: 12/25/2023  INDICATIONS: Hypotension, recent pericardial bleed  PROCEDURE:   Informed consent was obtained prior to the procedure. The risks, benefits and alternatives for the procedure were discussed and the patient comprehended these risks.  Risks include, but are not limited to, cough, sore throat, vomiting, nausea, somnolence, esophageal and stomach trauma or perforation, bleeding, low blood pressure, aspiration, pneumonia, infection, trauma to the teeth and death.    With sedation patient became progressively hypoxic requiring bag valve mask and worsening hypotension. She was intubated by Dr. Gretta at bedside prior to procedure.  The transesophageal probe was inserted in the esophagus and stomach without difficulty and multiple views were obtained.  The patient was kept under observation until the patient left the procedure room.  The patient left the procedure room in stable condition.    COMPLICATIONS:    Complications: No complications.  The patient had normal neuro status and respiratory status post procedure with vitals stable as recorded elsewhere.  Adequate airway was maintained throughout and vital signs monitored per protocol.  KEY FINDINGS:  Normal LV, moderately reduced RV function Biatrial dilation Small residual post procedure ASD with left to right shunting No evidence of pericardial effusion . Full Report to follow.   Morene Brownie Advanced Heart Failure 9:30 PM

## 2023-12-26 NOTE — Progress Notes (Signed)
 Planning for TEE for evaluation of posterior pericardial effusion not able to be seen on CT to TTE. RHC c/w tamponade.  Mallampati 3 ASA 4  Leita SHAUNNA Gaskins, DO 12/26/23 2:04 PM [facility]  For contact information, see Amion. If no response to pager, please call PCCM consult pager. After hours, 7PM- 7AM, please call Elink.

## 2023-12-26 NOTE — Procedures (Signed)
 Intubation Procedure Note  LASTACIA SOLUM  [id number]  Mar 22, 1941  Date:12/26/23  Time:2:50 PM   Provider Performing:Jarom Govan SHAUNNA Gaskins    Procedure: Intubation (31500)  Indication(s) Respiratory Failure  Consent Risks of the procedure as well as the alternatives and risks of each were explained to the patient and/or caregiver.  Consent for the procedure was obtained and is signed in the bedside chart   Anesthesia Versed , Rocuronium , and ketamine    Time Out Verified patient identification, verified procedure, site/side was marked, verified correct patient position, special equipment/implants available, medications/allergies/relevant history reviewed, required imaging and test results available.   Sterile Technique Usual hand hygeine, masks, and gloves were used   Procedure Description Patient positioned in bed supine.  Sedation given as noted above.  Patient was intubated with endotracheal tube using Glidescope.  View was Grade 1 full glottis .  Number of attempts was 1.  Colorimetric CO2 detector was consistent with tracheal placement.   Complications/Tolerance None; patient tolerated the procedure well. Chest X-ray is ordered to verify placement.   EBL Minimal   Specimen(s) None  Leita SHAUNNA Gaskins, DO 12/26/23 2:51 PM [facility]  For contact information, see Amion. If no response to pager, please call PCCM consult pager. After hours, 7PM- 7AM, please call Elink.

## 2023-12-26 NOTE — TOC CM/SW Note (Signed)
..  Transition of Care [facility]) - Inpatient Brief Assessment   Patient Details  Name: [name] MRN: [id number] Date of Birth: July 27, 1940  Transition of Care [facility]) CM/SW Contact:    Justina Delcia Czar, RN Phone Number: [phone] 12/26/2023, 5:13 PM   Clinical Narrative:  Inpatient CM spoke to pt's dtr, Nat. Dtr states pt lives alone and was independent pta. CM provided contact number and explained will assist with dc needs.   Will continue to follow for dc needs.   Transition of Care Asessment: Insurance and Status: Insurance coverage has been reviewed Patient has primary care physician: Yes Home environment has been reviewed: lives alone Prior level of function:: independent prior to home Prior/Current Home Services: No current home services Social Drivers of Health Review: SDOH reviewed no interventions necessary Readmission risk has been reviewed: Yes Transition of care needs: transition of care needs identified, TOC will continue to follow

## 2023-12-26 NOTE — Progress Notes (Addendum)
 Advanced Heart Failure Rounding Note  Cardiologist: Lurena MARLA Red, MD  Chief Complaint: S/P mTEER complicated by pericardial effusion Subjective:   -s/p pericardiocentesis 12/25/23. Repeat echo with no effusion  K 5.2. SCr 1.13>1.62. got 1L IVF yesterday. CVP 11/12  NE @ 7  Feels ok this morning. Complaining of chest and throat soreness. EKG this am with STE. Nauseous.   Objective:   Weight Range: 72.2 kg Body mass index is 25.31 kg/m.   Vital Signs:   Temp:  [97.6 F (36.4 C)-98.7 F (37.1 C)] 97.7 F (36.5 C) (09/11 0001) Pulse Rate:  [54-134] 69 (09/11 0600) Resp:  [10-34] 12 (09/11 0600) BP: (49-172)/(31-84) 96/55 (09/11 0400) SpO2:  [89 %-100 %] 93 % (09/11 0600) Arterial Line BP: (70-144)/(41-61) 103/45 (09/11 0600) Weight:  [72.2 kg] 72.2 kg (09/11 0300) Last BM Date :  (PTA)  Weight change: Filed Weights   12/25/23 0711 12/26/23 0300  Weight: 68 kg 72.2 kg   Intake/Output:   Intake/Output Summary (Last 24 hours) at 12/26/2023 0713 Last data filed at 12/26/2023 0600 Gross per 24 hour  Intake 3931.19 ml  Output 330 ml  Net 3601.19 ml    CVP 11/12 Physical Exam  General:  elderly appearing.  No respiratory difficulty Neck: JVD ~7 cm.  Cor: Regular rate & rhythm. No murmurs. Lungs: clear Extremities: no edema  Neuro: alert & oriented x 3. Affect pleasant.   Telemetry   NSR 60s 0-2 PVCs (Personally reviewed)    Labs    CBC Recent Labs    12/25/23 1921 12/26/23 0313  WBC 14.2* 14.6*  HGB 10.7* 10.8*  HCT 33.6* 34.3*  MCV 96.8 97.2  PLT 299 299   Basic Metabolic Panel Recent Labs    90/89/74 1359 12/26/23 0313  NA 139 136  K 3.6 5.2*  CL 111 105  CO2 18* 16*  GLUCOSE 198* 266*  BUN 30* 37*  CREATININE 1.13* 1.62*  CALCIUM  7.3* 7.2*   Liver Function Tests Recent Labs    12/25/23 1359  AST 62*  ALT 38  ALKPHOS 34*  BILITOT 1.0  PROT 5.7*  ALBUMIN  3.2*   No results for input(s): LIPASE, AMYLASE in the last 72  hours. Cardiac Enzymes No results for input(s): CKTOTAL, CKMB, CKMBINDEX, TROPONINI in the last 72 hours.  BNP: BNP (last 3 results) No results for input(s): BNP in the last 8760 hours.  ProBNP (last 3 results) Recent Labs    12/20/23 1343  PROBNP 940*   D-Dimer No results for input(s): DDIMER in the last 72 hours. Hemoglobin A1C No results for input(s): HGBA1C in the last 72 hours. Fasting Lipid Panel No results for input(s): CHOL, HDL, LDLCALC, TRIG, CHOLHDL, LDLDIRECT in the last 72 hours. Thyroid  Function Tests No results for input(s): TSH, T4TOTAL, T3FREE, THYROIDAB in the last 72 hours.  Invalid input(s): FREET3  Other results: Imaging   CARDIAC CATHETERIZATION Result Date: 12/25/2023 1.  Successful pericardial drain placement with evacuation of 400 cc of bloody fluid.  There was no recurrent effusion after a 10-minute waiting period by echocardiography. Summary: Successful pericardial drain placement to treat pericardial effusion complicating mitral transcatheter edge-to-edge repair.  The patient's daughter Nat was contacted and the results were reviewed with her by phone.   ECHOCARDIOGRAM LIMITED Result Date: 12/25/2023    ECHOCARDIOGRAM LIMITED REPORT   Patient Name:   [name] Date of Exam: 12/25/2023 Medical Rec #:  [id number]      Height:  66.5 in Accession #:    [id number]     Weight:       150.0 lb Date of Birth:  21-Aug-1940      BSA:          1.780 m Patient Age:    83 years       BP:           109/60 mmHg Patient Gender: F              HR:           97 bpm. Exam Location:  Inpatient Procedure: 2D Echo and Limited Echo (Both Spectral and Color Flow Doppler were            utilized during procedure). Indications:     Pericardial effusion  History:         Patient has prior history of Echocardiogram examinations, most                  recent 12/25/2023.  Sonographer:     Therisa Crouch Referring Phys:  [id number] AMY D CLEGG  Diagnosing Phys: Vinie Maxcy MD IMPRESSIONS  1. Limited echo follow-up of pericardial effusion, s/p pericardiocentesis.. Left ventricular ejection fraction, by estimation, is 65 to 70%. The left ventricle has normal function. The left ventricle has no regional wall motion abnormalities.  2. No pericardial effusion.  3. The mitral valve has been repaired/replaced. MItra-clip appears stable compared to prior study. Dopplers not performed to assess degree of mitral stenosis or regurgitation (if any)  4. The inferior vena cava is normal in size with <50% respiratory variability, suggesting right atrial pressure of 8 mmHg. Comparison(s): No significant change from prior study. Conclusion(s)/Recommendation(s): Critical findings reported to Greig Mosses, NP and acknowledged at 2:40 pm on 12/25/2023. FINDINGS  Left Ventricle: Limited echo follow-up of pericardial effusion, s/p pericardiocentesis. Left ventricular ejection fraction, by estimation, is 65 to 70%. The left ventricle has normal function. The left ventricle has no regional wall motion abnormalities. Pericardium: No pericardial effusion. There is no evidence of pericardial effusion. Mitral Valve: Dopplers not performed to assess degree of mitral stenosis or regurgitation (if any). The mitral valve has been repaired/replaced. There is a Mitra-Clip present in the mitral position. Venous: The inferior vena cava is normal in size with less than 50% respiratory variability, suggesting right atrial pressure of 8 mmHg. Vinie Maxcy MD Electronically signed by Vinie Maxcy MD Signature Date/Time: 12/25/2023/2:40:21 PM    Final (Updated)    ECHOCARDIOGRAM LIMITED Result Date: 12/25/2023    ECHOCARDIOGRAM LIMITED REPORT   Patient Name:   [name] Date of Exam: 12/25/2023 Medical Rec #:  [id number]      Height:       66.5 in Accession #:    [id number]     Weight:       150.0 lb Date of Birth:  January 31, 1941      BSA:          1.780 m Patient Age:    83 years       BP:            70/42 mmHg Patient Gender: F              HR:           102 bpm. Exam Location:  Inpatient Procedure: Limited Echo (Both Spectral and Color Flow Doppler were utilized            during procedure). Indications:    Pericardiocentisis  History:        Patient has prior history of Echocardiogram examinations, most                 recent 12/25/2023.  Sonographer:    Jayson Gaskins Referring Phys: [id number] ARUN K THUKKANI IMPRESSIONS  1. Limited echo to support pericardiocentesis. Left ventricular ejection fraction, by estimation, is 60 to 65%. The left ventricle has normal function.  2. Reported drainage of 350 cc fluid. Noted to be no pericardial effusion after drainage. Moderate pericardial effusion. The pericardial effusion is circumferential.  3. Color doppler was not performed. The mitral valve has been repaired/replaced. A mitra-clip device is noted on the mitral valve leaflets. Comparison(s): No significant change from prior study. 12/25/2023 (TEE): LVEF 60-65%, severe MR. FINDINGS  Left Ventricle: Limited echo to support pericardiocentesis. Left ventricular ejection fraction, by estimation, is 60 to 65%. The left ventricle has normal function. Pericardium: Reported drainage of 350 cc fluid. Noted to be no pericardial effusion after drainage. A moderately sized pericardial effusion is present. The pericardial effusion is circumferential. Mitral Valve: Color doppler was not performed. The mitral valve has been repaired/replaced. There is a Mitra-Clip present in the mitral position. Vinie Maxcy MD Electronically signed by Vinie Maxcy MD Signature Date/Time: 12/25/2023/2:35:27 PM    Final    DG CHEST PORT 1 VIEW Result Date: 12/25/2023 CLINICAL DATA:  Central line placement. EXAM: PORTABLE CHEST 1 VIEW COMPARISON:  Chest radiograph dated 12/25/2023 FINDINGS: Right IJ central venous catheter with tip over the right atrium. No pneumothorax. Cardiomegaly with vascular congestion. Bibasilar atelectasis or  infiltrate, right greater than left. A small left pleural effusion noted. Atherosclerotic calcification of the aorta. No acute osseous pathology. IMPRESSION: 1. Right IJ central venous catheter with tip over the right atrium. No pneumothorax. 2. Cardiomegaly with vascular congestion. 3. Bibasilar atelectasis or infiltrate, right greater than left. Electronically Signed   By: Vanetta Chou M.D.   On: 12/25/2023 13:51   ECHO TEE Result Date: 12/25/2023    TRANSESOPHOGEAL ECHO REPORT   Patient Name:   [name] Date of Exam: 12/25/2023 Medical Rec #:  [id number]      Height:       66.5 in Accession #:    [id number]     Weight:       150.0 lb Date of Birth:  22-Jun-1940      BSA:          1.780 m Patient Age:    83 years       BP:           144/57 mmHg Patient Gender: F              HR:           67 bpm. Exam Location:  Inpatient Procedure: Transesophageal Echo, 3D Echo, Color Doppler and Cardiac Doppler            (Both Spectral and Color Flow Doppler were utilized during            procedure). Indications:     Mitral Regurgitation i34.0  History:         Patient has prior history of Echocardiogram examinations, most                  recent 11/22/2023. CAD; Risk Factors:Hypertension.  Sonographer:     Damien Senior RDCS Referring Phys:  [id number] ARUN K THUKKANI Diagnosing Phys: Maude Emmer MD PROCEDURE: After discussion of the risks and benefits of  a TEE, an informed consent was obtained from the patient. The transesophogeal probe was passed without difficulty through the esophogus of the patient. Sedation performed by different physician. The patient developed no complications during the procedure. Patient under general anesthesia for clip implantation.  IMPRESSIONS  1. Left ventricular ejection fraction, by estimation, is 60 to 65%. The left ventricle has normal function.  2. Right ventricular systolic function is normal. The right ventricular size is normal.  3. Left atrial size was severely dilated. No left  atrial/left atrial appendage thrombus was detected.  4. Right atrial size was moderately dilated.  5. Extensive 3D imaging of MV done to guide mTEER. Pirior to intervention P3 flail with severe MR. Total reversal of upper PVls , ERO 0.88 cm2 and RV 135 cc Post mTEER Two successfully placed ACE devices Good 3D bridge. Trivial MR seen between the two clips. Mean gradient 7 peak 11 mmHg at HR of 66 bpm. . 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  6. Prolapse of septal and posterior leaflets by 3D imaging. The tricuspid valve is abnormal. 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  7. The aortic valve is tricuspid. There is mild calcification of the aortic valve. Aortic valve regurgitation is mild. Aortic valve sclerosis is present, with no evidence of aortic valve stenosis.  8. PFO present prior to trans septal puncture. Post trans septal puncture only left to right shunting.  9. 3D performed of the mitral valve and 3D performed of the tricuspid valve and demonstrates Used to guide mTEER procedure and image TV. 10. Patient under general anesthesia for clip implantation. FINDINGS  Left Ventricle: Left ventricular ejection fraction, by estimation, is 60 to 65%. The left ventricle has normal function. The left ventricular internal cavity size was normal in size. Right Ventricle: The right ventricular size is normal. Right vetricular wall thickness was not assessed. Right ventricular systolic function is normal. Left Atrium: Left atrial size was severely dilated. No left atrial/left atrial appendage thrombus was detected. Right Atrium: Right atrial size was moderately dilated. Pericardium: There is no evidence of pericardial effusion. Mitral Valve: Extensive 3D imaging of MV done to guide mTEER. Pirior to intervention P3 flail with severe MR. Total reversal of upper PVls , ERO 0.88 cm2 and RV 135 cc Post mTEER Two successfully placed ACE devices Good 3D bridge. Trivial MR seen between   the two clips. Mean gradient 7 peak 11 mmHg at HR of 66 bpm. 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 Tricuspid Valve: Prolapse of septal and posterior leaflets by 3D imaging. The tricuspid valve is abnormal. 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 Aortic Valve: The aortic valve is tricuspid. There is mild calcification of the aortic valve. Aortic valve regurgitation is mild. Aortic valve sclerosis is present, with no evidence of aortic valve stenosis. Pulmonic Valve: The pulmonic valve was normal in structure. Pulmonic valve regurgitation is mild. Aorta: The aortic root is normal in size and structure. IAS/Shunts: PFO present prior to trans septal puncture. Post trans septal puncture only left to right shunting. Additional Comments: 3D was performed not requiring image post processing on an independent workstation and was abnormal. Spectral Doppler performed. Maude Emmer MD Electronically signed by Maude Emmer MD Signature Date/Time: 12/25/2023/10:49:23 AM    Final    EP STUDY Result Date: 12/25/2023 See surgical note for result.  Structural Heart Procedure Result Date: 12/25/2023 See surgical note for result.  DG Chest 2 View Result Date: 12/25/2023 CLINICAL DATA:  Preop for mitral valve repair.  Dyspnea on exertion. EXAM: CHEST - 2 VIEW COMPARISON:  February 08, 2023. FINDINGS: The heart size and mediastinal contours are within normal limits. Minimal right basilar scarring is noted. No definite acute pulmonary abnormality is noted. Old right rib fractures are again noted. IMPRESSION: No active cardiopulmonary disease. Electronically Signed   By: Lynwood Landy Raddle M.D.   On: 12/25/2023 08:30   Medications:   Scheduled Medications:  aspirin  EC  81 mg Oral Daily   [START ON 12/27/2023] atorvastatin   10 mg Oral Once per day on Monday Wednesday Friday   Chlorhexidine  Gluconate Cloth  6 each Topical Daily   cholecalciferol   1,000 Units Oral Daily   clopidogrel   75 mg  Oral Daily   colchicine   0.6 mg Oral BID   escitalopram   5 mg Oral Daily   melatonin  1.5 mg Oral QHS   sodium chloride  flush  3 mL Intravenous Q12H    Infusions:  sodium chloride      norepinephrine  (LEVOPHED ) Adult infusion 8 mcg/min (12/26/23 0600)    PRN Medications: sodium chloride , acetaminophen , ALPRAZolam , azelastine , famotidine , hydrALAZINE , HYDROmorphone  (DILAUDID ) injection, labetalol , methocarbamol , nitroGLYCERIN , ondansetron  (ZOFRAN ) IV, sodium chloride  flush  Patient Profile  Ms Woodrum is a 83 year old with history of mitral valve disease, carotid disease, breast cancer treated with resection, and 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 Admitted to ICU post Mitral Clip complicated by Pericardial Effusion and shock.  Assessment/Plan   1. S/P mTEER complicated by pericardial effusion Pericardial Effusion post mitral clip. Urgent Pericardiocentesis with 350 cc of bloody output.  Plan to renally dose colchicine  0.6 mg to daily today with elevated SCr Repeat echo post pericardiocentesis with well placed clip with mild gradient 8 mmHh, minimal MR, mild RV dysfunction, mod-severe TR, no effusion Continue NE @ 7. Titrate for goal MAP >65.  Co-ox yesterday 59%. Repeat today.  Lactic acid stable.  Limited echo today   2. Mitral Valve Disease, S/P MTEER , Clip x2.  See above.    3. CAD 11/22/23 DES RCA.  Continue DAPT (ASA/plavix )  4. AKI  SCr 1.13>1.62  Baseline SCr ~.9 Avoid hypotension May need diuresis, CVP 11/12 but appears euvolemic on exam. Weight up 9lbs from yesterday Repeat BMET  5. Hyperkalemia K 5.2. Repeat BMET. Did not get K replacement yesterday  Addendum 12/26/23 10:17 AM  Patient with worsening hemodynamics, labs and increased pressor requirements. Lactic acid 5.6. Now in CGS. Co-ox 44%. Repeat echo with worsening RV.   Start vaso 0.04. Plan for stat L/RHC followed by possible TEE.   SCr 1.62>2.14 in 4hrs.   K 5.8. CaGlu / insuli / dextrose  and 2 amps  bicarb ordered.   Ongoing nausea, added compazine .   Informed Consent   Shared Decision Making/Informed Consent The risks [stroke (1 in 1000), death (1 in 1000), kidney failure [usually temporary] (1 in 500), bleeding (1 in 200), allergic reaction [possibly serious] (1 in 200)], benefits (diagnostic support and management of coronary artery disease) and alternatives of a cardiac catheterization were discussed in detail with Ms. Flynn and she is willing to proceed.     CRITICAL CARE Performed by: Beckey LITTIE Coe   Total critical care time: 20 minutes  Critical care time was exclusive of separately billable procedures and treating other patients.  Critical care was necessary to treat or prevent imminent or life-threatening deterioration.  Critical care was time spent personally by me on the following activities: development of treatment plan with  patient and/or surrogate as well as nursing, discussions with consultants, evaluation of patient's response to treatment, examination of patient, obtaining history from patient or surrogate, ordering and performing treatments and interventions, ordering and review of laboratory studies, ordering and review of radiographic studies, pulse oximetry and re-evaluation of patient's condition.   Length of Stay: 1  Beckey LITTIE Coe, NP  12/26/2023, 7:13 AM  Advanced Heart Failure Team Pager [phone] (M-F; 7a - 5p)  Please contact [facility] for night-coverage after hours (5p -7a ) and weekends on [email or web address]

## 2023-12-26 NOTE — Plan of Care (Signed)
  Problem: Clinical Measurements: Goal: Cardiovascular complication will be avoided Outcome: Progressing   Problem: Clinical Measurements: Goal: Respiratory complications will improve Outcome: Progressing   Problem: Clinical Measurements: Goal: Diagnostic test results will improve Outcome: Not Progressing

## 2023-12-26 NOTE — Consult Note (Addendum)
 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  She underwent evaluation for surgical intervention, however she was not felt to be a good candidate for conventional mitral valve replacement/repair.  She was also undergoing evaluation for placement of a MitraClip to repair her Mitral Regurgitation.  She presented to [facility] on 12/25/2023 and underwent Mitral edge to edge repair with PASCAL ACE x 2 by Dr. Perrin and Dr. Wonda.  Post operative echocardiogram showed trace residual MR.  Her LV function remained unchanged.  However, post procedure she developed hypotension, acute shortness of breath, and chest pain.  She was treated with Albumin .  She developed Altered Mental Status in the recovery room.  She underwent STAT repeat Echocardiogram which showed a large pericardial effusion.  She underwent emergent pericardiocentesis with removal of 350 cc of fluid and placement of drain.  She was transferred to the ICU with improvement of blood pressure.  Advanced heart failure was obtained and she was treated with 1L of NS and started on Levophed  for additional support.  Central line was also placed for monitoring of CVP and Co-ox.  She developed ST changes on EKG with chest pain, neck pain, and N/V.  Repeat Echocardiogram showed minimal MR, well placed Mitral valve clip, mild RV dysfunction which was worse than previous, and mod-severe TR.  There was no further effusion noted.  Patient was started on Vasopressin  by AHF.  She was taken for urgent cardiac  catheterization which showed evidence of cardiac tamponade which appears to be clot. Cardiothoracic surgery consultation is requested for possible pericardial window creation.  Currently, patient is sleeping.  She continues to have upper chest, neck, and back pain.  She is having intermittent nausea.    Current Activity/ Functional Status: Patient was independent with mobility/ambulation, transfers, ADL's, IADL's.   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   Osteoporosis    S/P mitral valve clip implantation 12/25/2023   s/p PASCAL ACE placed at A3/P3 by Dr. Wendel   Past Surgical History:  Procedure Laterality Date   ABDOMINAL HYSTERECTOMY     partial   APPENDECTOMY     BREAST EXCISIONAL BIOPSY Bilateral    benign   BREAST SURGERY Bilateral    bx    CORONARY STENT INTERVENTION N/A 11/22/2023   Procedure: CORONARY STENT INTERVENTION;  Surgeon: Rivera Lurena MARLA, MD;  Location: [facility] INVASIVE CV LAB;  Service: Cardiovascular;  Laterality: N/A;   EYE SURGERY Bilateral    cataracts   LUNG REMOVAL, PARTIAL Right 01/26/2013   for Stage IA non small cell   PERICARDIOCENTESIS N/A 12/25/2023   Procedure: PERICARDIOCENTESIS;  Surgeon: Thukkani, Melinda K,  MD;  Location: [facility] INVASIVE CV LAB;  Service: Cardiovascular;  Laterality: N/A;   RIGHT/LEFT HEART CATH AND CORONARY ANGIOGRAPHY N/A 11/22/2023   Procedure: RIGHT/LEFT HEART CATH AND CORONARY ANGIOGRAPHY;  Surgeon: Rivera Lurena POUR, MD;  Location: [facility] INVASIVE CV LAB;  Service: Cardiovascular;  Laterality: N/A;   TONSILLECTOMY     TRANSCATHETER MITRAL EDGE TO EDGE REPAIR N/A 12/25/2023   Procedure: TRANSCATHETER MITRAL EDGE TO EDGE REPAIR;  Surgeon: Rivera Lurena POUR, MD;  Location: [facility] INVASIVE CV LAB;  Service:  Cardiovascular;  Laterality: N/A;   TRANSESOPHAGEAL ECHOCARDIOGRAM (CATH LAB) N/A 11/22/2023   Procedure: TRANSESOPHAGEAL ECHOCARDIOGRAM;  Surgeon: Melinda Maude BROCKS, MD;  Location: [facility] INVASIVE CV LAB;  Service: Cardiovascular;  Laterality: N/A;   TRANSESOPHAGEAL ECHOCARDIOGRAM (CATH LAB) N/A 12/25/2023   Procedure: TRANSESOPHAGEAL ECHOCARDIOGRAM;  Surgeon: Rivera Lurena POUR, MD;  Location: [facility] INVAS[facility] CV LAB;  Service: Cardiovascular;  Laterality: N/A;   VIDEO ASSISTED THORACOSCOPY (VATS)/WEDGE RESECTION Right 01/26/2013   Procedure: VIDEO ASSISTED THORACOSCOPY (VATS) with wedge resection right middle lobe nodule , segmental resection rigth lower lobe lung, lymph node sampling,;  Surgeon: Elspeth Rivera Millers, MD;  Location: [facility] OR;  Service: Thoracic;  Laterality: Right;    Social History   Tobacco Use  Smoking Status Former   Current packs/day: 0.00   Average packs/day: 1 pack/day for 40.0 years (40.0 ttl pk-yrs)   Types: Cigarettes   Start date: 04/16/1965   Quit date: 04/16/2005   Years since quitting: 18.7  Smokeless Tobacco Never    Social History   Substance and Sexual Activity  Alcohol  Use No    Allergies  Allergen Reactions   Doxycycline Other (See Comments)    Upset stomach   Erythromycin Other (See Comments)    Upset stomach    Current Facility-Administered Medications  Medication Dose Route Frequency Provider Last Rate Last Admin   0.9 %  sodium chloride  infusion  250 mL Intravenous PRN Thompson, Kathryn R, PA-C       acetaminophen  (TYLENOL ) tablet 650 mg  650 mg Oral Q4H PRN Thompson, Kathryn R, PA-C       ALPRAZolam  (XANAX ) tablet 0.25 mg  0.25 mg Oral Daily PRN Thompson, Kathryn R, PA-C       aspirin  EC tablet 81 mg  81 mg Oral Daily Thompson, Kathryn R, PA-C   81 mg at 12/26/23 1010   [START ON 12/27/2023] atorvastatin  (LIPITOR) tablet 10 mg  10 mg Oral Once per day on Monday Wednesday Friday Melinda Lamarr SAUNDERS, PA-C       azelastine  (ASTELIN ) 0.1 % nasal spray 1 spray   1 spray Each Nare BID PRN Melinda Lamarr SAUNDERS, PA-C       Chlorhexidine  Gluconate Cloth 2 % PADS 6 each  6 each Topical Daily Thukkani, Melinda K, MD       cholecalciferol  (VITAMIN D3) 25 MCG (1000 UNIT) tablet 1,000 Units  1,000 Units Oral Daily Melinda Lamarr SAUNDERS, PA-C       clopidogrel  (PLAVIX ) tablet 75 mg  75 mg Oral Daily Thompson, Kathryn R, PA-C   75 mg at 12/26/23 1010   colchicine  tablet 0.6 mg  0.6 mg Oral Daily Hayes Beckey CROME, NP       escitalopram  (LEXAPRO ) tablet 5 mg  5 mg Oral Daily Melinda Lamarr SAUNDERS, PA-C       famotidine  (PEPCID ) tablet 10 mg  10 mg Oral Daily PRN Thompson, Kathryn R, PA-C       HYDROmorphone  (DILAUDID ) injection 1 mg  1 mg  Intravenous Q2H PRN Lenetta, Amy D, NP   1 mg at 12/26/23 9070   melatonin tablet 1.5 mg  1.5 mg Oral QHS Thukkani, Melinda K, MD       methocarbamol  (ROBAXIN ) tablet 500 mg  500 mg Oral Q8H PRN Thompson, Kathryn R, PA-C       nitroGLYCERIN  (NITROSTAT ) SL tablet 0.4 mg  0.4 mg Sublingual Q5 min PRN Thompson, Kathryn R, PA-C       norepinephrine  (LEVOPHED ) 4mg  in (0.016 mg/mL) premix infusion  0-40 mcg/min Intravenous Titrated Hayes Lander L, NP 30 mL/hr at 12/26/23 1000 8 mcg/min at 12/26/23 1000   prochlorperazine  (COMPAZINE ) injection 10 mg  10 mg Intravenous Q4H PRN Hayes Lander CROME, NP   10 mg at 12/26/23 1033   sodium chloride  flush (NS) 0.9 % injection 3 mL  3 mL Intravenous Q12H Melinda Lamarr SAUNDERS, PA-C   3 mL at 12/25/23 2200   sodium chloride  flush (NS) 0.9 % injection 3 mL  3 mL Intravenous PRN Thompson, Kathryn R, PA-C       vasopressin  (PITRESSIN) 20 Units in 100 mL (0.2 unit/mL) infusion-*FOR SHOCK*  0-0.04 Units/min Intravenous Continuous Zenaida Morene PARAS, MD 9 mL/hr at 12/26/23 1000 0.03 Units/min at 12/26/23 1000    Medications Prior to Admission  Medication Sig Dispense Refill Last Dose/Taking   ALPRAZolam  (XANAX ) 0.25 MG tablet Take 0.25 mg by mouth daily as needed for anxiety (prior to air travel or dental appointments).    12/24/2023   amLODipine  (NORVASC ) 2.5 MG tablet Take 1 tablet (2.5 mg total) by mouth daily. 90 tablet 3 12/25/2023 at  5:30 AM   aspirin  EC 81 MG tablet Take 1 tablet (81 mg total) by mouth daily. Swallow whole. 30 tablet 0 12/25/2023 at  5:30 AM   atorvastatin  (LIPITOR) 10 MG tablet TAKE 1 TABLET (10 MG TOTAL) BY MOUTH 3 (THREE) TIMES A WEEK. TAKES M-W-F. 36 tablet 3 Past Week   azelastine  (ASTELIN ) 0.1 % nasal spray Place 1 spray into both nostrils 2 (two) times daily as needed for rhinitis or allergies.   Past Week   Bempedoic Acid  (NEXLETOL ) 180 MG TABS Take 1 tablet (180 mg total) by mouth daily. 90 tablet 3 12/24/2023   cholecalciferol  (VITAMIN D3) 25 MCG (1000 UNIT) tablet Take 1,000 Units by mouth daily.   12/24/2023   clopidogrel  (PLAVIX ) 75 MG tablet Take 1 tablet (75 mg total) by mouth daily. 90 tablet 3 12/25/2023 at  5:30 AM   escitalopram  (LEXAPRO ) 5 MG tablet Take 5 mg by mouth daily.   12/24/2023   fluticasone (FLONASE ALLERGY RELIEF) 50 MCG/ACT nasal spray Place 1 spray into both nostrils daily as needed for allergies or rhinitis.   Past Month   furosemide  (LASIX ) 20 MG tablet Take 1 tablet (20 mg total) by mouth 2 (two) times daily. 180 tablet 3 12/24/2023   linaclotide (LINZESS) 72 MCG capsule Take 72 mcg by mouth daily before breakfast. Per patient skips taking it when stomach is upset. (Patient taking differently: Take 72 mcg by mouth as needed. Per patient skips taking it when stomach is upset.)   Past Week   meclizine  (ANTIVERT ) 25 MG tablet Take 25 mg by mouth 3 (three) times daily as needed for dizziness.    Past Month   Melatonin 3 MG TBDP Take 1.5 mg by mouth at bedtime.   12/24/2023   Multiple Vitamin (MULTIVITAMIN WITH MINERALS) TABS tablet Take 1 tablet by mouth every other day.   Past Week  Omega-3 Krill Oil 500 MG CAPS Take 500 mg by mouth daily.   12/24/2023   Polyvinyl Alcohol -Povidone (REFRESH OP) Place 1 drop into both eyes every 6 (six) hours as needed (dry eyes).   12/24/2023    denosumab  (PROLIA ) 60 MG/ML SOSY injection Inject 60 mg into the skin every 6 (six) months.   More than a month   famotidine  (PEPCID  AC) 10 MG tablet Take 10 mg by mouth daily as needed for heartburn or indigestion.   More than a month   meloxicam  (MOBIC ) 15 MG tablet Take 15 mg by mouth daily as needed for pain.    More than a month   methocarbamol  (ROBAXIN ) 500 MG tablet Take 500 mg by mouth every 8 (eight) hours as needed for muscle spasms.   More than a month   nitroGLYCERIN  (NITROSTAT ) 0.4 MG SL tablet Place 1 tablet (0.4 mg total) under the tongue every 5 (five) minutes as needed for chest pain. 25 tablet 2     Family History  Problem Relation Age of Onset   Lung cancer Father    Diabetes Mellitus II Father    Hypertension Father    Hyperlipidemia Father    Cancer - Other Brother      Review of Systems:   ROS    Cardiac Review of Systems: Y or  [    ]= no  Chest Pain [  Y  ]  Resting SOB [   ] Exertional SOB  [ Y ]  Orthopnea [  ]   Pedal Edema [ Y  ]    Palpitations [  ] Syncope  [  ]   Presyncope [   ]  General Review of Systems: [Y] = yes [  ]=no Constitional: recent weight change [  ]; anorexia [  ]; fatigue [  ]; nausea [ Y ]; night sweats [  ]; fever [  ]; or chills [  ]                                                               Dental: Last Dentist visit:   Eye : blurred vision [  ]; diplopia [   ]; vision changes [  ];  Amaurosis fugax[  ]; Resp: cough [  ];  wheezing[  ];  hemoptysis[  ]; shortness of breath[  ]; paroxysmal nocturnal dyspnea[  ]; dyspnea on exertion[Y  ]; or orthopnea[  ];  GI:  gallstones[  ], vomiting[Y  ];  dysphagia[  ]; melena[  ];  hematochezia [  ]; heartburn[  ];   Hx of  Colonoscopy[  ]; GU: kidney stones [  ]; hematuria[  ];   dysuria [  ];  nocturia[  ];  history of     obstruction [  ]; urinary frequency [  ]             Skin: rash, swelling[  ];, hair loss[  ];  peripheral edema[  ];  or itching[  ]; Musculosketetal: myalgias[  ];  joint  swelling[  ];  joint erythema[  ];  joint pain[  ];  back pain[  ];  Heme/Lymph: bruising[  ];  bleeding[  ];  anemia[  ];  Neuro: TIA[  ];  headaches[  ];  stroke[  ];  vertigo[  ];  seizures[  ];   paresthesias[  ];  difficulty walking[  ];  Psych:depression[  ]; anxiety[  ];  Endocrine: diabetes[N  ];  thyroid  dysfunction[ N ];  Physical Exam: BP (!) 84/64   Pulse 68   Temp 97.7 F (36.5 C) (Axillary)   Resp 13   Ht 5' 6.5 (1.689 m)   Wt 72.2 kg   SpO2 93%   BMI 25.31 kg/m   General appearance: alert, cooperative, and no distress Resp: clear to auscultation bilaterally Cardio: regular rate and rhythm GI: soft, non-tender; bowel sounds normal; no masses,  no organomegaly Extremities: extremities normal, atraumatic, no cyanosis or edema Neurologic: Grossly normal  Diagnostic Studies & Laboratory data:     Recent Radiology Findings:   ECHOCARDIOGRAM COMPLETE Result Date: 12/26/2023    ECHOCARDIOGRAM REPORT   Patient Name:   [name] Date of Exam: 12/26/2023 Medical Rec #:  [id number]      Height:       66.5 in Accession #:    [id number]     Weight:       159.2 lb Date of Birth:  05-01-1940      BSA:          1.825 m Patient Age:    83 years       BP:           99/50 mmHg Patient Gender: F              HR:           67 bpm. Exam Location:  Inpatient Procedure: 2D Echo, Color Doppler and Cardiac Doppler (Both Spectral and Color            Flow Doppler were utilized during procedure). Indications:    S/P mitral valve repair  History:        Patient has prior history of Echocardiogram examinations, most                 recent 12/25/2023. Mitral Valve Disease.  Sonographer:    Jayson Gaskins Referring Phys: [id number] KATHRYN Rivera THOMPSON IMPRESSIONS  1. Left ventricular ejection fraction, by estimation, is 60 to 65%. The left ventricle has normal function. The left ventricle has no regional wall motion abnormalities. There is mild concentric left ventricular hypertrophy. Left ventricular  diastolic function could not be evaluated.  2. Right ventricular systolic function is normal. The right ventricular size is mildly enlarged.  3. Status post mitral clip, well seated in the A2-P2 position. The mitral valve has been repaired/replaced. No evidence of mitral valve regurgitation. Function mild mitral stenosis mitral stenosis.  4. Tricuspid valve regurgitation is mild to moderate.  5. The aortic valve is normal in structure. Aortic valve regurgitation is trivial. Aortic valve sclerosis is present, with no evidence of aortic valve stenosis.  6. The inferior vena cava is normal in size with greater than 50% respiratory variability, suggesting right atrial pressure of 3 mmHg. FINDINGS  Left Ventricle: Left ventricular ejection fraction, by estimation, is 60 to 65%. The left ventricle has normal function. The left ventricle has no regional wall motion abnormalities. The left ventricular internal cavity size was normal in size. There is  mild concentric left ventricular hypertrophy. Left ventricular diastolic function could not be evaluated due to mitral valve replacement. Left ventricular diastolic function could not be evaluated. Right Ventricle: The right ventricular size is mildly enlarged. No increase in right ventricular wall thickness. Right ventricular  systolic function is normal. Left Atrium: Left atrial size was normal in size. Right Atrium: Right atrial size was normal in size. Pericardium: There is no evidence of pericardial effusion. Presence of epicardial fat layer. Mitral Valve: Status post mitral clip, well seated in the A2-P2 position. The mitral valve has been repaired/replaced. No evidence of mitral valve regurgitation. There is a PASCAL ACE present in the mitral position. Function mild mitral stenosis mitral valve stenosis. MV peak gradient, 8.6 mmHg. The mean mitral valve gradient is 4.7 mmHg. Tricuspid Valve: The tricuspid valve is normal in structure. Tricuspid valve regurgitation is  mild to moderate. No evidence of tricuspid stenosis. Aortic Valve: The aortic valve is normal in structure. Aortic valve regurgitation is trivial. Aortic valve sclerosis is present, with no evidence of aortic valve stenosis. Aortic valve mean gradient measures 2.0 mmHg. Aortic valve peak gradient measures 3.4 mmHg. Aortic valve area, by VTI measures 2.17 cm. Pulmonic Valve: The pulmonic valve was normal in structure. Pulmonic valve regurgitation is not visualized. No evidence of pulmonic stenosis. Aorta: The aortic root is normal in size and structure. Venous: The inferior vena cava is normal in size with greater than 50% respiratory variability, suggesting right atrial pressure of 3 mmHg. IAS/Shunts: No atrial level shunt detected by color flow Doppler.  LEFT VENTRICLE PLAX 2D LVIDd:         4.05 cm   Diastology LVIDs:         2.79 cm   LV e' medial:    8.27 cm/s LV PW:         1.04 cm   LV E/e' medial:  15.8 LV IVS:        1.14 cm   LV e' lateral:   7.40 cm/s LVOT diam:     1.72 cm   LV E/e' lateral: 17.7 LV SV:         29 LV SV Index:   16 LVOT Area:     2.32 cm  LEFT ATRIUM             Index LA Vol (A2C):   44.2 ml 24.22 ml/m LA Vol (A4C):   51.0 ml 27.94 ml/m LA Biplane Vol: 49.6 ml 27.18 ml/m  AORTIC VALVE AV Area (Vmax):    2.16 cm AV Area (Vmean):   1.87 cm AV Area (VTI):     2.17 cm AV Vmax:           91.60 cm/s AV Vmean:          66.200 cm/s AV VTI:            0.134 m AV Peak Grad:      3.4 mmHg AV Mean Grad:      2.0 mmHg LVOT Vmax:         85.30 cm/s LVOT Vmean:        53.400 cm/s LVOT VTI:          0.125 m LVOT/AV VTI ratio: 0.93  AORTA Ao Root diam: 2.51 cm MITRAL VALVE MV Area (PHT): 1.12 cm     SHUNTS MV Area VTI:   0.48 cm     Systemic VTI:  0.12 m MV Peak grad:  8.6 mmHg     Systemic Diam: 1.72 cm MV Mean grad:  4.7 mmHg MV Vmax:       1.47 m/s MV Vmean:      102.9 cm/s MV Decel Time: 676 msec MV E velocity: 131.00 cm/s MV A velocity: 89.50 cm/s MV E/A  ratio:  1.46 Kardie Tobb DO  Electronically signed by Kardie Tobb DO Signature Date/Time: 12/26/2023/11:10:58 AM    Final    CARDIAC CATHETERIZATION Result Date: 12/25/2023 1.  Successful pericardial drain placement with evacuation of 400 cc of bloody fluid.  There was no recurrent effusion after a 10-minute waiting period by echocardiography. Summary: Successful pericardial drain placement to treat pericardial effusion complicating mitral transcatheter edge-to-edge repair.  The patient's daughter Nat was contacted and the results were reviewed with her by phone.   ECHOCARDIOGRAM LIMITED Result Date: 12/25/2023    ECHOCARDIOGRAM LIMITED REPORT   Patient Name:   [name] Date of Exam: 12/25/2023 Medical Rec #:  [id number]      Height:       66.5 in Accession #:    [id number]     Weight:       150.0 lb Date of Birth:  February 21, 1941      BSA:          1.780 m Patient Age:    83 years       BP:           109/60 mmHg Patient Gender: F              HR:           97 bpm. Exam Location:  Inpatient Procedure: 2D Echo and Limited Echo (Both Spectral and Color Flow Doppler were            utilized during procedure). Indications:     Pericardial effusion  History:         Patient has prior history of Echocardiogram examinations, most                  recent 12/25/2023.  Sonographer:     Therisa Crouch Referring Phys:  [id number] AMY D CLEGG Diagnosing Phys: Vinie Maxcy MD IMPRESSIONS  1. Limited echo follow-up of pericardial effusion, s/p pericardiocentesis.. Left ventricular ejection fraction, by estimation, is 65 to 70%. The left ventricle has normal function. The left ventricle has no regional wall motion abnormalities.  2. No pericardial effusion.  3. The mitral valve has been repaired/replaced. MItra-clip appears stable compared to prior study. Dopplers not performed to assess degree of mitral stenosis or regurgitation (if any)  4. The inferior vena cava is normal in size with <50% respiratory variability, suggesting right atrial pressure of 8  mmHg. Comparison(s): No significant change from prior study. Conclusion(s)/Recommendation(s): Critical findings reported to Greig Mosses, NP and acknowledged at 2:40 pm on 12/25/2023. FINDINGS  Left Ventricle: Limited echo follow-up of pericardial effusion, s/p pericardiocentesis. Left ventricular ejection fraction, by estimation, is 65 to 70%. The left ventricle has normal function. The left ventricle has no regional wall motion abnormalities. Pericardium: No pericardial effusion. There is no evidence of pericardial effusion. Mitral Valve: Dopplers not performed to assess degree of mitral stenosis or regurgitation (if any). The mitral valve has been repaired/replaced. There is a Mitra-Clip present in the mitral position. Venous: The inferior vena cava is normal in size with less than 50% respiratory variability, suggesting right atrial pressure of 8 mmHg. Vinie Maxcy MD Electronically signed by Vinie Maxcy MD Signature Date/Time: 12/25/2023/2:40:21 PM    Final (Updated)    ECHOCARDIOGRAM LIMITED Result Date: 12/25/2023    ECHOCARDIOGRAM LIMITED REPORT   Patient Name:   [name] Date of Exam: 12/25/2023 Medical Rec #:  [id number]      Height:       66.5 in Accession #:  [id number]     Weight:       150.0 lb Date of Birth:  1940/07/11      BSA:          1.780 m Patient Age:    83 years       BP:           70/42 mmHg Patient Gender: F              HR:           102 bpm. Exam Location:  Inpatient Procedure: Limited Echo (Both Spectral and Color Flow Doppler were utilized            during procedure). Indications:    Pericardiocentisis  History:        Patient has prior history of Echocardiogram examinations, most                 recent 12/25/2023.  Sonographer:    Jayson Gaskins Referring Phys: [id number] [name] THUKKANI IMPRESSIONS  1. Limited echo to support pericardiocentesis. Left ventricular ejection fraction, by estimation, is 60 to 65%. The left ventricle has normal function.  2. Reported drainage of 350 cc  fluid. Noted to be no pericardial effusion after drainage. Moderate pericardial effusion. The pericardial effusion is circumferential.  3. Color doppler was not performed. The mitral valve has been repaired/replaced. A mitra-clip device is noted on the mitral valve leaflets. Comparison(s): No significant change from prior study. 12/25/2023 (TEE): LVEF 60-65%, severe MR. FINDINGS  Left Ventricle: Limited echo to support pericardiocentesis. Left ventricular ejection fraction, by estimation, is 60 to 65%. The left ventricle has normal function. Pericardium: Reported drainage of 350 cc fluid. Noted to be no pericardial effusion after drainage. A moderately sized pericardial effusion is present. The pericardial effusion is circumferential. Mitral Valve: Color doppler was not performed. The mitral valve has been repaired/replaced. There is a Mitra-Clip present in the mitral position. Vinie Maxcy MD Electronically signed by Vinie Maxcy MD Signature Date/Time: 12/25/2023/2:35:27 PM    Final    DG CHEST PORT 1 VIEW Result Date: 12/25/2023 CLINICAL DATA:  Central line placement. EXAM: PORTABLE CHEST 1 VIEW COMPARISON:  Chest radiograph dated 12/25/2023 FINDINGS: Right IJ central venous catheter with tip over the right atrium. No pneumothorax. Cardiomegaly with vascular congestion. Bibasilar atelectasis or infiltrate, right greater than left. A small left pleural effusion noted. Atherosclerotic calcification of the aorta. No acute osseous pathology. IMPRESSION: 1. Right IJ central venous catheter with tip over the right atrium. No pneumothorax. 2. Cardiomegaly with vascular congestion. 3. Bibasilar atelectasis or infiltrate, right greater than left. Electronically Signed   By: Vanetta Chou M.D.   On: 12/25/2023 13:51   ECHO TEE Result Date: 12/25/2023    TRANSESOPHOGEAL ECHO REPORT   Patient Name:   [name] Date of Exam: 12/25/2023 Medical Rec #:  [id number]      Height:       66.5 in Accession #:     [id number]     Weight:       150.0 lb Date of Birth:  [birth year]      BSA:          1.780 m Patient Age:    83 years       BP:           144/57 mmHg Patient Gender: F              HR:  67 bpm. Exam Location:  Inpatient Procedure: Transesophageal Echo, 3D Echo, Color Doppler and Cardiac Doppler            (Both Spectral and Color Flow Doppler were utilized during            procedure). Indications:     Mitral Regurgitation i34.0  History:         Patient has prior history of Echocardiogram examinations, most                  recent 11/22/2023. CAD; Risk Factors:Hypertension.  Sonographer:     Damien Senior RDCS Referring Phys:  [id number] [name] THUKKANI Diagnosing Phys: Maude Emmer MD PROCEDURE: After discussion of the risks and benefits of a TEE, an informed consent was obtained from the patient. The transesophogeal probe was passed without difficulty through the esophogus of the patient. Sedation performed by different physician. The patient developed no complications during the procedure. Patient under general anesthesia for clip implantation.  IMPRESSIONS  1. Left ventricular ejection fraction, by estimation, is 60 to 65%. The left ventricle has normal function.  2. Right ventricular systolic function is normal. The right ventricular size is normal.  3. Left atrial size was severely dilated. No left atrial/left atrial appendage thrombus was detected.  4. Right atrial size was moderately dilated.  5. Extensive 3D imaging of MV done to guide mTEER. Pirior to intervention P3 flail with severe MR. Total reversal of upper PVls , ERO 0.88 cm2 and RV 135 cc Post mTEER Two successfully placed ACE devices Good 3D bridge. Trivial MR seen between the two clips. Mean gradient 7 peak 11 mmHg at HR of 66 bpm. . 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  6. Prolapse of septal and posterior leaflets by 3D imaging. The tricuspid valve is abnormal. 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   7. The aortic valve is tricuspid. There is mild calcification of the aortic valve. Aortic valve regurgitation is mild. Aortic valve sclerosis is present, with no evidence of aortic valve stenosis.  8. PFO present prior to trans septal puncture. Post trans septal puncture only left to right shunting.  9. 3D performed of the mitral valve and 3D performed of the tricuspid valve and demonstrates Used to guide mTEER procedure and image TV. 10. Patient under general anesthesia for clip implantation. FINDINGS  Left Ventricle: Left ventricular ejection fraction, by estimation, is 60 to 65%. The left ventricle has normal function. The left ventricular internal cavity size was normal in size. Right Ventricle: The right ventricular size is normal. Right vetricular wall thickness was not assessed. Right ventricular systolic function is normal. Left Atrium: Left atrial size was severely dilated. No left atrial/left atrial appendage thrombus was detected. Right Atrium: Right atrial size was moderately dilated. Pericardium: There is no evidence of pericardial effusion. Mitral Valve: Extensive 3D imaging of MV done to guide mTEER. Pirior to intervention P3 flail with severe MR. Total reversal of upper PVls , ERO 0.88 cm2 and RV 135 cc Post mTEER Two successfully placed ACE devices Good 3D bridge. Trivial MR seen between  the two clips. Mean gradient 7 peak 11 mmHg at HR of 66 bpm. 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 Tricuspid Valve: Prolapse of septal and posterior leaflets by 3D imaging. The tricuspid valve is abnormal. 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 Aortic Valve: The aortic valve is tricuspid. There is mild calcification of the aortic valve. Aortic valve regurgitation is mild.  Aortic valve sclerosis is present, with no evidence of aortic valve stenosis. Pulmonic Valve: The pulmonic valve was normal in structure. Pulmonic valve regurgitation is mild. Aorta: The aortic root is  normal in size and structure. IAS/Shunts: PFO present prior to trans septal puncture. Post trans septal puncture only left to right shunting. Additional Comments: 3D was performed not requiring image post processing on an independent workstation and was abnormal. Spectral Doppler performed. Maude Emmer MD Electronically signed by Maude Emmer MD Signature Date/Time: 12/25/2023/10:49:23 AM    Final    EP STUDY Result Date: 12/25/2023 See surgical note for result.  Structural Heart Procedure Result Date: 12/25/2023 See surgical note for result.  DG Chest 2 View Result Date: 12/25/2023 CLINICAL DATA:  Preop for mitral valve repair.  Dyspnea on exertion. EXAM: CHEST - 2 VIEW COMPARISON:  February 08, 2023. FINDINGS: The heart size and mediastinal contours are within normal limits. Minimal right basilar scarring is noted. No definite acute pulmonary abnormality is noted. Old right rib fractures are again noted. IMPRESSION: No active cardiopulmonary disease. Electronically Signed   By: Lynwood Landy Raddle M.D.   On: 12/25/2023 08:30     I have independently reviewed the above radiologic studies and discussed with the patient   Recent Lab Findings: Lab Results  Component Value Date   WBC 13.7 (H) 12/26/2023   HGB 11.0 (L) 12/26/2023   HCT 35.3 (L) 12/26/2023   PLT 275 12/26/2023   GLUCOSE 274 (H) 12/26/2023   ALT 38 12/25/2023   AST 62 (H) 12/25/2023   NA 136 12/26/2023   Rivera 5.8 (H) 12/26/2023   CL 103 12/26/2023   CREATININE 2.14 (H) 12/26/2023   BUN 39 (H) 12/26/2023   CO2 15 (L) 12/26/2023   INR 1.0 12/20/2023   Assessment / Plan:      S/P Mitra Clip procedure by Dr Verba and Dr. Wonda.. complicated by hypotension post operatively requiring Pericardiocentesis with removal of 350 cc of fluid.  Tx to ICU post procedure requiring pressor support.  This morning patient with EKG changes, chest pain, N/V.  Echocardiogram obtained showed good placement of MV Clip, new RV dysfunction, severe TR,  but no pericardial effusion was present.  She was taken to catheterization lab and found to have evidence of cardiac tamponade which appears to be clot.  Pericardial Drain remains in place, without significant output.  CT of chest will be obtained to further evaluate if tamponade is truly present.  Request is for pericardial window.   Dr. Shyrl is aware of patient.... he will follow up with recommendations once CT scan is complete.    Erin Barrett, PA-C 12/26/2023 11:59 AM      CT reviewed No evidence of pericardial effusion.  Pericardial drain is posterior Will cancel plans for pericardial window  Wilbur [place] O Lalia L[place]on

## 2023-12-26 NOTE — Progress Notes (Addendum)
 HEART AND VASCULAR CENTER   MULTIDISCIPLINARY HEART VALVE TEAM  Patient Name: [name] Date of Encounter: 12/26/2023  Admit date: 12/25/2023   PCP:  Aisha Harvey, MD  [facility] Cardiologist:  Lurena MARLA Red, MD  [facility] Structural heart: Lurena MARLA Red, MD [facility] Electrophysiologist:  None   Hospital Problem List     Principal Problem:   S/P mitral valve clip implantation Active Problems:   Hyperlipidemia   S/P partial lobectomy of lung   Non-small cell cancer of right lung (HCC)   CAD (coronary artery disease)   Hypertension   Severe mitral regurgitation     Subjective   Feeling terrible with nausea/vomting and chest/neck pain.   Inpatient Medications    Scheduled Meds:  aspirin  EC  81 mg Oral Daily   [START ON 12/27/2023] atorvastatin   10 mg Oral Once per day on Monday Wednesday Friday   Chlorhexidine  Gluconate Cloth  6 each Topical Daily   cholecalciferol   1,000 Units Oral Daily   clopidogrel   75 mg Oral Daily   colchicine   0.6 mg Oral Daily   escitalopram   5 mg Oral Daily   melatonin  1.5 mg Oral QHS   sodium chloride  flush  3 mL Intravenous Q12H   Continuous Infusions:  sodium chloride      norepinephrine  (LEVOPHED ) Adult infusion 7 mcg/min (12/26/23 0800)   PRN Meds: sodium chloride , acetaminophen , ALPRAZolam , azelastine , famotidine , HYDROmorphone  (DILAUDID ) injection, methocarbamol , nitroGLYCERIN , ondansetron  (ZOFRAN ) IV, sodium chloride  flush   Vital Signs    Vitals:   12/26/23 0400 12/26/23 0500 12/26/23 0600 12/26/23 0700  BP: (!) 96/55     Pulse: 76 72 69 69  Resp: 16 20 12 20   Temp:      TempSrc:      SpO2: 93% 92% 93% 93%  Weight:      Height:        Intake/Output Summary (Last 24 hours) at 12/26/2023 0847 Last data filed at 12/26/2023 0800 Gross per 24 hour  Intake 3989.48 ml  Output 330 ml  Net 3659.48 ml   Filed Weights   12/25/23 0711 12/26/23 0300  Weight: 68 kg 72.2 kg    Physical Exam    GEN:  thin white female, appears mildly uncomfortable.  HEENT: Grossly normal.  Neck: Supple, no JVD or masses. Cardiac: RRR, no murmurs, rubs, or gallops. No clubbing, cyanosis, edema.  Pericardial drain in place.  Respiratory:  Respirations regular and unlabored, clear to auscultation bilaterally. GI: Soft, nontender, nondistended, BS + x 4. MS: no deformity or atrophy. Skin: warm and dry, no rash.  Groin site without hematoma or ecchymosis  Neuro:  Strength and sensation are intact. Psych: AAOx3.  Normal affect.  Labs    CBC Recent Labs    12/26/23 0700 12/26/23 0752  WBC 13.3* 13.7*  HGB 10.9* 11.0*  HCT 35.5* 35.3*  MCV 98.6 99.7  PLT 300 275   Basic Metabolic Panel: Recent Labs  Lab 12/20/23 1343 12/25/23 1359 12/26/23 0313 12/26/23 0752  NA 140 139 136 136  K 4.5 3.6 5.2* 5.8*  CL 100 111 105 103  CO2 26 18* 16* 15*  GLUCOSE 83 198* 266* 274*  BUN 29* 30* 37* 39*  CREATININE 1.06* 1.13* 1.62* 2.14*  CALCIUM  9.3 7.3* 7.2* 7.0*   GFR: Estimated Creatinine Clearance: 19.4 mL/min (A) (by C-G formula based on SCr of 2.14 mg/dL (H)). Recent Labs  Lab 12/25/23 1242 12/25/23 1359 12/25/23 1644 12/25/23 1849 12/25/23 1921 12/26/23 0313 12/26/23  0700 12/26/23 0752  WBC  --    < >  --   --  14.2* 14.6* 13.3* 13.7*  LATICACIDVEN 1.2  --  1.0 1.5  --   --   --   --    < > = values in this interval not displayed.   Liver Function Tests Recent Labs    12/25/23 1359  AST 62*  ALT 38  ALKPHOS 34*  BILITOT 1.0  PROT 5.7*  ALBUMIN  3.2*    Telemetry    Sinus with occasional PVCs - Personally Reviewed  ECG    Sinus with diffuse ST elevation PR depression, HR 69   - Personally Reviewed  Patient Profile     [name] is a 83 y.o. female with a history of coronary artery disease s/p DES x 3 to the ostial to distal RCA (11/22/23), emphysema, lung cancer s/p partial lung resection, polymyalgia rheumatica, HLD, carotid artery disease, HFpEF and severe mitral  valve regurgitation who presented to [facility] on 12/25/23 for planned mTEER.  Assessment & Plan    Severe MR:  -- S/p mTEER with with PASCAL ACE x 2  placed at A3/P3 by Dr Wendel on 12/25/23.  -- Echo today.  -- Groin site stable.  -- Continue Asprin 81mg  daily and Plavix  75mg  daily with recent PCI.    Pericardial effusion: -- S/p pericardiocentesis 12/25/23 with evacuation of 400 cc of bloody fluid. -- Drain in place with < 30 CC output.  -- Will review echo with MDs and see if drain can be removed.    Acute pericarditis:  -- Pleuritic chest pain and ECG with diffuse ST elevation and some PR depression. -- Likely 2/2 pericardial bleed with irritation and drain placement. -- Started on colchicine  0.6 mg BID but decreased to 0.6 daily given rise in creat. Having significant nausea. -- Will consider adding high dose aspirin .  -- Hopefully will improve with removal of drain.   Hypotension: -- Requiring 8 of NE.  AKI  -- SCr 1.13>1.62>2.14 -- Baseline SCr ~.9 -- Avoid hypotension -- May need diuresis, CVP 14/15 but appears euvolemic on exam. Weight up 9lbs from yesterday and treated with IVFs  Hyperkalemia:  -- K 5.8. -- Will discuss if she needs diuresis with elevated CVP.   CAD: -- S/p DES x 3 to the ostial to distal RCA (11/22/23).  -- Continue Asprin 81mg  daily and Plavix  75mg  daily.  -- Continue atorvastatin  10mg  daily.     Signed, Lamarr Hummer, PA-C  12/26/2023, 8:47 AM  Pager [phone]   ATTENDING ATTESTATION:  After conducting a review of all available clinical information with the care team, interviewing the patient, and performing a physical exam, I agree with the findings and plan described in this note.   GEN: No acute distress, AO x 3 HEENT:  MMM, no JVD, no scleral icterus Cardiac: RRR, no murmurs, rubs, or gallops.  Respiratory: Clear to auscultation bilaterally. GI: Soft, nontender, non-distended  MS: No edema; No deformity. Neuro:  Nonfocal  Vasc:  +2  radial pulses  Patient's status post mitral transcatheter edge-to-edge repair with Pascal Ace x 2 complicated by cardiac tamponade requiring pericardial drain placement.  This morning there has been no significant drainage from her pericardial drain.  The patient's EKG demonstrates diffuse ST elevations consistent with pericarditis.  She is nauseated and her chest pain is clearly pleuritic in nature.  Due to the development of cardiogenic shock she has developed acute kidney injury likely due to ATN.  I think this will plateau and should decrease back to baseline.  Continue to monitor for now.   Discussed with Dr. Zenaida.  Will follow-up echocardiogram today for effusion and RV function.  If RV function is abnormal then we may need to pursue coronary angiography.   Continue colchicine  but decreased to 0.6 daily due to AKI.  Continue Plavix  75, aspirin  81 for now.  Will consider increasing to high dose aspirin  if drain is removed.  Lurena Red, MD Pager [phone]

## 2023-12-26 NOTE — Progress Notes (Signed)
 Conscious sedation for TEE Pre-oxygenated with NRB. During procedure difficulty passing the scope, was not able to keep a mask on her easily with desaturation. Decision made to intubate to make the procedure more feasibly and safe. Saturations quickly recovered with BVM before intubation. 7.0 tube.   Leita SHAUNNA Gaskins, DO 12/26/23 2:52 PM [facility]  For contact information, see Amion. If no response to pager, please call PCCM consult pager. After hours, 7PM- 7AM, please call Elink.

## 2023-12-26 NOTE — Procedures (Signed)
 Central Venous Catheter Insertion Procedure Note [name] [id number] May 19, 1940    Procedure: Insertion of Central Venous Catheter Indications: Drug and/or fluid administration   Procedure Details Consent: Risks of procedure as well as the alternatives and risks of each were explained to the (patient/caregiver).  Consent for procedure obtained. Time Out: Verified patient identification, verified procedure, site/side was marked, verified correct patient position, special equipment/implants available, medications/allergies/relevent history reviewed, required imaging and test results available.  Performed   Maximum sterile technique was used including antiseptics, cap, gloves, gown, hand hygiene, mask and sheet. Skin prep: Chlorhexidine ; local anesthetic administered A antimicrobial bonded/coated triple lumen catheter was placed in the right internal jugular vein using the Seldinger technique and u/s guidance.   Evaluation Blood flow good Complications: No apparent complications Patient did tolerate procedure well. Chest X-ray ordered to verify placement.  CXR: with good placement, no pneumothorax   Melinda [name], MD  9:28 PM  [name] Advanced Heart Failure Mechanical Circulatory Support

## 2023-12-27 ENCOUNTER — Encounter (HOSPITAL_COMMUNITY): Payer: Self-pay | Admitting: Cardiology

## 2023-12-27 ENCOUNTER — Other Ambulatory Visit (HOSPITAL_COMMUNITY)

## 2023-12-27 DIAGNOSIS — R570 Cardiogenic shock: Secondary | ICD-10-CM | POA: Diagnosis not present

## 2023-12-27 DIAGNOSIS — I3139 Other pericardial effusion (noninflammatory): Secondary | ICD-10-CM | POA: Diagnosis not present

## 2023-12-27 DIAGNOSIS — Z9889 Other specified postprocedural states: Secondary | ICD-10-CM | POA: Diagnosis not present

## 2023-12-27 DIAGNOSIS — I309 Acute pericarditis, unspecified: Secondary | ICD-10-CM | POA: Diagnosis not present

## 2023-12-27 DIAGNOSIS — J9690 Respiratory failure, unspecified, unspecified whether with hypoxia or hypercapnia: Secondary | ICD-10-CM

## 2023-12-27 DIAGNOSIS — Z95818 Presence of other cardiac implants and grafts: Secondary | ICD-10-CM | POA: Diagnosis not present

## 2023-12-27 DIAGNOSIS — D62 Acute posthemorrhagic anemia: Secondary | ICD-10-CM

## 2023-12-27 DIAGNOSIS — E875 Hyperkalemia: Secondary | ICD-10-CM | POA: Diagnosis not present

## 2023-12-27 LAB — COOXEMETRY PANEL
Carboxyhemoglobin: 1.1 % (ref 0.5–1.5)
Carboxyhemoglobin: 1.3 % (ref 0.5–1.5)
Methemoglobin: <0.7 % (ref 0.0–1.5)
Methemoglobin: <0.7 % (ref 0.0–1.5)
O2 Saturation: 70 %
O2 Saturation: 58.8 %
Total hemoglobin: 10.1 g/dL — ABNORMAL LOW (ref 12.0–16.0)
Total hemoglobin: 9.2 g/dL — ABNORMAL LOW (ref 12.0–16.0)

## 2023-12-27 LAB — LACTIC ACID, PLASMA
Lactic Acid, Venous: 3.5 mmol/L (ref 0.5–1.9)
Lactic Acid, Venous: 3 mmol/L (ref 0.5–1.9)
Lactic Acid, Venous: 2.6 mmol/L (ref 0.5–1.9)

## 2023-12-27 LAB — CG4 I-STAT (LACTIC ACID)
Lactic Acid, Venous: 3 mmol/L (ref 0.5–1.9)
Lactic Acid, Venous: 2.7 mmol/L (ref 0.5–1.9)
Lactic Acid, Venous: 2.8 mmol/L (ref 0.5–1.9)

## 2023-12-27 LAB — CBC
HCT: 30.1 % — ABNORMAL LOW (ref 36.0–46.0)
Hemoglobin: 9.8 g/dL — ABNORMAL LOW (ref 12.0–15.0)
MCH: 30.7 pg (ref 26.0–34.0)
MCHC: 32.6 g/dL (ref 30.0–36.0)
MCV: 94.4 fL (ref 80.0–100.0)
Platelets: 158 K/uL (ref 150–400)
RBC: 3.19 MIL/uL — ABNORMAL LOW (ref 3.87–5.11)
RDW: 13.3 % (ref 11.5–15.5)
WBC: 9.5 K/uL (ref 4.0–10.5)
nRBC: 0.3 % — ABNORMAL HIGH (ref 0.0–0.2)

## 2023-12-27 LAB — BASIC METABOLIC PANEL WITH GFR
Anion gap: 15 (ref 5–15)
Anion gap: 16 — ABNORMAL HIGH (ref 5–15)
BUN: 54 mg/dL — ABNORMAL HIGH (ref 8–23)
BUN: 61 mg/dL — ABNORMAL HIGH (ref 8–23)
CO2: 19 mmol/L — ABNORMAL LOW (ref 22–32)
CO2: 18 mmol/L — ABNORMAL LOW (ref 22–32)
Calcium: 7.2 mg/dL — ABNORMAL LOW (ref 8.9–10.3)
Calcium: 7.1 mg/dL — ABNORMAL LOW (ref 8.9–10.3)
Chloride: 104 mmol/L (ref 98–111)
Chloride: 102 mmol/L (ref 98–111)
Creatinine, Ser: 3.06 mg/dL — ABNORMAL HIGH (ref 0.44–1.00)
Creatinine, Ser: 3.55 mg/dL — ABNORMAL HIGH (ref 0.44–1.00)
GFR, Estimated: 15 mL/min — ABNORMAL LOW (ref >60)
GFR, Estimated: 12 mL/min — ABNORMAL LOW (ref >60)
Glucose, Bld: 220 mg/dL — ABNORMAL HIGH (ref 70–99)
Glucose, Bld: 148 mg/dL — ABNORMAL HIGH (ref 70–99)
Potassium: 4.2 mmol/L (ref 3.5–5.1)
Potassium: 4.4 mmol/L (ref 3.5–5.1)
Sodium: 138 mmol/L (ref 135–145)
Sodium: 136 mmol/L (ref 135–145)

## 2023-12-27 LAB — POCT I-STAT 7, (LYTES, BLD GAS, ICA,H+H)
Acid-base deficit: 4 mmol/L — ABNORMAL HIGH (ref 0.0–2.0)
Bicarbonate: 21.7 mmol/L (ref 20.0–28.0)
Calcium, Ion: 1.03 mmol/L — ABNORMAL LOW (ref 1.15–1.40)
HCT: 29 % — ABNORMAL LOW (ref 36.0–46.0)
Hemoglobin: 9.9 g/dL — ABNORMAL LOW (ref 12.0–15.0)
O2 Saturation: 99 %
Patient temperature: 97.5
Potassium: 4.3 mmol/L (ref 3.5–5.1)
Sodium: 139 mmol/L (ref 135–145)
TCO2: 23 mmol/L (ref 22–32)
pCO2 arterial: 40.3 mmHg (ref 32–48)
pH, Arterial: 7.336 — ABNORMAL LOW (ref 7.35–7.45)
pO2, Arterial: 169 mmHg — ABNORMAL HIGH (ref 83–108)

## 2023-12-27 LAB — GLUCOSE, CAPILLARY
Glucose-Capillary: 117 mg/dL — ABNORMAL HIGH (ref 70–99)
Glucose-Capillary: 125 mg/dL — ABNORMAL HIGH (ref 70–99)
Glucose-Capillary: 128 mg/dL — ABNORMAL HIGH (ref 70–99)
Glucose-Capillary: 133 mg/dL — ABNORMAL HIGH (ref 70–99)
Glucose-Capillary: 144 mg/dL — ABNORMAL HIGH (ref 70–99)
Glucose-Capillary: 147 mg/dL — ABNORMAL HIGH (ref 70–99)
Glucose-Capillary: 151 mg/dL — ABNORMAL HIGH (ref 70–99)
Glucose-Capillary: 163 mg/dL — ABNORMAL HIGH (ref 70–99)
Glucose-Capillary: 205 mg/dL — ABNORMAL HIGH (ref 70–99)
Glucose-Capillary: 215 mg/dL — ABNORMAL HIGH (ref 70–99)
Glucose-Capillary: 222 mg/dL — ABNORMAL HIGH (ref 70–99)
Glucose-Capillary: 233 mg/dL — ABNORMAL HIGH (ref 70–99)
Glucose-Capillary: 246 mg/dL — ABNORMAL HIGH (ref 70–99)
Glucose-Capillary: 256 mg/dL — ABNORMAL HIGH (ref 70–99)
Glucose-Capillary: 258 mg/dL — ABNORMAL HIGH (ref 70–99)
Glucose-Capillary: 265 mg/dL — ABNORMAL HIGH (ref 70–99)

## 2023-12-27 MED ORDER — VITAMIN D 25 MCG (1000 UNIT) PO TABS: 1000.0000 [IU] | ORAL_TABLET | Freq: Every day | ORAL | Status: DC

## 2023-12-27 MED ORDER — ESCITALOPRAM OXALATE 10 MG PO TABS: 5.0000 mg | ORAL_TABLET | Freq: Every day | ORAL | Status: DC

## 2023-12-27 MED ORDER — ASPIRIN 325 MG PO TBEC
325.0000 mg | DELAYED_RELEASE_TABLET | Freq: Every day | ORAL | Status: DC
  Administered 2023-12-27: 325 mg via ORAL
  Filled 2023-12-27: qty 1

## 2023-12-27 MED ORDER — ORAL CARE MOUTH RINSE: 15.0000 mL | OROMUCOSAL | Status: DC | PRN

## 2023-12-27 MED ORDER — INSULIN ASPART 100 UNIT/ML IJ SOLN
1.0000 [IU] | INTRAMUSCULAR | Status: DC
  Administered 2023-12-27 – 2023-12-28 (×4): 1 [IU] via SUBCUTANEOUS

## 2023-12-27 MED ORDER — INSULIN REGULAR(HUMAN) IN NACL 100-0.9 UT/100ML-% IV SOLN
INTRAVENOUS | Status: DC
  Administered 2023-12-27: 2.2 [IU]/h via INTRAVENOUS
  Filled 2023-12-27: qty 100

## 2023-12-27 MED ORDER — DEXTROSE 50 % IV SOLN: 0.0000 mL | INTRAVENOUS | Status: DC | PRN

## 2023-12-27 MED ORDER — CLOPIDOGREL BISULFATE 75 MG PO TABS
75.0000 mg | ORAL_TABLET | Freq: Every day | ORAL | Status: DC
  Filled 2023-12-27: qty 1

## 2023-12-27 MED ORDER — VITAMIN D 25 MCG (1000 UNIT) PO TABS
1000.0000 [IU] | ORAL_TABLET | Freq: Every day | ORAL | Status: DC
  Filled 2023-12-27: qty 1

## 2023-12-27 MED ORDER — SENNOSIDES-DOCUSATE SODIUM 8.6-50 MG PO TABS
2.0000 | ORAL_TABLET | Freq: Every day | ORAL | Status: DC
  Administered 2023-12-28: 2 via ORAL
  Filled 2023-12-27: qty 2

## 2023-12-27 MED ORDER — MELATONIN 3 MG PO TABS: 1.5000 mg | ORAL_TABLET | Freq: Every day | ORAL | Status: DC

## 2023-12-27 MED ORDER — FUROSEMIDE 10 MG/ML IJ SOLN
80.0000 mg | Freq: Once | INTRAMUSCULAR | Status: AC
  Administered 2023-12-27: 80 mg via INTRAVENOUS
  Filled 2023-12-27: qty 8

## 2023-12-27 MED ORDER — DOCUSATE SODIUM 100 MG PO CAPS: 100.0000 mg | ORAL_CAPSULE | Freq: Two times a day (BID) | ORAL | Status: DC

## 2023-12-27 MED ORDER — CLOPIDOGREL BISULFATE 75 MG PO TABS: 75.0000 mg | ORAL_TABLET | Freq: Every day | ORAL | Status: DC

## 2023-12-27 MED ORDER — POLYETHYLENE GLYCOL 3350 17 G PO PACK
17.0000 g | PACK | Freq: Every day | ORAL | Status: DC
  Filled 2023-12-27: qty 1

## 2023-12-27 MED ORDER — ASPIRIN 325 MG PO TBEC
650.0000 mg | DELAYED_RELEASE_TABLET | Freq: Three times a day (TID) | ORAL | Status: DC
  Administered 2023-12-27 (×2): 650 mg via ORAL
  Filled 2023-12-27 (×4): qty 2

## 2023-12-27 MED ORDER — ESCITALOPRAM OXALATE 10 MG PO TABS
5.0000 mg | ORAL_TABLET | Freq: Every day | ORAL | Status: DC
  Filled 2023-12-27: qty 1

## 2023-12-27 MED ORDER — PANTOPRAZOLE SODIUM 40 MG PO TBEC
40.0000 mg | DELAYED_RELEASE_TABLET | Freq: Two times a day (BID) | ORAL | Status: DC
  Administered 2023-12-27: 40 mg via ORAL
  Filled 2023-12-27 (×3): qty 1

## 2023-12-27 MED ORDER — METHOCARBAMOL 500 MG PO TABS
500.0000 mg | ORAL_TABLET | Freq: Three times a day (TID) | ORAL | Status: DC | PRN
  Administered 2023-12-27 – 2023-12-28 (×2): 500 mg via ORAL
  Filled 2023-12-27 (×3): qty 1

## 2023-12-27 MED ORDER — FUROSEMIDE 10 MG/ML IJ SOLN
160.0000 mg | Freq: Once | INTRAMUSCULAR | Status: AC
  Administered 2023-12-27: 160 mg via INTRAVENOUS
  Filled 2023-12-27: qty 10

## 2023-12-27 MED ORDER — MELATONIN 3 MG PO TABS
1.5000 mg | ORAL_TABLET | Freq: Every day | ORAL | Status: DC
  Administered 2023-12-27 – 2023-12-28 (×2): 1.5 mg via ORAL
  Filled 2023-12-27 (×2): qty 1

## 2023-12-27 NOTE — Procedures (Signed)
 Extubation Procedure Note  Patient Details:   Name: [name] DOB: May 17, 1940 MRN: [id number]   Airway Documentation:    Vent end date: 12/27/23 Vent end time: 0942   Evaluation  O2 sats: stable throughout Complications: No apparent complications Patient did tolerate procedure well. Bilateral Breath Sounds: Clear, Diminished   Yes Pt was successfully extubated with no apparent complications. Audible cuffleak was heard prior extubation and Vte loss noted, no signs of stridor at this time. Pt is able to speak and protect airway at this time. Pt placed on 4L [place] and is tolerating well.   Germain JAYSON Mater 12/27/2023, 9:42 AM

## 2023-12-27 NOTE — Progress Notes (Addendum)
 HEART AND VASCULAR CENTER   MULTIDISCIPLINARY HEART VALVE TEAM  Patient Name: [name] Date of Encounter: 12/27/2023  Admit date: 12/25/2023   PCP:  Melinda Harvey, MD  [facility] Cardiologist:  Melinda MARLA Red, MD  [facility] Structural heart: Melinda MARLA Red, MD [facility] Electrophysiologist:  None   Hospital Problem List     Principal Problem:   S/P mitral valve clip implantation Active Problems:   Hyperlipidemia   S/P partial lobectomy of lung   Non-small cell cancer of right lung (HCC)   CAD (coronary artery disease)   Hypertension   Severe mitral regurgitation   Cardiogenic shock (HCC)     Subjective   Awake. Alert and oriented. Having some throat pain and thirsty.   Inpatient Medications    Scheduled Meds:  arformoterol   15 mcg Nebulization BID   aspirin  EC  325 mg Oral Daily   atorvastatin   10 mg Oral Once per day on Monday Wednesday Friday   Chlorhexidine  Gluconate Cloth  6 each Topical Daily   [START ON 12/28/2023] cholecalciferol   1,000 Units Per Tube Daily   [START ON 12/28/2023] clopidogrel   75 mg Per Tube Daily   colchicine   0.6 mg Oral Daily   docusate  100 mg Per Tube BID   [START ON 12/28/2023] escitalopram   5 mg Per Tube Daily   melatonin  1.5 mg Per Tube QHS   mouth rinse  15 mL Mouth Rinse Q2H   pantoprazole  (PROTONIX ) IV  40 mg Intravenous Daily   polyethylene glycol  17 g Per Tube Daily   revefenacin   175 mcg Nebulization Daily   sodium chloride  flush  3 mL Intravenous Q12H   Continuous Infusions:  sodium chloride      ampicillin -sulbactam (UNASYN ) IV Stopped (12/27/23 0446)   DOBUTamine  5 mcg/kg/min (12/27/23 1000)   insulin  1 Units/hr (12/27/23 1000)   norepinephrine  (LEVOPHED ) Adult infusion 7 mcg/min (12/27/23 1000)   vasopressin  0.03 Units/min (12/27/23 1000)   PRN Meds: sodium chloride , acetaminophen , ALPRAZolam , azelastine , dextrose , famotidine , HYDROmorphone  (DILAUDID ) injection, nitroGLYCERIN , mouth rinse,  prochlorperazine , sodium chloride  flush   Vital Signs    Vitals:   12/27/23 1003 12/27/23 1004 12/27/23 1005 12/27/23 1006  BP:      Pulse: 88 87 87 87  Resp: 17 15 16 14   Temp:      TempSrc:      SpO2: 96% 95% 96% 96%  Weight:      Height:        Intake/Output Summary (Last 24 hours) at 12/27/2023 1026 Last data filed at 12/27/2023 1000 Gross per 24 hour  Intake 1208.05 ml  Output 360 ml  Net 848.05 ml   Filed Weights   12/25/23 0711 12/26/23 0300 12/27/23 0500  Weight: 68 kg 72.2 kg 74 kg    Physical Exam    GEN: elderly white female. HEENT: Grossly normal.  Neck: JVD ~10 cm. RIJ CVC  Cardiac: RRR, no murmurs, rubs, or gallops. No clubbing, cyanosis. Mild R arm edema.  Pericardial drain in place.  Respiratory:  Respirations regular and unlabored, clear to auscultation bilaterally. GI: Soft, nontender, nondistended, BS + x 4. MS: no deformity or atrophy. Skin: warm and dry, no rash.  Groin site without hematoma or ecchymosis  Neuro:  Strength and sensation are intact. Psych: AAOx3.  Normal affect.  Labs    CBC Recent Labs    12/26/23 1317 12/26/23 1554 12/27/23 0448 12/27/23 0453  WBC 15.3*  --   --  9.5  HGB  10.4*   < > 9.9* 9.8*  HCT 32.9*   < > 29.0* 30.1*  MCV 98.8  --   --  94.4  PLT 241  --   --  158   < > = values in this interval not displayed.   Basic Metabolic Panel: Recent Labs  Lab 12/25/23 1359 12/26/23 0313 12/26/23 0752 12/26/23 1103 12/26/23 1317 12/26/23 1554 12/27/23 0448 12/27/23 0453  NA 139 136 136 138  138 136 145 139 138  K 3.6 5.2* 5.8* 4.9  4.9 4.5 4.0 4.3 4.2  CL 111 105 103  --  103  --   --  104  CO2 18* 16* 15*  --  19*  --   --  19*  GLUCOSE 198* 266* 274*  --  247*  --   --  220*  BUN 30* 37* 39*  --  44*  --   --  54*  CREATININE 1.13* 1.62* 2.14*  --  2.51*  --   --  3.06*  CALCIUM  7.3* 7.2* 7.0*  --  7.1*  --   --  7.2*   GFR: Estimated Creatinine Clearance: 14.7 mL/min (A) (by C-G formula based on SCr of  3.06 mg/dL (H)). Recent Labs  Lab 12/26/23 0700 12/26/23 0752 12/26/23 0926 12/26/23 1317 12/26/23 1340 12/26/23 1619 12/26/23 2318 12/27/23 0453 12/27/23 0656 12/27/23 0900  WBC 13.3* 13.7*  --  15.3*  --   --   --  9.5  --   --   LATICACIDVEN  --   --    < >  --    < > 5.3* 5.6*  --  3.0* 2.7*   < > = values in this interval not displayed.   Liver Function Tests Recent Labs    12/25/23 1359  AST 62*  ALT 38  ALKPHOS 34*  BILITOT 1.0  PROT 5.7*  ALBUMIN  3.2*    Telemetry    Sinus with occasional PVCs - Personally Reviewed  ECG    12/26/23: Sinus with diffuse ST elevation PR depression, HR 69   - Personally Reviewed  Patient Profile     [name] is a 83 y.o. female with a history of coronary artery disease s/p DES x 3 to the ostial to distal RCA (11/22/23), emphysema, lung cancer s/p partial lung resection, polymyalgia rheumatica, HLD, carotid artery disease, HFpEF and severe mitral valve regurgitation who presented to [facility] on 12/25/23 for planned mTEER.  Assessment & Plan    Severe MR:  -- S/p mTEER with with PASCAL ACE x 2  placed at A3/P3 by Dr Wendel on 12/25/23.  -- POD1 echo showed well placed clip with mild gradient 8 mmHh, minimal MR, mild RV dysfunction, mod-severe TR, no effusion  -- Groin site stable.  -- Continue Asprin 81mg  daily and Plavix  75mg  daily with recent PCI. Aspirin  increased to 325mg  daily for pericarditis.    Pericardial effusion with cardiac tamponade/pericarditis: -- S/p pericardiocentesis 12/25/23 with evacuation of 400 cc of bloody fluid. -- Drain in place, plan to remove. -- Pleuritic chest pain and ECG with diffuse ST elevation and some PR depression. -- Started on colchicine  0.6 mg BID but decreased to 0.6 daily given rise in creat.  -- Aspirin  increased to 325mg  daily.   RV failure with cardiogenic shock: -- L/RHC 9/11: with equalization of filling pressures and severely reduced cardiac index by Fick, but no evidence of  coronary disease> concern for potential tamponade.  -- Chest CT with no  evidence of swelling or effusion.  -- TEE 9/11: No localized swelling or effusion.  -- Required intubation 2/2 increased secretions and poor oxygenation. Now extubated. -- Requiring multiple pressors with: NE @ 4, DBA @ 5 and vaso @ 0.03.  -- Treated with one dose of IV lasix  80 x1 given CVP of 11 and 13 lb weight gain -- Lactate improving 5.6-->2.7.  AKI  -- SCr SCr 1.13>1.62>2.14>2.51>3.06.  -- Baseline SCr ~0.9.  Hyperkalemia:  -- Resolved.   CAD: -- S/p DES x 3 to the ostial to distal RCA (11/22/23).  -- Continue DAPT. -- Continue atorvastatin  10mg  daily.     Signed, Lamarr Hummer, PA-C  12/27/2023, 10:26 AM  Pager [phone]   ATTENDING ATTESTATION:  After conducting a review of all available clinical information with the care team, interviewing the patient, and performing a physical exam, I agree with the findings and plan described in this note.   GEN: No acute distress, AO x 3 HEENT: Dry mucous membranes, right IJ central line in place Cardiac: RRR, no murmurs, rubs, or gallops.  Respiratory: Diminished breath sounds at bases GI: Soft, nontender, non-distended  MS: No edema; No deformity. Neuro:  Nonfocal  Vasc:  +2 radial pulses  Patient remains on inotropes and multiple pressors.  Unfortunately her creatinine continues to climb.  Her lactate is also rising.  She had a poor response to Lasix  administered earlier today.  I am hopeful that CRRT can be avoided.  On review of all clinical data this seems to be an RV a myopathic process.  I do not think her iatrogenic ASD is contributing in any way to her pathophysiology.  A coronary issue for her RV dysfunction was excluded by coronary angiography yesterday however her right heart cath demonstrated a PA pulsatility index of 0.7 with an LVEDP of 23..  A TEE and CT scan demonstrated no residual pericardial effusion or compressive thrombus.  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  Very much appreciate AHF and CCM care  Melinda Red, MD Pager [phone]

## 2023-12-27 NOTE — TOC Initial Note (Signed)
 Transition of Care [facility]) - Initial/Assessment Note    Patient Details  Name: [name] MRN: [id number] Date of Birth: [birth year]  Transition of Care [facility]) CM/SW Contact:    Justina Delcia Czar, RN Phone Number: [phone] 12/27/2023, 3:37 PM  Clinical Narrative:                  Spoke to pt and daughters at bedside. Pt lives in her home alone. Was independent pta. Educated PT/OT will evaluate and make recommendations. Possible IP rehab vs SNF rehab. Chart reviewed for discharge readiness, patient not medically stable for d/c. Inpatient CM/CSW will continue to monitor pt's advancement through interdisciplinary progression rounds. If new pt transition needs arise, MD please place a TOC consult.      Expected Discharge Plan: IP Rehab Facility Barriers to Discharge: Continued Medical Work up   Patient Goals and CMS Choice Patient states their goals for this hospitalization and ongoing recovery are:: wants to recover     Expected Discharge Plan and Services   Discharge Planning Services: CM Consult   Living arrangements for the past 2 months: Single Family Home                                      Prior Living Arrangements/Services Living arrangements for the past 2 months: Single Family Home Lives with:: Self Patient language and need for interpreter reviewed:: Yes Do you feel safe going back to the place where you live?: Yes      Need for Family Participation in Patient Care: Yes (Comment) Care giver support system in place?: Yes (comment) Current home services: DME Criminal Activity/Legal Involvement Pertinent to Current Situation/Hospitalization: No - Comment as needed  Activities of Daily Living   ADL Screening (condition at time of admission) Independently performs ADLs?: Yes (appropriate for developmental age) Is the patient deaf or have difficulty hearing?: No Does the patient have difficulty seeing, even when wearing glasses/contacts?: No Does the  patient have difficulty concentrating, remembering, or making decisions?: No  Permission Sought/Granted Permission sought to share information with : Family Supports, Case Production designer, theatre/television/film, PCP Permission granted to share information with : Yes, Verbal Permission Granted  Share Information with NAME: Nat Netters  Permission granted to share info w AGENCY: Home Health, PCP, DME  Permission granted to share info w Relationship: daughter  Permission granted to share info w Contact Information: [phone]  Emotional Assessment Appearance:: Appears stated age Attitude/Demeanor/Rapport: Engaged Affect (typically observed): Accepting Orientation: : Oriented to Self, Oriented to Place, Oriented to  Time, Oriented to Situation   Psych Involvement: No (comment)  Admission diagnosis:  S/P mitral valve clip implantation [S01.109, Z95.818] Patient Active Problem List   Diagnosis Date Noted   Cardiogenic shock (HCC) 12/27/2023   S/P mitral valve clip implantation 12/25/2023   Hypertension 11/22/2023   Severe mitral regurgitation 11/22/2023   Tricuspid regurgitation 11/22/2023   Lentigo 04/04/2021   Melanocytic nevi of trunk 04/04/2021   Seborrheic keratosis 04/04/2021   Chronic nasal congestion 06/02/2018   Maxillary sinusitis 06/02/2018   Aortic arch atherosclerosis (HCC) 03/13/2016   CAD (coronary artery disease) 03/13/2016   Non-small cell cancer of right lung (HCC) 05/19/2013   S/P partial lobectomy of lung 01/30/2013   Arthritis 01/21/2013   Hyperlipidemia 01/21/2013   Osteoporosis 01/21/2013   Cataract 01/21/2013   Pulmonary nodules 01/20/2013   Constipation 08/16/2008   PCP:  Aisha Harvey, MD  Pharmacy:   [facility] [address] [place] [facility] [address] [place] [address] Phone: [phone] Fax: [phone]  [facility] [place], [place] - [facility] AT Portal to Registered [address] [place] [address] Phone: [phone] Fax: [phone]  [facility] [address] [place] [address] Phone: [phone] Fax: [phone]     Social Drivers of Health (SDOH) Social History: SDOH Screenings   Food Insecurity: No Food Insecurity (12/25/2023)  Housing: Low Risk  (12/25/2023)  Transportation Needs: No Transportation Needs (12/25/2023)  Utilities: Not At Risk (12/25/2023)  Social Connections: Unknown (12/25/2023)  Tobacco Use: Medium Risk (12/25/2023)   SDOH Interventions:     Readmission Risk Interventions     No data to display

## 2023-12-27 NOTE — Progress Notes (Signed)
 Patient with poor response to 80 IV lasix  x1 early this morning. Discussed with Dr. Zenaida. Will try higher dose. Give 160 IV lasix  x1. Follow response. Avoid hypotension.   Beckey LITTIE Coe AGACNP-BC Advanced Heart Failure Team 12/27/23

## 2023-12-27 NOTE — Progress Notes (Signed)
   Called about increasing lactic acid.   LA 2.7 at 9a -> 3.5 at 1436  Remains on multiple inotropes and pressures.   No response to IV lasix . Scr 3.06 -> 3.55 CVP increased 12-> 17  Denies CP or SOB. Nausea resolved  Stat Co-ox 59%   POC lactic acid checked at 1703  LA 2.8  D/w CCM at bedside.   Will continue current treatment. May need CVVHD soon.  CCT 40 mins .  Toribio Fuel, MD  5:08 PM

## 2023-12-27 NOTE — Progress Notes (Signed)
 Advanced Heart Failure Rounding Note  Cardiologist: Lurena MARLA Red, MD  Chief Complaint: S/P mTEER complicated by pericardial effusion Subjective:   -s/p pericardiocentesis 12/25/23. Repeat echo with no effusion - L/RHC 9/11: with equalization of filling pressures and severely reduced cardiac index by Fick, but no evidence of coronary disease> concern for potential tamponade. Chest CT with no evidence of swelling or effusion.  - TEE 9/11: No localized swelling or effusion. Required intubation 2/2 increased secretions and poor oxygenation.   SCr 1.13>1.62>2.14>2.51>3.06.  CVP 11  Now on NE @ 4, DBA @ 5 and vaso @ 0.03.   Intubated and sedated. Following commands.   Objective:   Weight Range: 74 kg Body mass index is 25.94 kg/m.   Vital Signs:   Temp:  [97.5 F (36.4 C)] 97.5 F (36.4 C) (09/12 0340) Pulse Rate:  [63-101] 80 (09/12 0700) Resp:  [13-21] 18 (09/12 0700) BP: (68-161)/(40-101) 119/55 (09/12 0700) SpO2:  [90 %-99 %] 98 % (09/12 0700) Arterial Line BP: (67-220)/(35-87) 124/49 (09/12 0700) FiO2 (%):  [50 %-100 %] 50 % (09/12 0336) Weight:  [74 kg] 74 kg (09/12 0500) Last BM Date :  (PTA)  Weight change: Filed Weights   12/25/23 0711 12/26/23 0300 12/27/23 0500  Weight: 68 kg 72.2 kg 74 kg   Intake/Output:   Intake/Output Summary (Last 24 hours) at 12/27/2023 0713 Last data filed at 12/27/2023 0700 Gross per 24 hour  Intake 1182.49 ml  Output 360 ml  Net 822.49 ml    CVP 11 Physical Exam  General:  elderly appearing.   Neck: JVD ~10 cm. RIJ CVC Cor: Regular rate & rhythm. No murmurs. Lungs: clear Extremities: no edema  Neuro: intubated and sedated. Follows commands.   Telemetry   NSR 70s (Personally reviewed)    Labs    CBC Recent Labs    12/26/23 1317 12/26/23 1554 12/27/23 0448 12/27/23 0453  WBC 15.3*  --   --  9.5  HGB 10.4*   < > 9.9* 9.8*  HCT 32.9*   < > 29.0* 30.1*  MCV 98.8  --   --  94.4  PLT 241  --   --  158   < > =  values in this interval not displayed.   Basic Metabolic Panel Recent Labs    90/88/74 1317 12/26/23 1554 12/27/23 0448 12/27/23 0453  NA 136   < > 139 138  K 4.5   < > 4.3 4.2  CL 103  --   --  104  CO2 19*  --   --  19*  GLUCOSE 247*  --   --  220*  BUN 44*  --   --  54*  CREATININE 2.51*  --   --  3.06*  CALCIUM  7.1*  --   --  7.2*   < > = values in this interval not displayed.   Liver Function Tests Recent Labs    12/25/23 1359  AST 62*  ALT 38  ALKPHOS 34*  BILITOT 1.0  PROT 5.7*  ALBUMIN  3.2*   No results for input(s): LIPASE, AMYLASE in the last 72 hours. Cardiac Enzymes No results for input(s): CKTOTAL, CKMB, CKMBINDEX, TROPONINI in the last 72 hours.  BNP: BNP (last 3 results) No results for input(s): BNP in the last 8760 hours.  ProBNP (last 3 results) Recent Labs    12/20/23 1343  PROBNP 940*   D-Dimer No results for input(s): DDIMER in the last 72 hours. Hemoglobin A1C No results  for input(s): HGBA1C in the last 72 hours. Fasting Lipid Panel No results for input(s): CHOL, HDL, LDLCALC, TRIG, CHOLHDL, LDLDIRECT in the last 72 hours. Thyroid  Function Tests No results for input(s): TSH, T4TOTAL, T3FREE, THYROIDAB in the last 72 hours.  Invalid input(s): FREET3  Other results: Imaging   ECHO TEE Result Date: 12/26/2023 Melinda Morene PARAS, MD     12/26/2023  9:33 PM TRANSESOPHAGEAL ECHOCARDIOGRAM NAME:  [name]   MRN: [id number] DOB:  10-15-1940   ADMIT DATE: 12/25/2023 INDICATIONS: Hypotension, recent pericardial bleed PROCEDURE: Informed consent was obtained prior to the procedure. The risks, benefits and alternatives for the procedure were discussed and the patient comprehended these risks.  Risks include, but are not limited to, cough, sore throat, vomiting, nausea, somnolence, esophageal and stomach trauma or perforation, bleeding, low blood pressure, aspiration, pneumonia, infection, trauma to the teeth  and death.  With sedation patient became progressively hypoxic requiring bag valve mask and worsening hypotension. She was intubated by Dr. Gretta at bedside prior to procedure. The transesophageal probe was inserted in the esophagus and stomach without difficulty and multiple views were obtained.  The patient was kept under observation until the patient left the procedure room.  The patient left the procedure room in stable condition.  COMPLICATIONS:  Complications: No complications. The patient had normal neuro status and respiratory status post procedure with vitals stable as recorded elsewhere.  Adequate airway was maintained throughout and vital signs monitored per protocol. KEY FINDINGS: Normal LV, moderately reduced RV function Biatrial dilation Small residual post procedure ASD with left to right shunting No evidence of pericardial effusion . Full Report to follow. [name] Advanced Heart Failure 9:30 PM    DG CHEST PORT 1 VIEW Result Date: 12/26/2023 CLINICAL DATA:  [id number] Encounter for intubation [id number] EXAM: PORTABLE CHEST - 1 VIEW COMPARISON:  12/25/2023 FINDINGS: Endotracheal tube terminates in the mid trachea. Right IJ approach central venous catheter terminates in the right atrium. Emphysema. Biapical pleural thickening, unchanged. Small bilateral pleural effusions. No new airspace consolidation or pneumothorax. No cardiomegaly. Tortuous aorta with atherosclerosis. Similarly positioned pericardial catheter. IMPRESSION: 1. Well-positioned endotracheal tube, which has been placed in the interim. No pneumothorax. Remaining support tubes and lines, unchanged. 2. Emphysema with similar small bilateral pleural effusions. Electronically Signed   By: Rogelia Myers M.D.   On: 12/26/2023 16:29   DG Abd Portable 1V Result Date: 12/26/2023 CLINICAL DATA:  NG tube placement EXAM: PORTABLE ABDOMEN - 1 VIEW COMPARISON:  CT chest 12/26/2023 FINDINGS: Enteric tube tip and side port at the proximal  stomach. Mild diffuse increased small bowel gas with more focal gas collection in right lower quadrant which may reflect air containing cecum. IMPRESSION: Enteric tube tip and side port at the proximal stomach. Electronically Signed   By: Luke Bun M.D.   On: 12/26/2023 16:23   CT CHEST WO CONTRAST Result Date: 12/26/2023 CLINICAL DATA:  Pericardial effusion status post pericardiocentesis. History of non-small-cell lung cancer status post partial right lobectomy. * Tracking Code: BO * EXAM: CT CHEST WITHOUT CONTRAST TECHNIQUE: Multidetector CT imaging of the chest was performed following the standard protocol without IV contrast. RADIATION DOSE REDUCTION: This exam was performed according to the departmental dose-optimization program which includes automated exposure control, adjustment of the mA and/or kV according to patient size and/or use of iterative reconstruction technique. COMPARISON:  Chest radiograph dated 12/25/2023, CT cardiac dated 05/22/2023 CT chest dated 04/23/2023 FINDINGS: Cardiovascular: Right IJ central venous catheter tip terminates at the  superior cavoatrial junction. Subxiphoid approach pericardial catheter terminates at the left AV groove. Postsurgical changes of the mitral valve. Right coronary artery stents. Multichamber cardiomegaly, predominantly involving the atria. No significant residual pericardial fluid/thickening. Great vessels are normal in course and caliber. Coronary artery calcifications and aortic atherosclerosis. Mediastinum/Nodes: Imaged thyroid  gland without nodules meeting criteria for imaging follow-up by size. Normal esophagus. No pathologically enlarged axillary, supraclavicular, mediastinal, or hilar lymph nodes. Lungs/Pleura: The central airways are patent. Secretions within the trachea. Postsurgical changes of the right lung. Similar severe centrilobular and paraseptal emphysema. 3 mm anterior right apical nodule (4:23) appears new. Unchanged 4 mm medial left  apical nodule (4:26). Diffuse subsegmental atelectasis of the right middle lobe, lingula, and bilateral lower lobes. No pneumothorax. Small bilateral pleural effusions, which appears partially loculated on the right. Upper abdomen: Partially imaged periportal fluid. Trace foci of gas along the pericardial catheter insertion site (2:50). Musculoskeletal: Multiple old right rib fractures. Unchanged mild compression fracture of T5 and T8. IMPRESSION: 1. No significant residual pericardial fluid/thickening with pericardial catheter in-situ. Trace foci of gas along the pericardial catheter insertion site, likely postprocedural. 2. Small bilateral pleural effusions, which appears partially loculated on the right. 3. New 3 mm anterior right apical nodule, indeterminate. 4. Aortic Atherosclerosis (ICD10-I70.0) and Emphysema (ICD10-J43.9). Coronary artery calcifications. Assessment for potential risk factor modification, dietary therapy or pharmacologic therapy may be warranted, if clinically indicated. Electronically Signed   By: Limin  Xu M.D.   On: 12/26/2023 13:16   ECHOCARDIOGRAM COMPLETE Result Date: 12/26/2023    ECHOCARDIOGRAM REPORT   Patient Name:   [name] Date of Exam: 12/26/2023 Medical Rec #:  [id number]      Height:       66.5 in Accession #:    [id number]     Weight:       159.2 lb Date of Birth:  10-16-1940      BSA:          1.825 m Patient Age:    83 years       BP:           99/50 mmHg Patient Gender: F              HR:           67 bpm. Exam Location:  Inpatient Procedure: 2D Echo, Color Doppler and Cardiac Doppler (Both Spectral and Color            Flow Doppler were utilized during procedure). Indications:    S/P mitral valve repair  History:        Patient has prior history of Echocardiogram examinations, most                 recent 12/25/2023. Mitral Valve Disease.  Sonographer:    Jayson Gaskins Referring Phys: [id number] [name] IMPRESSIONS  1. Left ventricular ejection fraction, by  estimation, is 60 to 65%. The left ventricle has normal function. The left ventricle has no regional wall motion abnormalities. There is mild concentric left ventricular hypertrophy. Left ventricular diastolic function could not be evaluated.  2. Right ventricular systolic function is normal. The right ventricular size is mildly enlarged.  3. Status post mitral clip, well seated in the A2-P2 position. The mitral valve has been repaired/replaced. No evidence of mitral valve regurgitation. Function mild mitral stenosis mitral stenosis.  4. Tricuspid valve regurgitation is mild to moderate.  5. The aortic valve is normal in structure. Aortic valve regurgitation is trivial. Aortic  valve sclerosis is present, with no evidence of aortic valve stenosis.  6. The inferior vena cava is normal in size with greater than 50% respiratory variability, suggesting right atrial pressure of 3 mmHg. FINDINGS  Left Ventricle: Left ventricular ejection fraction, by estimation, is 60 to 65%. The left ventricle has normal function. The left ventricle has no regional wall motion abnormalities. The left ventricular internal cavity size was normal in size. There is  mild concentric left ventricular hypertrophy. Left ventricular diastolic function could not be evaluated due to mitral valve replacement. Left ventricular diastolic function could not be evaluated. Right Ventricle: The right ventricular size is mildly enlarged. No increase in right ventricular wall thickness. Right ventricular systolic function is normal. Left Atrium: Left atrial size was normal in size. Right Atrium: Right atrial size was normal in size. Pericardium: There is no evidence of pericardial effusion. Presence of epicardial fat layer. Mitral Valve: Status post mitral clip, well seated in the A2-P2 position. The mitral valve has been repaired/replaced. No evidence of mitral valve regurgitation. There is a PASCAL ACE present in the mitral position. Function mild mitral  stenosis mitral valve stenosis. MV peak gradient, 8.6 mmHg. The mean mitral valve gradient is 4.7 mmHg. Tricuspid Valve: The tricuspid valve is normal in structure. Tricuspid valve regurgitation is mild to moderate. No evidence of tricuspid stenosis. Aortic Valve: The aortic valve is normal in structure. Aortic valve regurgitation is trivial. Aortic valve sclerosis is present, with no evidence of aortic valve stenosis. Aortic valve mean gradient measures 2.0 mmHg. Aortic valve peak gradient measures 3.4 mmHg. Aortic valve area, by VTI measures 2.17 cm. Pulmonic Valve: The pulmonic valve was normal in structure. Pulmonic valve regurgitation is not visualized. No evidence of pulmonic stenosis. Aorta: The aortic root is normal in size and structure. Venous: The inferior vena cava is normal in size with greater than 50% respiratory variability, suggesting right atrial pressure of 3 mmHg. IAS/Shunts: No atrial level shunt detected by color flow Doppler.  LEFT VENTRICLE PLAX 2D LVIDd:         4.05 cm   Diastology LVIDs:         2.79 cm   LV e' medial:    8.27 cm/s LV PW:         1.04 cm   LV E/e' medial:  15.8 LV IVS:        1.14 cm   LV e' lateral:   7.40 cm/s LVOT diam:     1.72 cm   LV E/e' lateral: 17.7 LV SV:         29 LV SV Index:   16 LVOT Area:     2.32 cm  LEFT ATRIUM             Index LA Vol (A2C):   44.2 ml 24.22 ml/m LA Vol (A4C):   51.0 ml 27.94 ml/m LA Biplane Vol: 49.6 ml 27.18 ml/m  AORTIC VALVE AV Area (Vmax):    2.16 cm AV Area (Vmean):   1.87 cm AV Area (VTI):     2.17 cm AV Vmax:           91.60 cm/s AV Vmean:          66.200 cm/s AV VTI:            0.134 m AV Peak Grad:      3.4 mmHg AV Mean Grad:      2.0 mmHg LVOT Vmax:         85.30  cm/s LVOT Vmean:        53.400 cm/s LVOT VTI:          0.125 m LVOT/AV VTI ratio: 0.93  AORTA Ao Root diam: 2.51 cm MITRAL VALVE MV Area (PHT): 1.12 cm     SHUNTS MV Area VTI:   0.48 cm     Systemic VTI:  0.12 m MV Peak grad:  8.6 mmHg     Systemic Diam: 1.72  cm MV Mean grad:  4.7 mmHg MV Vmax:       1.47 m/s MV Vmean:      102.9 cm/s MV Decel Time: 676 msec MV E velocity: 131.00 cm/s MV A velocity: 89.50 cm/s MV E/A ratio:  1.46 Kardie Tobb DO Electronically signed by Dub Huntsman DO Signature Date/Time: 12/26/2023/11:10:58 AM    Final    Medications:   Scheduled Medications:  arformoterol   15 mcg Nebulization BID   aspirin  EC  81 mg Oral Daily   atorvastatin   10 mg Oral Once per day on Monday Wednesday Friday   Chlorhexidine  Gluconate Cloth  6 each Topical Daily   cholecalciferol   1,000 Units Oral Daily   clopidogrel   75 mg Oral Daily   colchicine   0.6 mg Oral Daily   docusate  100 mg Per Tube BID   escitalopram   5 mg Oral Daily   famotidine   20 mg Per Tube BID   fentaNYL  (SUBLIMAZE ) injection  25-50 mcg Intravenous Once   melatonin  1.5 mg Oral QHS   mouth rinse  15 mL Mouth Rinse Q2H   pantoprazole  (PROTONIX ) IV  40 mg Intravenous Daily   polyethylene glycol  17 g Per Tube Daily   revefenacin   175 mcg Nebulization Daily   sodium chloride  flush  3 mL Intravenous Q12H    Infusions:  sodium chloride      ampicillin -sulbactam (UNASYN ) IV Stopped (12/27/23 0446)   dexmedetomidine  (PRECEDEX ) IV infusion 0.3 mcg/kg/hr (12/27/23 0700)   DOBUTamine  5 mcg/kg/min (12/27/23 0700)   fentaNYL  infusion INTRAVENOUS 50 mcg/hr (12/27/23 0700)   insulin  0.9 Units/hr (12/27/23 0700)   norepinephrine  (LEVOPHED ) Adult infusion 4 mcg/min (12/27/23 0700)   vasopressin  0.03 Units/min (12/27/23 0700)    PRN Medications: sodium chloride , acetaminophen , ALPRAZolam , azelastine , dextrose , famotidine , fentaNYL , HYDROmorphone  (DILAUDID ) injection, nitroGLYCERIN , mouth rinse, phenylephrine , prochlorperazine , sodium chloride  flush  Patient Profile  [name] is a 83 year old with history of mitral valve disease, carotid disease, breast cancer treated with resection, and 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 Admitted to ICU post Mitral Clip complicated by Pericardial  Effusion and shock.  Assessment/Plan   1. S/P mTEER complicated by pericardial effusion>>RV failure>>CGS - Pericardial Effusion post mitral clip. Urgent Pericardiocentesis with 350 cc of bloody output.  - Now on renally dose colchicine  0.6 mg to daily with elevated SCr - Repeat echo post pericardiocentesis with well placed clip with mild gradient 8 mmHh, minimal MR, mild RV dysfunction, mod-severe TR, no effusion - Co-ox 70%.  - Lactic acid up to 6.6 9/11. Now down trending. Last 3 this morning.  - L/RHC 9/11: with equalization of filling pressures and severely reduced cardiac index by Fick, but no evidence of coronary disease> concern for potential tamponade. TCTS notified, chest CT with no evidence of swelling or effusion. TCTS signed off.  - TEE 9/11: No localized swelling or effusion - Now on NE @ 4, DBA @ 5 and vaso @ 0.03. Wean as tolerated.  - Limited echo 9/11 with no evidence of pericardial effusion -  Plan to repeat limited echo today. If no pericardial effusion will plan to remove drain   2. Mitral Valve Disease, S/P MTEER , Clip x2.  See above.    3. CAD 11/22/23 DES RCA.  Continue DAPT (ASA/plavix )  4. AKI> ARF - SCr 1.13>1.62>2.14>2.51>3.06 - Baseline SCr ~.9 - Avoid hypotension - Will trial diuretic this morning. CVP 11 and weight up 13lbs from admission. Start with 80 IV lasix  x1. Follow response.   5. Hyperkalemia - Resolved  6. Acute respiratory failure - Required intubation during TEE 2/2 increased secretions and poor oxygenation - Mgmt per PCCM - Suspect she may be stable for extubation today  CRITICAL CARE Performed by: Beckey LITTIE Coe  Total critical care time: 16 minutes  Critical care time was exclusive of separately billable procedures and treating other patients.  Critical care was necessary to treat or prevent imminent or life-threatening deterioration.  Critical care was time spent personally by me on the following activities: development of treatment  plan with patient and/or surrogate as well as nursing, discussions with consultants, evaluation of patient's response to treatment, examination of patient, obtaining history from patient or surrogate, ordering and performing treatments and interventions, ordering and review of laboratory studies, ordering and review of radiographic studies, pulse oximetry and re-evaluation of patient's condition.   Length of Stay: 2  Beckey LITTIE Coe, NP  12/27/2023, 7:13 AM  Advanced Heart Failure Team Pager [phone] (M-F; 7a - 5p)  Please contact [facility] for night-coverage after hours (5p -7a ) and weekends on [email or web address]

## 2023-12-27 NOTE — Progress Notes (Signed)
 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 NYHA glass II symptoms w/ fatigue, dizziness on occasion. She has been on DAPT. Last TEE EF 55-50% svr MR. Seen by cardiac surg in consultation on 9/5 and felt to be poor candidate for surgical repair. Presented to [facility] on 9/10 for svr MR where she underwent planned mitral clip.  Post procedure developed acute hypotension, SOB and CP. Treated w/ albumin , stat echo showed large pericardial effusion. Had emergent pericardicentesis where was removed and drain placed. Sbp Subsequently improved, but then hypotensive again.. Transferred to ICU post procedure. Got 1 liter NS for POCUS suggesting volume depletion. Started on norepinephrine , right internal jugular CVL was placed for co-ox and CVP monitoring. PCCM asked to assist w/ RX Pertinent  Medical History  CAD s/p PCI w/ DESx3 ostial-distal RCA on 8/8. Her LEVDP was 27 MVR (severe) w/ flail segment and mod to severe tricuspid regurg.  Baseline NYHA class II HLD Bilateral Carotid artery stenosis HTN Lung cancer  Significant Hospital Events: Including procedures, antibiotic start and stop dates in addition to other pertinent events   9/9 admitted for mitral clipping. Developed acute onset of SOB, CP and hypotension post procedure. Had lg pericardial effusion. 350 ml drawn off and drain placed. Initially responded but subsequently developed hypotension. Started on NE. CVL placed. PCCM consulted to assist w care. On arrival reports severe chest and substernal chest pain. No sig output from drain since placement  9/11: intubated for TEE, RHC, angio  9/12: extubated   Interim History / Subjective:  Extubated this morning.  Transitioned off insulin  gtt. Still on dobutamine  and low dose levo, vaso. Had [facility] and angio yesterday with equalization of filling pressures and severely reduced function, no evidence of coronary disease  Objective    Blood pressure (!) 119/55, pulse 80, temperature 97.6 F (36.4 C), temperature source Axillary, resp. rate 18, height 5' 6.5 (1.689 m), weight 74 kg, SpO2 98%. CVP:  [5 mmHg-40 mmHg] 11 mmHg  Vent Mode: PRVC FiO2 (%):  [40 %-100 %] 40 % Set Rate:  [18 bmp] 18 bmp Vt Set:  [480 mL] 480 mL PEEP:  [5 cmH20] 5 cmH20 Plateau Pressure:  [14 cmH20-15 cmH20] 15 cmH20   Intake/Output Summary (Last 24 hours) at 12/27/2023 1003 Last data filed at 12/27/2023 0900 Gross per 24 hour  Intake 1171.9 ml  Output 360 ml  Net 811.9 ml   Filed Weights   12/25/23 0711 12/26/23 0300 12/27/23 0500  Weight: 68 kg 72.2 kg 74 kg    Examination: General: elderly female, critically ill appearing, laying in bed  HENT NCAT no JVD MMM Lungs: vented, decreased bases, synchronous  Cardiovascular: s1s2, no murmur, rub, gallop  Abdomen: soft not tender  Extremities: warm no sig edema Neuro: wakes easily to voice, follows commands   Resolved problem list   Assessment and Plan  Shock; likely obstructive in setting of tamponade vs cardiogenic hemorrhagic pericardial effusion, pericarditis  RV failure  S/p pericardiocentesis 9/10 with 400cc blood removed; had [facility] 9/11 demonstrating equalization of filling pressures and severe reduced cardiac index to 1.8 by Fick. Severe TR. No obstructive CAD, stent patent. Lactic slowly coming down  -  advanced heart failure primary, appreciate management  - pericardial drain removed today  - con't dobutamine  @5   - single dose lasix  80mg  IV with elevated CVP, 13lb up on admission and sCr creeping up  - con't colchicine  0.6mg  daily  - con't levo, vaso for MAP > 65  - trend lactic acid  Severe MR s/p mTEER  CAD  HLD TEE yesterday with adequate placement  of clip. Minimal MR, mild RV dysfunction. Severe TR - monitor groin site  - con't ASA 81mg  daily > temp increase to 325mg  daily for pericarditis  - con't plavix  75mg  daily  - statin - appreciate cardiology support   Acute hypoxic respiratory failure  Concern for aspiration event Required intubation during TEE. Initially using NRB; however, desat during procedure requiring intubation.  - extubated this morning to nasal cannula  - con't unasyn  for 5 days  - continue brovana , yupelri   - IS, pulmonary hygiene   Elevated blood glucose likely stress related  - transitioned off insulin  gtt this morning  - ssi  - q4h bg monitoring   Acute kidney injury  Hyperkalemia, resolved  Suspect cardiorenal? CVP is elevated. Weight up on admission  - single dose lasix  80 today  - trend bmp, mag, phos - replete elytes - strict I&O - Avoid nephrotoxic agents, renally dose medications - ensure adequate renal perfusion   H/o lung cancer remote Plan F/u out pt   Labs   CBC: Recent Labs  Lab 12/20/23 1343 12/25/23 1359 12/26/23 0313 12/26/23 0700 12/26/23 0752 12/26/23 1103 12/26/23 1317 12/26/23 1554 12/27/23 0448 12/27/23 0453  WBC 4.7   < > 14.6* 13.3* 13.7*  --  15.3*  --   --  9.5  NEUTROABS 3.0  --   --   --   --   --   --   --   --   --   HGB 11.5   < > 10.8* 10.9* 11.0* 10.5*  10.2* 10.4* 9.2* 9.9* 9.8*  HCT 36.6   < > 34.3* 35.5* 35.3* 31.0*  30.0* 32.9* 27.0* 29.0* 30.1*  MCV 95   < > 97.2 98.6 99.7  --  98.8  --   --  94.4  PLT 173   < > 299 300 275  --  241  --   --  158   < > = values in this interval not displayed.    Basic Metabolic Panel: Recent Labs  Lab 12/25/23 1359 12/26/23 0313 12/26/23 0752 12/26/23 1103 12/26/23 1317 12/26/23 1554 12/27/23 0448 12/27/23 0453  NA 139 136 136 138  138 136 145 139 138  K 3.6 5.2* 5.8* 4.9  4.9 4.5 4.0 4.3 4.2  CL 111 105 103  --  103  --   --  104  CO2 18* 16* 15*  --  19*  --   --  19*  GLUCOSE 198* 266* 274*   --  247*  --   --  220*  BUN 30* 37* 39*  --  44*  --   --  54*  CREATININE 1.13* 1.62* 2.14*  --  2.51*  --   --  3.06*  CALCIUM  7.3* 7.2* 7.0*  --  7.1*  --   --  7.2*   GFR: Estimated Creatinine Clearance: 14.7 mL/min (A) (by C-G formula based on SCr of 3.06 mg/dL (H)). Recent Labs  Lab 12/26/23 0700 12/26/23 0752 12/26/23 0926 12/26/23 1317 12/26/23 1340 12/26/23 1619 12/26/23 2318 12/27/23 0453 12/27/23 0656 12/27/23 0900  WBC 13.3* 13.7*  --  15.3*  --   --   --  9.5  --   --   LATICACIDVEN  --   --    < >  --    < > 5.3* 5.6*  --  3.0* 2.7*   < > = values in this interval not displayed.    Liver Function Tests: Recent Labs  Lab 12/20/23 1343 12/25/23 1359  AST 30 62*  ALT 17 38  ALKPHOS 59 34*  BILITOT 0.3 1.0  PROT 7.0 5.7*  ALBUMIN  4.4 3.2*   No results for input(s): LIPASE, AMYLASE in the last 168 hours. No results for input(s): AMMONIA in the last 168 hours.  ABG    Component Value Date/Time   PHART 7.336 (L) 12/27/2023 0448   PCO2ART 40.3 12/27/2023 0448   PO2ART 169 (H) 12/27/2023 0448   HCO3 21.7 12/27/2023 0448   TCO2 23 12/27/2023 0448   ACIDBASEDEF 4.0 (H) 12/27/2023 0448   O2SAT 70 12/27/2023 0453     Coagulation Profile: Recent Labs  Lab 12/20/23 1343  INR 1.0    Cardiac Enzymes: No results for input(s): CKTOTAL, CKMB, CKMBINDEX, TROPONINI in the last 168 hours.  HbA1C: No results found for: HGBA1C  CBG: Recent Labs  Lab 12/27/23 0333 12/27/23 0447 12/27/23 0551 12/27/23 0655 12/27/23 0759  GLUCAP 222* 215* 205* 163* 151*    CC time: 32 mins   Tinnie FORBES Furth, PA-C [facility] 12/27/23 11:22 AM  Please see [email or web address] for pager details.  From 7A-7P if no response, please call [phone] After hours, please call ELink [phone]

## 2023-12-27 NOTE — Plan of Care (Signed)
  Problem: Clinical Measurements: Goal: Diagnostic test results will improve Outcome: Not Progressing Goal: Respiratory complications will improve Outcome: Progressing Goal: Cardiovascular complication will be avoided Outcome: Not Progressing   Problem: Elimination: Goal: Will not experience complications related to bowel motility Outcome: Not Progressing   Problem: Pain Managment: Goal: General experience of comfort will improve and/or be controlled Outcome: Progressing

## 2023-12-28 ENCOUNTER — Inpatient Hospital Stay (HOSPITAL_COMMUNITY)

## 2023-12-28 DIAGNOSIS — I3139 Other pericardial effusion (noninflammatory): Secondary | ICD-10-CM | POA: Diagnosis not present

## 2023-12-28 DIAGNOSIS — R570 Cardiogenic shock: Secondary | ICD-10-CM | POA: Diagnosis not present

## 2023-12-28 DIAGNOSIS — Z95818 Presence of other cardiac implants and grafts: Secondary | ICD-10-CM | POA: Diagnosis not present

## 2023-12-28 DIAGNOSIS — Z9889 Other specified postprocedural states: Secondary | ICD-10-CM | POA: Diagnosis not present

## 2023-12-28 LAB — BASIC METABOLIC PANEL WITH GFR
Anion gap: 17 — ABNORMAL HIGH (ref 5–15)
Anion gap: 19 — ABNORMAL HIGH (ref 5–15)
BUN: 74 mg/dL — ABNORMAL HIGH (ref 8–23)
BUN: 82 mg/dL — ABNORMAL HIGH (ref 8–23)
CO2: 19 mmol/L — ABNORMAL LOW (ref 22–32)
CO2: 15 mmol/L — ABNORMAL LOW (ref 22–32)
Calcium: 7 mg/dL — ABNORMAL LOW (ref 8.9–10.3)
Calcium: 7.5 mg/dL — ABNORMAL LOW (ref 8.9–10.3)
Chloride: 97 mmol/L — ABNORMAL LOW (ref 98–111)
Chloride: 98 mmol/L (ref 98–111)
Creatinine, Ser: 4.45 mg/dL — ABNORMAL HIGH (ref 0.44–1.00)
Creatinine, Ser: 5.04 mg/dL — ABNORMAL HIGH (ref 0.44–1.00)
GFR, Estimated: 9 mL/min — ABNORMAL LOW (ref >60)
GFR, Estimated: 8 mL/min — ABNORMAL LOW (ref >60)
Glucose, Bld: 108 mg/dL — ABNORMAL HIGH (ref 70–99)
Glucose, Bld: 112 mg/dL — ABNORMAL HIGH (ref 70–99)
Potassium: 5.1 mmol/L (ref 3.5–5.1)
Potassium: 5.9 mmol/L — ABNORMAL HIGH (ref 3.5–5.1)
Sodium: 133 mmol/L — ABNORMAL LOW (ref 135–145)
Sodium: 132 mmol/L — ABNORMAL LOW (ref 135–145)

## 2023-12-28 LAB — CBC
HCT: 26.5 % — ABNORMAL LOW (ref 36.0–46.0)
HCT: 27.8 % — ABNORMAL LOW (ref 36.0–46.0)
HCT: 27.2 % — ABNORMAL LOW (ref 36.0–46.0)
Hemoglobin: 8.6 g/dL — ABNORMAL LOW (ref 12.0–15.0)
Hemoglobin: 9 g/dL — ABNORMAL LOW (ref 12.0–15.0)
Hemoglobin: 8.7 g/dL — ABNORMAL LOW (ref 12.0–15.0)
MCH: 30.9 pg (ref 26.0–34.0)
MCH: 30.7 pg (ref 26.0–34.0)
MCH: 30.6 pg (ref 26.0–34.0)
MCHC: 32.5 g/dL (ref 30.0–36.0)
MCHC: 32.4 g/dL (ref 30.0–36.0)
MCHC: 32 g/dL (ref 30.0–36.0)
MCV: 95.3 fL (ref 80.0–100.0)
MCV: 94.9 fL (ref 80.0–100.0)
MCV: 95.8 fL (ref 80.0–100.0)
Platelets: 74 K/uL — ABNORMAL LOW (ref 150–400)
Platelets: 73 K/uL — ABNORMAL LOW (ref 150–400)
Platelets: 61 K/uL — ABNORMAL LOW (ref 150–400)
RBC: 2.78 MIL/uL — ABNORMAL LOW (ref 3.87–5.11)
RBC: 2.93 MIL/uL — ABNORMAL LOW (ref 3.87–5.11)
RBC: 2.84 MIL/uL — ABNORMAL LOW (ref 3.87–5.11)
RDW: 13.5 % (ref 11.5–15.5)
RDW: 13.6 % (ref 11.5–15.5)
RDW: 13.7 % (ref 11.5–15.5)
WBC: 9.3 K/uL (ref 4.0–10.5)
WBC: 10.9 K/uL — ABNORMAL HIGH (ref 4.0–10.5)
WBC: 10.9 K/uL — ABNORMAL HIGH (ref 4.0–10.5)
nRBC: 3 % — ABNORMAL HIGH (ref 0.0–0.2)
nRBC: 2.6 % — ABNORMAL HIGH (ref 0.0–0.2)
nRBC: 3 % — ABNORMAL HIGH (ref 0.0–0.2)

## 2023-12-28 LAB — COOXEMETRY PANEL
Carboxyhemoglobin: 0.9 % (ref 0.5–1.5)
Carboxyhemoglobin: 1.2 % (ref 0.5–1.5)
Carboxyhemoglobin: 1.1 % (ref 0.5–1.5)
Methemoglobin: <0.7 % (ref 0.0–1.5)
Methemoglobin: <0.7 % (ref 0.0–1.5)
Methemoglobin: <0.7 % (ref 0.0–1.5)
O2 Saturation: 55.6 %
O2 Saturation: 58.1 %
O2 Saturation: 53 %
Total hemoglobin: 9.1 g/dL — ABNORMAL LOW (ref 12.0–16.0)
Total hemoglobin: 9 g/dL — ABNORMAL LOW (ref 12.0–16.0)
Total hemoglobin: 9.5 g/dL — ABNORMAL LOW (ref 12.0–16.0)

## 2023-12-28 LAB — GLUCOSE, CAPILLARY
Glucose-Capillary: 90 mg/dL (ref 70–99)
Glucose-Capillary: 114 mg/dL — ABNORMAL HIGH (ref 70–99)
Glucose-Capillary: 92 mg/dL (ref 70–99)
Glucose-Capillary: 95 mg/dL (ref 70–99)

## 2023-12-28 LAB — CG4 I-STAT (LACTIC ACID)
Lactic Acid, Venous: 3.4 mmol/L (ref 0.5–1.9)
Lactic Acid, Venous: 4.8 mmol/L (ref 0.5–1.9)
Lactic Acid, Venous: 5.2 mmol/L (ref 0.5–1.9)

## 2023-12-28 MED ORDER — SODIUM BICARBONATE 650 MG PO TABS
1300.0000 mg | ORAL_TABLET | Freq: Four times a day (QID) | ORAL | Status: DC
  Administered 2023-12-28: 1300 mg via ORAL
  Filled 2023-12-28: qty 2

## 2023-12-28 MED ORDER — POLYETHYLENE GLYCOL 3350 17 G PO PACK
17.0000 g | PACK | Freq: Every day | ORAL | Status: DC
  Administered 2023-12-28 – 2023-12-29 (×2): 17 g
  Filled 2023-12-28: qty 1

## 2023-12-28 MED ORDER — SODIUM ZIRCONIUM CYCLOSILICATE 10 G PO PACK
10.0000 g | PACK | Freq: Four times a day (QID) | ORAL | Status: DC
  Administered 2023-12-28: 10 g via ORAL
  Filled 2023-12-28: qty 1

## 2023-12-28 MED ORDER — ESCITALOPRAM OXALATE 10 MG PO TABS
5.0000 mg | ORAL_TABLET | Freq: Every day | ORAL | Status: DC
  Administered 2023-12-29: 5 mg
  Filled 2023-12-28: qty 1

## 2023-12-28 MED ORDER — SODIUM CHLORIDE 0.9 % IV SOLN
2.0000 g | INTRAVENOUS | Status: DC
  Administered 2023-12-28: 2 g via INTRAVENOUS
  Filled 2023-12-28 (×2): qty 20

## 2023-12-28 MED ORDER — ATORVASTATIN CALCIUM 10 MG PO TABS
10.0000 mg | ORAL_TABLET | ORAL | Status: DC
Start: 2023-12-30 — End: 2023-12-29

## 2023-12-28 MED ORDER — SODIUM BICARBONATE 8.4 % IV SOLN
50.0000 meq | Freq: Once | INTRAVENOUS | Status: AC
  Administered 2023-12-28: 50 meq via INTRAVENOUS
  Filled 2023-12-28: qty 50

## 2023-12-28 MED ORDER — COLCHICINE 0.3 MG HALF TABLET: 0.3000 mg | ORAL_TABLET | Freq: Every day | ORAL | Status: DC

## 2023-12-28 MED ORDER — FAMOTIDINE 20 MG PO TABS: 10.0000 mg | ORAL_TABLET | Freq: Every day | ORAL | Status: DC | PRN

## 2023-12-28 MED ORDER — MELATONIN 3 MG PO TABS: 1.5000 mg | ORAL_TABLET | Freq: Every day | ORAL | Status: DC

## 2023-12-28 MED ORDER — CALCIUM GLUCONATE-NACL 2-0.675 GM/100ML-% IV SOLN
2.0000 g | Freq: Once | INTRAVENOUS | Status: AC
  Administered 2023-12-28: 2000 mg via INTRAVENOUS
  Filled 2023-12-28: qty 100

## 2023-12-28 MED ORDER — ACETAMINOPHEN 325 MG PO TABS: 650.0000 mg | ORAL_TABLET | ORAL | Status: DC | PRN

## 2023-12-28 MED ORDER — METHOCARBAMOL 500 MG PO TABS: 500.0000 mg | ORAL_TABLET | Freq: Three times a day (TID) | ORAL | Status: DC | PRN

## 2023-12-28 MED ORDER — THIAMINE HCL 100 MG/ML IJ SOLN
500.0000 mg | Freq: Three times a day (TID) | INTRAVENOUS | Status: AC
  Administered 2023-12-28 (×2): 500 mg via INTRAVENOUS
  Filled 2023-12-28 (×3): qty 5

## 2023-12-28 MED ORDER — ASPIRIN 81 MG PO CHEW
650.0000 mg | CHEWABLE_TABLET | Freq: Three times a day (TID) | ORAL | Status: DC
  Administered 2023-12-28: 650 mg
  Filled 2023-12-28: qty 9

## 2023-12-28 MED ORDER — SENNOSIDES-DOCUSATE SODIUM 8.6-50 MG PO TABS
2.0000 | ORAL_TABLET | Freq: Every day | ORAL | Status: DC
  Filled 2023-12-28: qty 2

## 2023-12-28 MED ORDER — SODIUM ZIRCONIUM CYCLOSILICATE 10 G PO PACK
10.0000 g | PACK | Freq: Four times a day (QID) | ORAL | Status: AC
  Administered 2023-12-29: 10 g
  Filled 2023-12-28: qty 1

## 2023-12-28 MED ORDER — VITAMIN D 25 MCG (1000 UNIT) PO TABS
1000.0000 [IU] | ORAL_TABLET | Freq: Every day | ORAL | Status: DC
  Administered 2023-12-29: 1000 [IU]
  Filled 2023-12-28: qty 1

## 2023-12-28 MED ORDER — COLCHICINE 0.3 MG HALF TABLET
0.3000 mg | ORAL_TABLET | Freq: Every day | ORAL | Status: DC
  Filled 2023-12-28: qty 1

## 2023-12-28 MED ORDER — CLOPIDOGREL BISULFATE 75 MG PO TABS
75.0000 mg | ORAL_TABLET | Freq: Every day | ORAL | Status: DC
Start: 2023-12-29 — End: 2023-12-30
  Administered 2023-12-29: 75 mg
  Filled 2023-12-28: qty 1

## 2023-12-28 MED ORDER — PANTOPRAZOLE SODIUM 40 MG IV SOLR
40.0000 mg | Freq: Two times a day (BID) | INTRAVENOUS | Status: DC
  Administered 2023-12-28 – 2023-12-30 (×4): 40 mg via INTRAVENOUS
  Filled 2023-12-28 (×4): qty 10

## 2023-12-28 MED ORDER — SODIUM BICARBONATE 650 MG PO TABS
1300.0000 mg | ORAL_TABLET | Freq: Four times a day (QID) | ORAL | Status: AC
  Administered 2023-12-29: 1300 mg
  Filled 2023-12-28 (×2): qty 2

## 2023-12-28 MED ORDER — FUROSEMIDE 10 MG/ML IJ SOLN
160.0000 mg | Freq: Once | INTRAVENOUS | Status: AC
  Administered 2023-12-28: 160 mg via INTRAVENOUS
  Filled 2023-12-28: qty 10

## 2023-12-28 MED ORDER — ALPRAZOLAM 0.25 MG PO TABS: 0.2500 mg | ORAL_TABLET | Freq: Every day | ORAL | Status: DC | PRN

## 2023-12-28 NOTE — Evaluation (Signed)
 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 Wendel   Past Surgical History:  Past Surgical History:  Procedure Laterality Date   ABDOMINAL HYSTERECTOMY     partial   APPENDECTOMY     BREAST EXCISIONAL BIOPSY Bilateral    benign   BREAST SURGERY Bilateral    bx    CORONARY STENT INTERVENTION N/A 11/22/2023   Procedure: CORONARY STENT INTERVENTION;  Surgeon: Thukkani, Arun K, MD;  Location: [facility] INVASIVE CV LAB;  Service: Cardiovascular;  Laterality: N/A;   EYE SURGERY Bilateral    cataracts   LUNG REMOVAL, PARTIAL Right 01/26/2013   for Stage IA non small cell   PERICARDIOCENTESIS N/A 12/25/2023   Procedure: PERICARDIOCENTESIS;  Surgeon: Wendel Lurena POUR, MD;  Location: [facility] INVASIVE CV LAB;  Service: Cardiovascular;  Laterality: N/A;   RIGHT/LEFT HEART CATH AND CORONARY ANGIOGRAPHY N/A 11/22/2023   Procedure: RIGHT/LEFT HEART CATH AND CORONARY ANGIOGRAPHY;  Surgeon: Wendel Lurena POUR, MD;  Location: [facility] INVASIVE CV LAB;  Service: Cardiovascular;  Laterality: N/A;   RIGHT/LEFT HEART CATH AND CORONARY ANGIOGRAPHY N/A 12/26/2023   Procedure: RIGHT/LEFT HEART CATH AND CORONARY ANGIOGRAPHY;  Surgeon: Zenaida Morene PARAS, MD;  Location: [facility] INVASIVE CV LAB;  Service: Cardiovascular;  Laterality: N/A;   TONSILLECTOMY     TRANSCATHETER MITRAL EDGE TO EDGE REPAIR N/A 12/25/2023    Procedure: TRANSCATHETER MITRAL EDGE TO EDGE REPAIR;  Surgeon: Wendel Lurena POUR, MD;  Location: [facility] INVASIVE CV LAB;  Service: Cardiovascular;  Laterality: N/A;   TRANSESOPHAGEAL ECHOCARDIOGRAM (CATH LAB) N/A 11/22/2023   Procedure: TRANSESOPHAGEAL ECHOCARDIOGRAM;  Surgeon: Delford Maude BROCKS, MD;  Location: [facility] INVASIVE CV LAB;  Service: Cardiovascular;  Laterality: N/A;   TRANSESOPHAGEAL ECHOCARDIOGRAM (CATH LAB) N/A 12/25/2023   Procedure: TRANSESOPHAGEAL ECHOCARDIOGRAM;  Surgeon: Wendel Lurena POUR, MD;  Location: [facility] INVASIVE CV LAB;  Service: Cardiovascular;  Laterality: N/A;   VIDEO ASSISTED THORACOSCOPY (VATS)/WEDGE RESECTION Right 01/26/2013   Procedure: VIDEO ASSISTED THORACOSCOPY (VATS) with wedge resection right middle lobe nodule , segmental resection rigth lower lobe lung, lymph node sampling,;  Surgeon: Elspeth BROCKS Millers, MD;  Location: [facility] OR;  Service: Thoracic;  Laterality: Right;   HPI:  83 year old with history of mitral valve disease, carotid disease, breast cancer treated with resection, and 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 Admitted to ICU post Mitral Clip complicated by Pericardial Effusion and shock. Intubated 9/11-912 during TEE secondary to increased secretions and poor oxygenation. Per pt and RN report, pt with bilateral numbness to chin and difficulty swallowing.    Assessment / Plan / Recommendation  Clinical Impression  Pt reporting some novel bilateral numbness to mandibular region of the face starting this am per pt and RN report. She also reports increased difficutly to trigger a swallow. Per RN pt with upcoming CT of head this date  to further assess neurological symptoms. SLP performed oral care. Pt with pale appearance, looking very fatigued, She participated in PT not long before SLP arrival. Pt was able to swallow per palpation on command and during PO trials of ice chips, sips of water , and small 1/2 tsp of puree. She notes discomfort in throat region (odynophagia) and  has bilateral bruising to tongue region (suspect she bite tongue some during recent intubation). There were no overt s/sx of aspiration during isolated PO trials though her ability to trigger a swallow appeared more effortful and with a suspected delay in swallow initiation. Recommend full liquid diet with meds crushed in puree. SLP to closely monitor for diet tolerance and advancement. SLP Visit Diagnosis: Dysphagia, unspecified (R13.10)    Aspiration Risk  Mild aspiration risk    Diet Recommendation   Thin (full liquid diet)  Medication Administration: Crushed with puree    Other  Recommendations Oral Care Recommendations: Oral care BID     Assistance Recommended at Discharge    Functional Status Assessment Patient has had a recent decline in their functional status and demonstrates the ability to make significant improvements in function in a reasonable and predictable amount of time.  Frequency and Duration min 2x/week  2 weeks;1 week       Prognosis Prognosis for improved oropharyngeal function: Fair Barriers to Reach Goals: Time post onset;Severity of deficits      Swallow Study   General Date of Onset: 12/25/23 HPI: 83 year old with history of mitral valve disease, carotid disease, breast cancer treated with resection, and 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 Admitted to ICU post Mitral Clip complicated by Pericardial Effusion and shock. Intubated 9/11-912 during TEE secondary to increased secretions and poor oxygenation. Per pt and RN report, pt with bilateral numbness to chin and difficulty swallowing. Type of Study: Bedside Swallow Evaluation Previous Swallow Assessment: none on file Diet Prior to this Study: Thin liquids (Level 0) Temperature Spikes Noted: No Respiratory Status: Nasal cannula History of Recent Intubation: Yes Total duration of intubation (days): 2 days Date extubated: 12/27/23 Behavior/Cognition: Requires cueing;Cooperative (appears tired with low  endurance) Oral Cavity Assessment: Other (comment);Dry (some lingual bruising bilaterally ; suspect pt bit tongue during intubation some) Oral Care Completed by SLP: Yes Oral Cavity - Dentition: Adequate natural dentition Vision: Functional for self-feeding Self-Feeding Abilities: Needs assist Patient Positioning: Upright in bed Baseline Vocal Quality: Low vocal intensity Volitional Cough: Weak Volitional Swallow: Able to elicit    Oral/Motor/Sensory Function Overall Oral Motor/Sensory Function: Generalized oral weakness Facial ROM: Within Functional Limits Facial Symmetry: Within Functional Limits Facial Strength: Within Functional Limits Facial Sensation: Other (Comment) (mandibular region wtih bilateral decreased sensation per pt) Lingual ROM: Within Functional Limits Mandible: Impaired   Ice Chips Ice chips: Impaired Presentation: Spoon Oral Phase Impairments: Reduced lingual movement/coordination Oral Phase Functional Implications: Prolonged oral transit Pharyngeal Phase Impairments: Suspected delayed Swallow;Multiple swallows   Thin Liquid Thin Liquid: Impaired Presentation: Cup;Straw Oral Phase Impairments: Reduced labial seal Oral Phase Functional Implications: Right anterior spillage;Prolonged oral transit Pharyngeal  Phase Impairments: Suspected delayed Swallow;Multiple swallows    Nectar Thick Nectar Thick Liquid: Not tested   Honey Thick Honey Thick Liquid: Not tested   Puree Puree: Impaired Presentation: Spoon Oral Phase Impairments: Reduced lingual movement/coordination Pharyngeal Phase Impairments: Suspected delayed Swallow;Multiple swallows (reports some odynophagia)   Solid     Solid: Not tested (pt with discomfort of throat likely post intubation related)      Raghad Lorenz H.  MA, CCC-SLP Acute Rehabilitation Services ' 12/28/2023,11:46 AM

## 2023-12-28 NOTE — Evaluation (Signed)
 Physical Therapy Evaluation Patient Details Name: [name] MRN: [id number] DOB: 1940/10/15 Today's Date: 12/28/2023  History of Present Illness  83 year old woman who presented 9/10 for mitral clip. Procedure was complicated by pericardial effusion. PMH: severe mitral regurgitation, tricuspid regurgitation, coronary artery disease status post recent stent in 11/2023.  Clinical Impression  Patient is s/p above surgery presenting with functional limitations due to the deficits listed below (see PT Problem List). Previously independent, living alone but has a daughter that lives near by. She required min assist for bed mobility and up to mod assist for stand pivot transfer x2 from bed to [facility], and BSC to recliner. + dizziness, unable to stand long enough to check standing BP but after sitting MAP decreased to 67 and pt became pale and less conversant with slurred speech. Gradually improved after transfer to recliner and feet up. Would favor CIR follow for now. Will update recs as pt progresses during acute stay. Patient will benefit from intensive inpatient follow-up therapy, >3 hours/day.  Patient will benefit from acute skilled PT to increase their independence and safety with mobility to facilitate discharge.         If plan is discharge home, recommend the following: A lot of help with walking and/or transfers;A lot of help with bathing/dressing/bathroom;Assistance with cooking/housework;Assist for transportation;Help with stairs or ramp for entrance   Can travel by private vehicle        Equipment Recommendations Rolling walker (2 wheels) (pending progress)  Recommendations for Other Services  Rehab consult    Functional Status Assessment Patient has had a recent decline in their functional status and demonstrates the ability to make significant improvements in function in a reasonable and predictable amount of time.     Precautions / Restrictions Precautions Precautions:  Fall Recall of Precautions/Restrictions: Impaired Precaution/Restrictions Comments: watch BP Restrictions Weight Bearing Restrictions Per Provider Order: No      Mobility  Bed Mobility Overal bed mobility: Needs Assistance Bed Mobility: Supine to Sit     Supine to sit: Min assist     General bed mobility comments: Min assist to rise to edge of bed and scoot forward. Cues for technique    Transfers Overall transfer level: Needs assistance Equipment used: 1 person hand held assist Transfers: Sit to/from Stand, Bed to chair/wheelchair/BSC Sit to Stand: Mod assist   Step pivot transfers: Mod assist       General transfer comment: Mod assist for boost to stand and step pivot transfer from bed to [facility], and BSC to recliner. Cues for technique and upright stand, (poorly tolerated standing up straight.) Could not stand long enough for BP check (dizzy +)    Ambulation/Gait               General Gait Details: Deferred - dizzy, concern for significant hypotension  Stairs            Wheelchair Mobility     Tilt Bed    Modified Rankin (Stroke Patients Only)       Balance Overall balance assessment: Needs assistance Sitting-balance support: No upper extremity supported, Feet supported Sitting balance-Leahy Scale: Fair Sitting balance - Comments: CGA   Standing balance support: Single extremity supported Standing balance-Leahy Scale: Poor Standing balance comment: Up to mod assist with hand held support on Rt, (Aline in Lt wrist)                             Pertinent Vitals/Pain  Pain Assessment Pain Assessment: Faces Faces Pain Scale: Hurts little more Pain Descriptors / Indicators: Aching Pain Intervention(s): Monitored during session, Repositioned, Limited activity within patient's tolerance    Home Living Family/patient expects to be discharged to:: Private residence Living Arrangements: Alone Available Help at Discharge: Family;Available  PRN/intermittently (Dtr) Type of Home: House Home Access: Stairs to enter Entrance Stairs-Rails: None Entrance Stairs-Number of Steps: 1   Home Layout: One level Home Equipment: Cane - quad      Prior Function Prior Level of Function : Independent/Modified Independent;Driving             Mobility Comments: Typically no device, but if arthritis bothersome would use quad cane. No falls past 6 mo ADLs Comments: ind driving     Extremity/Trunk Assessment   Upper Extremity Assessment Upper Extremity Assessment: Defer to OT evaluation    Lower Extremity Assessment Lower Extremity Assessment: Generalized weakness       Communication   Communication Communication: No apparent difficulties    Cognition Arousal: Lethargic Behavior During Therapy: WFL for tasks assessed/performed   PT - Cognitive impairments: No apparent impairments                         Following commands: Intact       Cueing Cueing Techniques: Verbal cues     General Comments General comments (skin integrity, edema, etc.): BP 136/57 HR 76, SPO2 95% on 3L in bed. MAP dropped to 67 with activity during transfers while sitting.    Exercises General Exercises - Lower Extremity Ankle Circles/Pumps: AROM, Both, 10 reps, Seated Quad Sets: Strengthening, Both, 10 reps, Seated   Assessment/Plan    PT Assessment Patient needs continued PT services  PT Problem List Decreased strength;Decreased activity tolerance;Decreased balance;Decreased mobility;Decreased knowledge of use of DME;Decreased knowledge of precautions;Cardiopulmonary status limiting activity;Pain       PT Treatment Interventions DME instruction;Gait training;Stair training;Functional mobility training;Therapeutic activities;Balance training;Therapeutic exercise;Neuromuscular re-education;Patient/family education    PT Goals (Current goals can be found in the Care Plan section)  Acute Rehab PT Goals Patient Stated Goal: get  well PT Goal Formulation: With patient Time For Goal Achievement: 01/11/24 Potential to Achieve Goals: Good    Frequency Min 3X/week     Co-evaluation               AM-PAC PT 6 Clicks Mobility  Outcome Measure Help needed turning from your back to your side while in a flat bed without using bedrails?: A Little Help needed moving from lying on your back to sitting on the side of a flat bed without using bedrails?: A Little Help needed moving to and from a bed to a chair (including a wheelchair)?: A Lot Help needed standing up from a chair using your arms (e.g., wheelchair or bedside chair)?: A Lot Help needed to walk in hospital room?: A Lot Help needed climbing 3-5 steps with a railing? : Total 6 Click Score: 13    End of Session Equipment Utilized During Treatment: Gait belt;Oxygen Activity Tolerance: Patient limited by fatigue (hypotension) Patient left: in chair;with call bell/phone within reach;with nursing/sitter in room;with chair alarm set;with SCD's reapplied Nurse Communication: Mobility status PT Visit Diagnosis: Unsteadiness on feet (R26.81);Other abnormalities of gait and mobility (R26.89);Muscle weakness (generalized) (M62.81);Difficulty in walking, not elsewhere classified (R26.2);Pain Pain - part of body:  (neck)    Time: 9075-9043 PT Time Calculation (min) (ACUTE ONLY): 32 min   Charges:   PT Evaluation $PT Eval  Moderate Complexity: 1 Mod PT Treatments $Therapeutic Activity: 8-22 mins PT General Charges $$ ACUTE PT VISIT: 1 Visit         Leontine Roads, PT, DPT [facility]  Rehabilitation Services Physical Therapist Office: [phone] Website: [email or web address]   Leontine GORMAN Roads 12/28/2023, 10:32 AM

## 2023-12-28 NOTE — Progress Notes (Signed)
 Advanced Heart Failure Rounding Note  Cardiologist: Lurena MARLA Red, MD  Chief Complaint: S/P mTEER complicated by pericardial effusion Subjective:   -s/p pericardiocentesis 12/25/23. Repeat echo with no effusion - L/RHC 9/11: with equalization of filling pressures and severely reduced cardiac index by Fick, but no evidence of coronary disease> concern for potential tamponade. Chest CT with no evidence of swelling or effusion.  - TEE 9/11: No localized swelling or effusion. Required intubation 2/2 increased secretions and poor oxygenation.   Creatinine up to 4.5 this morning. Hemoglobin slightly down to 8.6, though suspect some dilutional. Minimal urine output yesterday. Having some numbness and tingling in her fingers and lips.   Objective:   Weight Range: 78.8 kg Body mass index is 27.62 kg/m.   Vital Signs:   Temp:  [97.5 F (36.4 C)-98.1 F (36.7 C)] 97.5 F (36.4 C) (09/13 0800) Pulse Rate:  [74-201] 81 (09/13 1000) Resp:  [11-34] 23 (09/13 1000) BP: (92-136)/(40-89) 135/56 (09/13 1000) SpO2:  [72 %-100 %] 94 % (09/13 1000) Arterial Line BP: (50-168)/(18-86) 143/47 (09/13 0900) Weight:  [78.8 kg] 78.8 kg (09/13 0750) Last BM Date : 12/27/23  Weight change: Filed Weights   12/26/23 0300 12/27/23 0500 12/28/23 0750  Weight: 72.2 kg 74 kg 78.8 kg   Intake/Output:   Intake/Output Summary (Last 24 hours) at 12/28/2023 1051 Last data filed at 12/28/2023 0800 Gross per 24 hour  Intake 1101.26 ml  Output 15 ml  Net 1086.26 ml    CVP 11 Physical Exam  General:  elderly appearing.   Neck: JVD ~15 cm. RIJ CVC, CVP 19 Cor: RRR, systolic murmur Lungs: clear Extremities: 1+ edema  Neuro: awake and alert, weak  Telemetry   NSR 70s (Personally reviewed)    Labs    CBC Recent Labs    12/27/23 0453 12/28/23 0550  WBC 9.5 9.3  HGB 9.8* 8.6*  HCT 30.1* 26.5*  MCV 94.4 95.3  PLT 158 74*   Basic Metabolic Panel Recent Labs    90/87/74 1436 12/28/23 0441   NA 136 133*  K 4.4 5.1  CL 102 97*  CO2 18* 19*  GLUCOSE 148* 108*  BUN 61* 74*  CREATININE 3.55* 4.45*  CALCIUM  7.1* 7.0*   Liver Function Tests Recent Labs    12/25/23 1359  AST 62*  ALT 38  ALKPHOS 34*  BILITOT 1.0  PROT 5.7*  ALBUMIN  3.2*   Medications:   Scheduled Medications:  arformoterol   15 mcg Nebulization BID   aspirin  EC  650 mg Oral TID   atorvastatin   10 mg Oral Once per day on Monday Wednesday Friday   Chlorhexidine  Gluconate Cloth  6 each Topical Daily   cholecalciferol   1,000 Units Oral Daily   clopidogrel   75 mg Oral Daily   colchicine   0.6 mg Oral Daily   escitalopram   5 mg Oral Daily   melatonin  1.5 mg Oral QHS   pantoprazole   40 mg Oral BID   polyethylene glycol  17 g Oral Daily   revefenacin   175 mcg Nebulization Daily   senna-docusate  2 tablet Oral QHS   sodium chloride  flush  3 mL Intravenous Q12H    Infusions:  sodium chloride      calcium  gluconate 2,000 mg (12/28/23 1033)   cefTRIAXone  (ROCEPHIN )  IV     DOBUTamine  5 mcg/kg/min (12/28/23 0800)   furosemide      norepinephrine  (LEVOPHED ) Adult infusion 2 mcg/min (12/28/23 0800)   vasopressin  0.03 Units/min (12/28/23 0800)  PRN Medications: sodium chloride , acetaminophen , ALPRAZolam , azelastine , famotidine , HYDROmorphone  (DILAUDID ) injection, methocarbamol , nitroGLYCERIN , mouth rinse, mouth rinse, prochlorperazine , sodium chloride  flush  Patient Profile  [name] is a 83 year old with history of mitral valve disease, carotid disease, breast cancer treated with resection, and 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 Admitted to ICU post Mitral Clip complicated by Pericardial Effusion and shock.  Assessment/Plan   S/P mTEER complicated by pericardial effusion>>RV failure>>cardiogenic shock - Pericardial Effusion post mitral clip. Emergent Pericardiocentesis with 350 cc of bloody output.  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  - Lactic acid 3.4 - L/RHC 9/11: with equalization of filling pressures and severely reduced cardiac index by Fick, but no evidence of coronary disease> concern for potential tamponade. TCTS notified, chest CT with no evidence of swelling or effusion. - TEE 9/11: No localized swelling or effusion - NE down to 2, DBA 5, vaso 0.04 - Bedside echo   Mitral Valve Disease, S/P MTEER , Clip x2.  See above.    CAD 11/22/23 DES RCA.  Continue DAPT (ASA/plavix )  AKI> ARF - Anuric yesterday, no indications for iHD - Baseline SCr ~.9 - Avoid hypotension - If worsening tomorrow may need to discussion temporary CRRT  Hyperkalemia - Resolved  Acute respiratory failure - Required intubation during TEE 2/2 increased secretions and poor oxygenation - Extubated today  CRITICAL CARE Performed by: Morene [name]  Total critical care time: 45 minutes  Critical care time was exclusive of separately billable procedures and treating other patients.  Critical care was necessary to treat or prevent imminent or life-threatening deterioration.  Critical care was time spent personally by me on the following activities: development of treatment plan with patient and/or surrogate as well as nursing, discussions with consultants, evaluation of patient's response to treatment, examination of patient, obtaining history from patient or surrogate, ordering and performing treatments and interventions, ordering and review of laboratory studies, ordering and review of radiographic studies, pulse oximetry and re-evaluation of patient's condition.   Length of Stay: 3  Morene [name], MD  12/28/2023, 10:51 AM  Advanced Heart Failure Team Pager [phone] (M-F; 7a - 5p)  Please contact [facility] for night-coverage after hours (5p -7a ) and weekends on [email or web address]

## 2023-12-28 NOTE — Progress Notes (Signed)

## 2023-12-28 NOTE — Plan of Care (Signed)
 Problem: Education: Goal: Knowledge of General Education information will improve Description: Including pain rating scale, medication(s)/side effects and non-pharmacologic comfort measures 12/28/2023 0506 by Rolfe Corean HERO, RN Outcome: Progressing 12/28/2023 0505 by Rolfe Corean HERO, RN Outcome: Progressing   Problem: Health Behavior/Discharge Planning: Goal: Ability to manage health-related needs will improve 12/28/2023 0506 by Rolfe Corean HERO, RN Outcome: Progressing 12/28/2023 0505 by Rolfe Corean HERO, RN Outcome: Progressing   Problem: Clinical Measurements: Goal: Ability to maintain clinical measurements within normal limits will improve 12/28/2023 0506 by Rolfe Corean HERO, RN Outcome: Progressing 12/28/2023 0505 by Rolfe Corean HERO, RN Outcome: Progressing Goal: Will remain free from infection 12/28/2023 0506 by Rolfe Corean HERO, RN Outcome: Progressing 12/28/2023 0505 by Rolfe Corean HERO, RN Outcome: Progressing Goal: Diagnostic test results will improve 12/28/2023 0506 by Rolfe Corean HERO, RN Outcome: Progressing 12/28/2023 0505 by Rolfe Corean HERO, RN Outcome: Progressing Goal: Respiratory complications will improve 12/28/2023 0506 by Rolfe Corean HERO, RN Outcome: Progressing 12/28/2023 0505 by Rolfe Corean HERO, RN Outcome: Progressing Goal: Cardiovascular complication will be avoided 12/28/2023 0506 by Rolfe Corean HERO, RN Outcome: Progressing 12/28/2023 0505 by Rolfe Corean HERO, RN Outcome: Progressing   Problem: Activity: Goal: Risk for activity intolerance will decrease 12/28/2023 0506 by Rolfe Corean HERO, RN Outcome: Progressing 12/28/2023 0505 by Rolfe Corean HERO, RN Outcome: Progressing   Problem: Nutrition: Goal: Adequate nutrition will be maintained 12/28/2023 0506 by Rolfe Corean HERO, RN Outcome: Progressing 12/28/2023 0505 by Rolfe Corean HERO,  RN Outcome: Progressing   Problem: Coping: Goal: Level of anxiety will decrease 12/28/2023 0506 by Rolfe Corean HERO, RN Outcome: Progressing 12/28/2023 0505 by Rolfe Corean HERO, RN Outcome: Progressing   Problem: Elimination: Goal: Will not experience complications related to bowel motility 12/28/2023 0506 by Rolfe Corean HERO, RN Outcome: Progressing 12/28/2023 0505 by Rolfe Corean HERO, RN Outcome: Progressing Goal: Will not experience complications related to urinary retention 12/28/2023 0506 by Rolfe Corean HERO, RN Outcome: Progressing 12/28/2023 0505 by Rolfe Corean HERO, RN Outcome: Progressing   Problem: Pain Managment: Goal: General experience of comfort will improve and/or be controlled 12/28/2023 0506 by Rolfe Corean HERO, RN Outcome: Progressing 12/28/2023 0505 by Rolfe Corean HERO, RN Outcome: Progressing   Problem: Safety: Goal: Ability to remain free from injury will improve 12/28/2023 0506 by Rolfe Corean HERO, RN Outcome: Progressing 12/28/2023 0505 by Rolfe Corean HERO, RN Outcome: Progressing   Problem: Skin Integrity: Goal: Risk for impaired skin integrity will decrease 12/28/2023 0506 by Rolfe Corean HERO, RN Outcome: Progressing 12/28/2023 0505 by Rolfe Corean HERO, RN Outcome: Progressing   Problem: Education: Goal: Understanding of CV disease, CV risk reduction, and recovery process will improve 12/28/2023 0506 by Rolfe Corean HERO, RN Outcome: Progressing 12/28/2023 0505 by Rolfe Corean HERO, RN Outcome: Progressing Goal: Individualized Educational Video(s) 12/28/2023 0506 by Rolfe Corean HERO, RN Outcome: Progressing 12/28/2023 0505 by Rolfe Corean HERO, RN Outcome: Progressing   Problem: Activity: Goal: Ability to return to baseline activity level will improve 12/28/2023 0506 by Rolfe Corean HERO, RN Outcome: Progressing 12/28/2023 0505 by Rolfe Corean HERO,  RN Outcome: Progressing   Problem: Cardiovascular: Goal: Ability to achieve and maintain adequate cardiovascular perfusion will improve 12/28/2023 0506 by Rolfe Corean HERO, RN Outcome: Progressing 12/28/2023 0505 by Rolfe Corean HERO, RN Outcome: Progressing Goal: Vascular access site(s) Level 0-1 will be maintained 12/28/2023 0506 by Rolfe Corean HERO, RN Outcome: Progressing 12/28/2023 0505 by Rolfe Corean HERO, RN Outcome: Progressing   Problem: Health Behavior/Discharge Planning: Goal: Ability to safely manage  health-related needs after discharge will improve 12/28/2023 0506 by Rolfe Corean HERO, RN Outcome: Progressing 12/28/2023 0505 by Rolfe Corean HERO, RN Outcome: Progressing   Problem: Activity: Goal: Ability to tolerate increased activity will improve 12/28/2023 0506 by Rolfe Corean HERO, RN Outcome: Progressing 12/28/2023 0505 by Rolfe Corean HERO, RN Outcome: Progressing   Problem: Respiratory: Goal: Ability to maintain a clear airway and adequate ventilation will improve 12/28/2023 0506 by Rolfe Corean HERO, RN Outcome: Progressing 12/28/2023 0505 by Rolfe Corean HERO, RN Outcome: Progressing   Problem: Role Relationship: Goal: Method of communication will improve 12/28/2023 0506 by Rolfe Corean HERO, RN Outcome: Progressing 12/28/2023 0505 by Rolfe Corean HERO, RN Outcome: Progressing

## 2023-12-28 NOTE — Progress Notes (Signed)
 NAME:  [name], MRN:  [id number], DOB:  [birth year], LOS: 3 ADMISSION DATE:  12/25/2023, CONSULTATION DATE:  9/10s REFERRING MD:  [email or web address], CHIEF COMPLAINT:  critical care services    History of Present Illness:  83 year old female w/ sig h/o HTN, CAD w/ prior DESx 3 w/ LVED of 27, severe MVR and mod to severe TR. NYHA glass II symptoms w/ fatigue, dizziness on occasion. She has been on DAPT. Last TEE EF 55-50% svr MR. Seen by cardiac surg in consultation on 9/5 and felt to be poor candidate for surgical repair. Presented to [facility] on 9/10 for svr MR where she underwent planned mitral clip.  Post procedure developed acute hypotension, SOB and CP. Treated w/ albumin , stat echo showed large pericardial effusion. Had emergent pericardicentesis where was removed and drain placed. Sbp Subsequently improved, but then hypotensive again.. Transferred to ICU post procedure. Got 1 liter NS for POCUS suggesting volume depletion. Started on norepinephrine , right internal jugular CVL was placed for co-ox and CVP monitoring. PCCM asked to assist w/ RX Pertinent  Medical History  CAD s/p PCI w/ DESx3 ostial-distal RCA on 8/8. Her LEVDP was 27 MVR (severe) w/ flail segment and mod to severe tricuspid regurg.  Baseline NYHA class II HLD Bilateral Carotid artery stenosis HTN Lung cancer  Significant Hospital Events: Including procedures, antibiotic start and stop dates in addition to other pertinent events   9/9 admitted for mitral clipping. Developed acute onset of SOB, CP and hypotension post procedure. Had lg pericardial effusion. 350 ml drawn off and drain placed. Initially responded but subsequently developed hypotension. Started on NE. CVL placed. PCCM consulted to assist w care. On arrival reports severe chest and substernal chest pain. No sig output from drain since placement  9/11: intubated for TEE, RHC, angio  9/12: extubated   Interim History / Subjective:  This morning complains of numbness  in her chin since last night. Per RN multiple normal NIHSS.  She still has some ongoing nausea. Poor PO intake.  Objective    Blood pressure (!) 135/56, pulse 81, temperature (!) 97.5 F (36.4 C), temperature source Axillary, resp. rate (!) 23, height 5' 6.5 (1.689 m), weight 78.8 kg, SpO2 94%. CVP:  [3 mmHg-37 mmHg] 18 mmHg      Intake/Output Summary (Last 24 hours) at 12/28/2023 1013 Last data filed at 12/28/2023 0800 Gross per 24 hour  Intake 1101.26 ml  Output 15 ml  Net 1086.26 ml   Filed Weights   12/26/23 0300 12/27/23 0500 12/28/23 0750  Weight: 72.2 kg 74 kg 78.8 kg    Examination: General: elderly woman sitting up in bed, lethargic HENT [place]/AT, eyes anicteric, no drooling Lungs: breathing comfortably on [place], reduced basilar breath sounds Cardiovascular: S1S2, RRR, LSB systolic murmur Abdomen: soft, NT Extremities: no cyanosis, minimal edema Neuro: arouses to voice, moving all extremities, globally weak.  UOP 15 LA 3,4 Na+ 133 Bicarb 19BUN 74 Cr 4.45 Ca+ 7.0 WBC 9.3 H/H 8.6/26.5 Platelets 74   Resolved problem list   Assessment and Plan  Shock; likely obstructive in setting of tamponade vs cardiogenic  hemorrhagic pericardial effusion, pericarditis  Acute on chronic RV failure  S/p pericardiocentesis 9/10 with 400cc blood removed; had [facility] 9/11 demonstrating equalization of filling pressures and severe reduced cardiac index to 1.8 by Fick. Severe TR. No obstructive CAD, stent patent. Lactate has been labile. - appreciate AHF's management; serial lactate and coox. Con't NE, DBA. -diuresis per AHF; worry she may need CRRT  eventually -tele monitoring  Severe MR s/p mTEER with good response CAD  HLD TEE with adequate placement of clip. Minimal MR, mild RV dysfunction. Severe TR -con't high dose aspirin ; long term will go back to lower dose -con't plavix  for recent stent -statin -B-blocker on hold due to shock  Acute hypoxic respiratory failure;  improved Concern for aspiration pneumonia vs atelectasis with infiltrates seen on CT COPD -pulmonary hygiene -deescalate antibiotics to ceftriaxone  -brovana  & yupelri  -pulmonary hygiene  Face numbness-- potentially due to hypocalcemia -2g Ca+ -if not improving after calcium  repletion, STAT head CT  Elevated blood glucose likely due to decadron  in cath lab/ anesthesia; now borderline low BG with minimal insulin  requirements -d/c SSI with low BG  Acute kidney injury due to ATN from shock Hyperkalemia, resolved  Hypervolemic hyponatremia -maintain adequate perfusion -renally dose meds, avoid nephrotoxic meds -strict I/O -renal US  to r/o hydro> very unlikely in her case  H/o lung cancer in remote past with RML wedge resection -OP follow up as previously scheduled  Thrombocytopenia- no clear explanation for this. Not on heparinoids.  -recheck CBC  ABLA from pericardial effusion, now dilution -transfuse for Hb<7 or hemodynamically significant bleeding -monitor; recking CBC for platelets  Nausea -con't compazine   Deconditioning -PT, OT   Labs   CBC: Recent Labs  Lab 12/26/23 0700 12/26/23 0752 12/26/23 1103 12/26/23 1317 12/26/23 1554 12/27/23 0448 12/27/23 0453 12/28/23 0550  WBC 13.3* 13.7*  --  15.3*  --   --  9.5 9.3  HGB 10.9* 11.0*   < > 10.4* 9.2* 9.9* 9.8* 8.6*  HCT 35.5* 35.3*   < > 32.9* 27.0* 29.0* 30.1* 26.5*  MCV 98.6 99.7  --  98.8  --   --  94.4 95.3  PLT 300 275  --  241  --   --  158 74*   < > = values in this interval not displayed.    Basic Metabolic Panel: Recent Labs  Lab 12/26/23 0752 12/26/23 1103 12/26/23 1317 12/26/23 1554 12/27/23 0448 12/27/23 0453 12/27/23 1436 12/28/23 0441  NA 136   < > 136 145 139 138 136 133*  K 5.8*   < > 4.5 4.0 4.3 4.2 4.4 5.1  CL 103  --  103  --   --  104 102 97*  CO2 15*  --  19*  --   --  19* 18* 19*  GLUCOSE 274*  --  247*  --   --  220* 148* 108*  BUN 39*  --  44*  --   --  54* 61* 74*   CREATININE 2.14*  --  2.51*  --   --  3.06* 3.55* 4.45*  CALCIUM  7.0*  --  7.1*  --   --  7.2* 7.1* 7.0*   < > = values in this interval not displayed.   GFR: Estimated Creatinine Clearance: 10.4 mL/min (A) (by C-G formula based on SCr of 4.45 mg/dL (H)). Recent Labs  Lab 12/26/23 0752 12/26/23 0926 12/26/23 1317 12/26/23 1340 12/27/23 0453 12/27/23 0656 12/27/23 1652 12/27/23 1703 12/27/23 1944 12/28/23 0550 12/28/23 0844  WBC 13.7*  --  15.3*  --  9.5  --   --   --   --  9.3  --   LATICACIDVEN  --    < >  --    < >  --    < > 3.0* 2.8* 2.6*  --  3.4*   < > = values in this interval not displayed.  This patient is critically ill with multiple organ system failure which requires frequent high complexity decision making, assessment, support, evaluation, and titration of therapies. This was completed through the application of advanced monitoring technologies and extensive interpretation of multiple databases. During this encounter critical care time was devoted to patient care services described in this note for 37 minutes.  Leita SHAUNNA Gaskins, DO 12/28/23 10:40 AM [facility]  For contact information, see Amion. If no response to pager, please call PCCM consult pager. After hours, 7PM- 7AM, please call Elink.

## 2023-12-29 ENCOUNTER — Inpatient Hospital Stay (HOSPITAL_COMMUNITY)

## 2023-12-29 DIAGNOSIS — Z4682 Encounter for fitting and adjustment of non-vascular catheter: Secondary | ICD-10-CM | POA: Diagnosis not present

## 2023-12-29 DIAGNOSIS — I3139 Other pericardial effusion (noninflammatory): Secondary | ICD-10-CM | POA: Diagnosis not present

## 2023-12-29 DIAGNOSIS — J9811 Atelectasis: Secondary | ICD-10-CM | POA: Diagnosis not present

## 2023-12-29 DIAGNOSIS — J9601 Acute respiratory failure with hypoxia: Secondary | ICD-10-CM

## 2023-12-29 DIAGNOSIS — Z9889 Other specified postprocedural states: Secondary | ICD-10-CM | POA: Diagnosis not present

## 2023-12-29 DIAGNOSIS — Z95818 Presence of other cardiac implants and grafts: Secondary | ICD-10-CM | POA: Diagnosis not present

## 2023-12-29 DIAGNOSIS — Z452 Encounter for adjustment and management of vascular access device: Secondary | ICD-10-CM | POA: Diagnosis not present

## 2023-12-29 DIAGNOSIS — J90 Pleural effusion, not elsewhere classified: Secondary | ICD-10-CM | POA: Diagnosis not present

## 2023-12-29 DIAGNOSIS — R570 Cardiogenic shock: Secondary | ICD-10-CM | POA: Diagnosis not present

## 2023-12-29 LAB — POCT I-STAT 7, (LYTES, BLD GAS, ICA,H+H)
Acid-base deficit: 13 mmol/L — ABNORMAL HIGH (ref 0.0–2.0)
Acid-base deficit: 10 mmol/L — ABNORMAL HIGH (ref 0.0–2.0)
Acid-base deficit: 7 mmol/L — ABNORMAL HIGH (ref 0.0–2.0)
Acid-base deficit: 11 mmol/L — ABNORMAL HIGH (ref 0.0–2.0)
Bicarbonate: 13.2 mmol/L — ABNORMAL LOW (ref 20.0–28.0)
Bicarbonate: 15.9 mmol/L — ABNORMAL LOW (ref 20.0–28.0)
Bicarbonate: 16.5 mmol/L — ABNORMAL LOW (ref 20.0–28.0)
Bicarbonate: 12.3 mmol/L — ABNORMAL LOW (ref 20.0–28.0)
Calcium, Ion: 0.93 mmol/L — ABNORMAL LOW (ref 1.15–1.40)
Calcium, Ion: 1.46 mmol/L — ABNORMAL HIGH (ref 1.15–1.40)
Calcium, Ion: 1.06 mmol/L — ABNORMAL LOW (ref 1.15–1.40)
Calcium, Ion: 0.93 mmol/L — ABNORMAL LOW (ref 1.15–1.40)
HCT: 23 % — ABNORMAL LOW (ref 36.0–46.0)
HCT: 20 % — ABNORMAL LOW (ref 36.0–46.0)
HCT: 23 % — ABNORMAL LOW (ref 36.0–46.0)
HCT: 35 % — ABNORMAL LOW (ref 36.0–46.0)
Hemoglobin: 7.8 g/dL — ABNORMAL LOW (ref 12.0–15.0)
Hemoglobin: 6.8 g/dL — CL (ref 12.0–15.0)
Hemoglobin: 7.8 g/dL — ABNORMAL LOW (ref 12.0–15.0)
Hemoglobin: 11.9 g/dL — ABNORMAL LOW (ref 12.0–15.0)
O2 Saturation: 100 %
O2 Saturation: 100 %
O2 Saturation: 100 %
O2 Saturation: 100 %
Patient temperature: 36.4
Patient temperature: 96
Patient temperature: 34.9
Patient temperature: 36.6
Potassium: 6.3 mmol/L (ref 3.5–5.1)
Potassium: 5.6 mmol/L — ABNORMAL HIGH (ref 3.5–5.1)
Potassium: 5 mmol/L (ref 3.5–5.1)
Potassium: 5.2 mmol/L — ABNORMAL HIGH (ref 3.5–5.1)
Sodium: 130 mmol/L — ABNORMAL LOW (ref 135–145)
Sodium: 133 mmol/L — ABNORMAL LOW (ref 135–145)
Sodium: 135 mmol/L (ref 135–145)
Sodium: 135 mmol/L (ref 135–145)
TCO2: 14 mmol/L — ABNORMAL LOW (ref 22–32)
TCO2: 17 mmol/L — ABNORMAL LOW (ref 22–32)
TCO2: 17 mmol/L — ABNORMAL LOW (ref 22–32)
TCO2: 13 mmol/L — ABNORMAL LOW (ref 22–32)
pCO2 arterial: 29.9 mmHg — ABNORMAL LOW (ref 32–48)
pCO2 arterial: 32.1 mmHg (ref 32–48)
pCO2 arterial: 23.1 mmHg — ABNORMAL LOW (ref 32–48)
pCO2 arterial: 21.1 mmHg — ABNORMAL LOW (ref 32–48)
pH, Arterial: 7.251 — ABNORMAL LOW (ref 7.35–7.45)
pH, Arterial: 7.295 — ABNORMAL LOW (ref 7.35–7.45)
pH, Arterial: 7.452 — ABNORMAL HIGH (ref 7.35–7.45)
pH, Arterial: 7.371 (ref 7.35–7.45)
pO2, Arterial: 256 mmHg — ABNORMAL HIGH (ref 83–108)
pO2, Arterial: 260 mmHg — ABNORMAL HIGH (ref 83–108)
pO2, Arterial: 260 mmHg — ABNORMAL HIGH (ref 83–108)
pO2, Arterial: 221 mmHg — ABNORMAL HIGH (ref 83–108)

## 2023-12-29 LAB — GLUCOSE, CAPILLARY
Glucose-Capillary: 37 mg/dL — CL (ref 70–99)
Glucose-Capillary: 174 mg/dL — ABNORMAL HIGH (ref 70–99)
Glucose-Capillary: 222 mg/dL — ABNORMAL HIGH (ref 70–99)
Glucose-Capillary: 143 mg/dL — ABNORMAL HIGH (ref 70–99)
Glucose-Capillary: 81 mg/dL (ref 70–99)
Glucose-Capillary: 63 mg/dL — ABNORMAL LOW (ref 70–99)
Glucose-Capillary: 131 mg/dL — ABNORMAL HIGH (ref 70–99)

## 2023-12-29 LAB — COOXEMETRY PANEL
Carboxyhemoglobin: 1.2 % (ref 0.5–1.5)
Carboxyhemoglobin: 1.3 % (ref 0.5–1.5)
Carboxyhemoglobin: 0.9 % (ref 0.5–1.5)
Methemoglobin: <0.7 % (ref 0.0–1.5)
Methemoglobin: 0.7 % (ref 0.0–1.5)
Methemoglobin: <0.7 % (ref 0.0–1.5)
O2 Saturation: 89.1 %
O2 Saturation: 80.5 %
O2 Saturation: 68.7 %
Total hemoglobin: 7.8 g/dL — ABNORMAL LOW (ref 12.0–16.0)
Total hemoglobin: 8.3 g/dL — ABNORMAL LOW (ref 12.0–16.0)
Total hemoglobin: 8.5 g/dL — ABNORMAL LOW (ref 12.0–16.0)

## 2023-12-29 LAB — DIFFERENTIAL
Abs Immature Granulocytes: 0.18 K/uL — ABNORMAL HIGH (ref 0.00–0.07)
Basophils Absolute: 0 K/uL (ref 0.0–0.1)
Basophils Relative: 0 %
Eosinophils Absolute: 0 K/uL (ref 0.0–0.5)
Eosinophils Relative: 0 %
Immature Granulocytes: 1 %
Lymphocytes Relative: 5 %
Lymphs Abs: 0.7 K/uL (ref 0.7–4.0)
Monocytes Absolute: 0.5 K/uL (ref 0.1–1.0)
Monocytes Relative: 4 %
Neutro Abs: 11.7 K/uL — ABNORMAL HIGH (ref 1.7–7.7)
Neutrophils Relative %: 90 %
Smear Review: DECREASED

## 2023-12-29 LAB — RENAL FUNCTION PANEL
Albumin: 3 g/dL — ABNORMAL LOW (ref 3.5–5.0)
Albumin: 2.6 g/dL — ABNORMAL LOW (ref 3.5–5.0)
Anion gap: 28 — ABNORMAL HIGH (ref 5–15)
Anion gap: 27 — ABNORMAL HIGH (ref 5–15)
BUN: 87 mg/dL — ABNORMAL HIGH (ref 8–23)
BUN: 68 mg/dL — ABNORMAL HIGH (ref 8–23)
CO2: 12 mmol/L — ABNORMAL LOW (ref 22–32)
CO2: 13 mmol/L — ABNORMAL LOW (ref 22–32)
Calcium: 7.1 mg/dL — ABNORMAL LOW (ref 8.9–10.3)
Calcium: 9.1 mg/dL (ref 8.9–10.3)
Chloride: 95 mmol/L — ABNORMAL LOW (ref 98–111)
Chloride: 94 mmol/L — ABNORMAL LOW (ref 98–111)
Creatinine, Ser: 5.54 mg/dL — ABNORMAL HIGH (ref 0.44–1.00)
Creatinine, Ser: 4.33 mg/dL — ABNORMAL HIGH (ref 0.44–1.00)
GFR, Estimated: 7 mL/min — ABNORMAL LOW (ref >60)
GFR, Estimated: 10 mL/min — ABNORMAL LOW (ref >60)
Glucose, Bld: 57 mg/dL — ABNORMAL LOW (ref 70–99)
Glucose, Bld: 149 mg/dL — ABNORMAL HIGH (ref 70–99)
Phosphorus: 10.9 mg/dL — ABNORMAL HIGH (ref 2.5–4.6)
Phosphorus: 7.4 mg/dL — ABNORMAL HIGH (ref 2.5–4.6)
Potassium: 6.5 mmol/L (ref 3.5–5.1)
Potassium: 5 mmol/L (ref 3.5–5.1)
Sodium: 135 mmol/L (ref 135–145)
Sodium: 134 mmol/L — ABNORMAL LOW (ref 135–145)

## 2023-12-29 LAB — CBC
HCT: 25.9 % — ABNORMAL LOW (ref 36.0–46.0)
HCT: 25.3 % — ABNORMAL LOW (ref 36.0–46.0)
HCT: 21.6 % — ABNORMAL LOW (ref 36.0–46.0)
Hemoglobin: 8.3 g/dL — ABNORMAL LOW (ref 12.0–15.0)
Hemoglobin: 8.1 g/dL — ABNORMAL LOW (ref 12.0–15.0)
Hemoglobin: 7.1 g/dL — ABNORMAL LOW (ref 12.0–15.0)
MCH: 30.9 pg (ref 26.0–34.0)
MCH: 31.3 pg (ref 26.0–34.0)
MCH: 31.3 pg (ref 26.0–34.0)
MCHC: 32 g/dL (ref 30.0–36.0)
MCHC: 32 g/dL (ref 30.0–36.0)
MCHC: 32.9 g/dL (ref 30.0–36.0)
MCV: 96.3 fL (ref 80.0–100.0)
MCV: 97.7 fL (ref 80.0–100.0)
MCV: 95.2 fL (ref 80.0–100.0)
Platelets: 57 K/uL — ABNORMAL LOW (ref 150–400)
Platelets: 50 K/uL — ABNORMAL LOW (ref 150–400)
Platelets: 36 K/uL — ABNORMAL LOW (ref 150–400)
RBC: 2.69 MIL/uL — ABNORMAL LOW (ref 3.87–5.11)
RBC: 2.59 MIL/uL — ABNORMAL LOW (ref 3.87–5.11)
RBC: 2.27 MIL/uL — ABNORMAL LOW (ref 3.87–5.11)
RDW: 13.7 % (ref 11.5–15.5)
RDW: 13.7 % (ref 11.5–15.5)
RDW: 13.6 % (ref 11.5–15.5)
WBC: 12.1 K/uL — ABNORMAL HIGH (ref 4.0–10.5)
WBC: 12.3 K/uL — ABNORMAL HIGH (ref 4.0–10.5)
WBC: 13 K/uL — ABNORMAL HIGH (ref 4.0–10.5)
nRBC: 3.5 % — ABNORMAL HIGH (ref 0.0–0.2)
nRBC: 4.5 % — ABNORMAL HIGH (ref 0.0–0.2)
nRBC: 7.8 % — ABNORMAL HIGH (ref 0.0–0.2)

## 2023-12-29 LAB — HEPATIC FUNCTION PANEL
ALT: 3843 U/L — ABNORMAL HIGH (ref 0–44)
AST: >7000 U/L — ABNORMAL HIGH (ref 15–41)
Albumin: 2.7 g/dL — ABNORMAL LOW (ref 3.5–5.0)
Alkaline Phosphatase: 59 U/L (ref 38–126)
Bilirubin, Direct: 1 mg/dL — ABNORMAL HIGH (ref 0.0–0.2)
Indirect Bilirubin: 1.2 mg/dL — ABNORMAL HIGH (ref 0.3–0.9)
Total Bilirubin: 2.2 mg/dL — ABNORMAL HIGH (ref 0.0–1.2)
Total Protein: 5 g/dL — ABNORMAL LOW (ref 6.5–8.1)

## 2023-12-29 LAB — BASIC METABOLIC PANEL WITH GFR
Anion gap: 25 — ABNORMAL HIGH (ref 5–15)
BUN: 88 mg/dL — ABNORMAL HIGH (ref 8–23)
CO2: 13 mmol/L — ABNORMAL LOW (ref 22–32)
Calcium: 8 mg/dL — ABNORMAL LOW (ref 8.9–10.3)
Chloride: 95 mmol/L — ABNORMAL LOW (ref 98–111)
Creatinine, Ser: 5.47 mg/dL — ABNORMAL HIGH (ref 0.44–1.00)
GFR, Estimated: 7 mL/min — ABNORMAL LOW (ref >60)
Glucose, Bld: 249 mg/dL — ABNORMAL HIGH (ref 70–99)
Potassium: 6.4 mmol/L (ref 3.5–5.1)
Sodium: 133 mmol/L — ABNORMAL LOW (ref 135–145)

## 2023-12-29 LAB — DIC (DISSEMINATED INTRAVASCULAR COAGULATION)PANEL
D-Dimer, Quant: >20 ug{FEU}/mL — ABNORMAL HIGH (ref 0.00–0.50)
Fibrinogen: 443 mg/dL (ref 210–475)
INR: 8.8 (ref 0.8–1.2)
Platelets: 36 K/uL — ABNORMAL LOW (ref 150–400)
Prothrombin Time: 75.2 s — ABNORMAL HIGH (ref 11.4–15.2)
Smear Review: NONE SEEN
aPTT: 50 s — ABNORMAL HIGH (ref 24–36)

## 2023-12-29 LAB — PREPARE RBC (CROSSMATCH)

## 2023-12-29 LAB — MAGNESIUM: Magnesium: 2.6 mg/dL — ABNORMAL HIGH (ref 1.7–2.4)

## 2023-12-29 LAB — LACTIC ACID, PLASMA: Lactic Acid, Venous: >9 mmol/L (ref 0.5–1.9)

## 2023-12-29 LAB — PHOSPHORUS: Phosphorus: 10.9 mg/dL — ABNORMAL HIGH (ref 2.5–4.6)

## 2023-12-29 MED ORDER — CALCIUM GLUCONATE-NACL 1-0.675 GM/50ML-% IV SOLN
1.0000 g | Freq: Once | INTRAVENOUS | Status: AC
  Administered 2023-12-29: 1000 mg via INTRAVENOUS
  Filled 2023-12-29: qty 50

## 2023-12-29 MED ORDER — SODIUM CHLORIDE 0.9% IV SOLUTION
Freq: Once | INTRAVENOUS | Status: AC
Start: 2023-12-29 — End: 2023-12-29

## 2023-12-29 MED ORDER — CALCIUM CHLORIDE 10 % IV SOLN: 1.0000 g | Freq: Once | INTRAVENOUS | Status: AC

## 2023-12-29 MED ORDER — FENTANYL CITRATE PF 50 MCG/ML IJ SOSY: 50.0000 ug | PREFILLED_SYRINGE | Freq: Once | INTRAMUSCULAR | Status: AC

## 2023-12-29 MED ORDER — CALCIUM CHLORIDE 10 % IV SOLN
1.0000 g | Freq: Once | INTRAVENOUS | Status: AC
  Administered 2023-12-29: 1 g via INTRAVENOUS

## 2023-12-29 MED ORDER — SODIUM BICARBONATE 8.4 % IV SOLN
100.0000 meq | Freq: Once | INTRAVENOUS | Status: AC
Start: 2023-12-29 — End: 2023-12-29

## 2023-12-29 MED ORDER — HEPARIN SODIUM (PORCINE) 1000 UNIT/ML IJ SOLN
INTRAMUSCULAR | Status: AC
  Filled 2023-12-29: qty 3

## 2023-12-29 MED ORDER — DEXTROSE 50 % IV SOLN: 1.0000 | Freq: Once | INTRAVENOUS | Status: AC

## 2023-12-29 MED ORDER — ETOMIDATE 2 MG/ML IV SOLN: 20.0000 mg | Freq: Once | INTRAVENOUS | Status: AC

## 2023-12-29 MED ORDER — SODIUM BICARBONATE 8.4 % IV SOLN
50.0000 meq | Freq: Once | INTRAVENOUS | Status: AC
  Administered 2023-12-29: 50 meq via INTRAVENOUS
  Filled 2023-12-29: qty 50

## 2023-12-29 MED ORDER — CALCIUM CHLORIDE 10 % IV SOLN
INTRAVENOUS | Status: AC
  Administered 2023-12-29: 1 g via INTRAVENOUS
  Filled 2023-12-29: qty 10

## 2023-12-29 MED ORDER — FENTANYL CITRATE PF 50 MCG/ML IJ SOSY: 50.0000 ug | PREFILLED_SYRINGE | INTRAMUSCULAR | Status: DC | PRN

## 2023-12-29 MED ORDER — MIDAZOLAM HCL 2 MG/2ML IJ SOLN
INTRAMUSCULAR | Status: AC
  Administered 2023-12-29: 2 mg via INTRAVENOUS
  Filled 2023-12-29: qty 2

## 2023-12-29 MED ORDER — SODIUM BICARBONATE 8.4 % IV SOLN: 100.0000 meq | Freq: Once | INTRAVENOUS | Status: AC

## 2023-12-29 MED ORDER — VITAMIN K1 10 MG/ML IJ SOLN
5.0000 mg | Freq: Once | INTRAVENOUS | Status: AC
  Administered 2023-12-29: 5 mg via INTRAVENOUS
  Filled 2023-12-29: qty 0.5

## 2023-12-29 MED ORDER — FENTANYL CITRATE PF 50 MCG/ML IJ SOSY
PREFILLED_SYRINGE | INTRAMUSCULAR | Status: AC
  Administered 2023-12-29: 50 ug via INTRAVENOUS
  Filled 2023-12-29: qty 2

## 2023-12-29 MED ORDER — PROPOFOL 1000 MG/100ML IV EMUL
0.0000 ug/kg/min | INTRAVENOUS | Status: DC
  Administered 2023-12-29: 25 ug/kg/min via INTRAVENOUS
  Administered 2023-12-29: 10 ug/kg/min via INTRAVENOUS
  Administered 2023-12-30: 20 ug/kg/min via INTRAVENOUS
  Filled 2023-12-29 (×4): qty 100

## 2023-12-29 MED ORDER — DEXTROSE 50 % IV SOLN
INTRAVENOUS | Status: AC
  Administered 2023-12-29: 50 mL via INTRAVENOUS
  Filled 2023-12-29: qty 50

## 2023-12-29 MED ORDER — ACETYLCYSTEINE LOAD VIA INFUSION
150.0000 mg/kg | Freq: Once | INTRAVENOUS | Status: AC
  Administered 2023-12-29: 11820 mg via INTRAVENOUS
  Filled 2023-12-29: qty 388

## 2023-12-29 MED ORDER — DEXTROSE 5 % IV SOLN
12.5000 mg/kg/h | INTRAVENOUS | Status: AC
  Administered 2023-12-29: 12.5 mg/kg/h via INTRAVENOUS
  Filled 2023-12-29 (×2): qty 90

## 2023-12-29 MED ORDER — SODIUM BICARBONATE 8.4 % IV SOLN: 50.0000 meq | Freq: Once | INTRAVENOUS | Status: AC

## 2023-12-29 MED ORDER — SODIUM BICARBONATE 8.4 % IV SOLN
INTRAVENOUS | Status: AC
  Administered 2023-12-29: 50 meq via INTRAVENOUS
  Filled 2023-12-29: qty 150

## 2023-12-29 MED ORDER — SODIUM BICARBONATE 8.4 % IV SOLN
INTRAVENOUS | Status: AC
  Administered 2023-12-29: 50 meq via INTRAVENOUS
  Filled 2023-12-29: qty 50

## 2023-12-29 MED ORDER — CALCIUM GLUCONATE-NACL 1-0.675 GM/50ML-% IV SOLN: 1.0000 g | Freq: Once | INTRAVENOUS | Status: AC

## 2023-12-29 MED ORDER — CALCIUM CHLORIDE 10 % IV SOLN: 2.0000 g | Freq: Once | INTRAVENOUS | Status: AC

## 2023-12-29 MED ORDER — SODIUM BICARBONATE 8.4 % IV SOLN
5.0000 mL | Freq: Once | INTRAVENOUS | Status: DC
Start: 2023-12-29 — End: 2023-12-29

## 2023-12-29 MED ORDER — CALCIUM GLUCONATE-NACL 1-0.675 GM/50ML-% IV SOLN
INTRAVENOUS | Status: AC
  Administered 2023-12-29: 1000 mg via INTRAVENOUS
  Filled 2023-12-29: qty 50

## 2023-12-29 MED ORDER — ETOMIDATE 2 MG/ML IV SOLN
INTRAVENOUS | Status: AC
  Administered 2023-12-29: 20 mg via INTRAVENOUS
  Filled 2023-12-29: qty 20

## 2023-12-29 MED ORDER — DEXTROSE 50 % IV SOLN
50.0000 mL | Freq: Once | INTRAVENOUS | Status: AC
Start: 2023-12-29 — End: 2023-12-29
  Administered 2023-12-29: 50 mL via INTRAVENOUS
  Filled 2023-12-29: qty 50

## 2023-12-29 MED ORDER — SODIUM ZIRCONIUM CYCLOSILICATE 10 G PO PACK
10.0000 g | PACK | Freq: Once | ORAL | Status: AC
  Administered 2023-12-29: 10 g
  Filled 2023-12-29: qty 1

## 2023-12-29 MED ORDER — MIDAZOLAM HCL 2 MG/2ML IJ SOLN
2.0000 mg | Freq: Once | INTRAMUSCULAR | Status: AC
Start: 2023-12-29 — End: 2023-12-29

## 2023-12-29 MED ORDER — SODIUM CHLORIDE 0.45 % IV SOLN
INTRAVENOUS | Status: DC
  Filled 2023-12-29: qty 75

## 2023-12-29 MED ORDER — SODIUM CHLORIDE 0.9% IV SOLUTION: Freq: Once | INTRAVENOUS | Status: AC

## 2023-12-29 MED ORDER — DEXTROSE 50 % IV SOLN
12.5000 g | INTRAVENOUS | Status: AC
  Administered 2023-12-29: 12.5 g via INTRAVENOUS
  Filled 2023-12-29: qty 50

## 2023-12-29 MED ORDER — ROCURONIUM BROMIDE 10 MG/ML (PF) SYRINGE
PREFILLED_SYRINGE | INTRAVENOUS | Status: AC
  Filled 2023-12-29: qty 10

## 2023-12-29 MED ORDER — SODIUM CHLORIDE 0.9 % IV SOLN
4.0000 g | Freq: Once | INTRAVENOUS | Status: AC
  Administered 2023-12-29: 4 g via INTRAVENOUS
  Filled 2023-12-29: qty 40

## 2023-12-29 MED ORDER — NOREPINEPHRINE 16 MG/250ML-% IV SOLN: 0.0000 ug/min | INTRAVENOUS | Status: DC

## 2023-12-29 MED ORDER — HYDROCORTISONE SOD SUC (PF) 100 MG IJ SOLR
100.0000 mg | Freq: Two times a day (BID) | INTRAMUSCULAR | Status: DC
  Administered 2023-12-29 – 2023-12-30 (×2): 100 mg via INTRAVENOUS
  Filled 2023-12-29 (×2): qty 2

## 2023-12-29 MED ORDER — PRISMASOL BGK 2/3.5 32-2-3.5 MEQ/L EC SOLN: Status: DC

## 2023-12-29 MED ORDER — ORAL CARE MOUTH RINSE
15.0000 mL | OROMUCOSAL | Status: DC
  Administered 2023-12-29 – 2023-12-30 (×17): 15 mL via OROMUCOSAL

## 2023-12-29 MED ORDER — ROCURONIUM BROMIDE 10 MG/ML (PF) SYRINGE: 70.0000 mg | PREFILLED_SYRINGE | Freq: Once | INTRAVENOUS | Status: DC

## 2023-12-29 MED ORDER — DEXTROSE 5 % IV SOLN
6.2500 mg/kg/h | INTRAVENOUS | Status: DC
  Filled 2023-12-29: qty 90

## 2023-12-29 MED ORDER — CALCIUM CHLORIDE 10 % IV SOLN
INTRAVENOUS | Status: AC
  Administered 2023-12-29: 2 g via INTRAVENOUS
  Filled 2023-12-29: qty 10

## 2023-12-29 MED ORDER — DEXTROSE 50 % IV SOLN
50.0000 mL | Freq: Once | INTRAVENOUS | Status: AC
  Administered 2023-12-29: 50 mL via INTRAVENOUS

## 2023-12-29 MED ORDER — HEPARIN SODIUM (PORCINE) 1000 UNIT/ML DIALYSIS: 1000.0000 [IU] | INTRAMUSCULAR | Status: DC | PRN

## 2023-12-29 MED ORDER — PHENYLEPHRINE 80 MCG/ML (10ML) SYRINGE FOR IV PUSH (FOR BLOOD PRESSURE SUPPORT)
PREFILLED_SYRINGE | INTRAVENOUS | Status: AC
  Filled 2023-12-29: qty 10

## 2023-12-29 MED ORDER — SODIUM BICARBONATE 8.4 % IV SOLN
INTRAVENOUS | Status: AC
  Administered 2023-12-29: 100 meq via INTRAVENOUS
  Filled 2023-12-29: qty 100

## 2023-12-29 MED ORDER — EPINEPHRINE HCL 5 MG/250ML IV SOLN IN NS
0.5000 ug/min | INTRAVENOUS | Status: DC
  Administered 2023-12-29: 0.5 ug/min via INTRAVENOUS
  Filled 2023-12-29: qty 250

## 2023-12-29 MED ORDER — NOREPINEPHRINE 4 MG/250ML-% IV SOLN
0.0000 ug/min | INTRAVENOUS | Status: DC
  Administered 2023-12-29: 2 ug/min via INTRAVENOUS
  Administered 2023-12-29: 8 ug/min via INTRAVENOUS
  Administered 2023-12-30: 15 ug/min via INTRAVENOUS
  Filled 2023-12-29 (×2): qty 250

## 2023-12-29 MED ORDER — INSULIN ASPART 100 UNIT/ML IJ SOLN
10.0000 [IU] | Freq: Once | INTRAMUSCULAR | Status: AC
  Administered 2023-12-29: 10 [IU] via SUBCUTANEOUS

## 2023-12-29 MED ORDER — SODIUM ZIRCONIUM CYCLOSILICATE 10 G PO PACK: 10.0000 g | PACK | Freq: Once | ORAL | Status: DC

## 2023-12-29 MED ORDER — MIDAZOLAM HCL 2 MG/2ML IJ SOLN: 1.0000 mg | INTRAMUSCULAR | Status: DC | PRN

## 2023-12-29 MED ORDER — IPRATROPIUM-ALBUTEROL 0.5-2.5 (3) MG/3ML IN SOLN
RESPIRATORY_TRACT | Status: AC
  Administered 2023-12-29: 3 mL
  Filled 2023-12-29: qty 3

## 2023-12-29 MED ORDER — IPRATROPIUM-ALBUTEROL 0.5-2.5 (3) MG/3ML IN SOLN
3.0000 mL | RESPIRATORY_TRACT | Status: DC | PRN
  Administered 2023-12-29: 3 mL via RESPIRATORY_TRACT

## 2023-12-29 MED ORDER — SODIUM BICARBONATE 8.4 % IV SOLN
INTRAVENOUS | Status: AC
  Administered 2023-12-29: 100 meq via INTRAVENOUS
  Filled 2023-12-29: qty 200

## 2023-12-29 NOTE — Progress Notes (Signed)
 Advanced Heart Failure Rounding Note  Cardiologist: Melinda MARLA Red, MD  Chief Complaint: S/P mTEER complicated by pericardial effusion Subjective:   -s/p pericardiocentesis 12/25/23. Repeat echo with no effusion - L/RHC 9/11: with equalization of filling pressures and severely reduced cardiac index by Fick, but no evidence of coronary disease> concern for potential tamponade. Chest CT with no evidence of swelling or effusion.  - TEE 9/11: No localized swelling or effusion. Required intubation 2/2 increased secretions and poor oxygenation.   Further decompensation overnight, driven largely by mental status worsening. ARF/acute liver failure this morning, lactic acid >9. Bedside echo with dilated RV, severe TR, underfilled LV, increasing pressor requirements.  Discussed with daughters at bedside. Grim prognosis, but they would like a trial of CRRT to attempt to improve RV geometry, mental status. Briefly had decided on DNR/DNI but they wished to alter later to full code.  Trialysis catheter placed without incident. While awaiting CRRT hook up, worsening peaked T waves and while giving calcium  had brief drop in BP and AV disassociation. Returned with bicarb and multiple calcium  pushes. Started on CRRT.   Objective:   Weight Range: 78.8 kg Body mass index is 27.62 kg/m.   Vital Signs:   Temp:  [96 F (35.6 C)-97.6 F (36.4 C)] 96 F (35.6 C) (09/14 0811) Pulse Rate:  [69-115] 91 (09/14 1345) Resp:  [16-35] 21 (09/14 1245) BP: (88-147)/(28-112) 140/49 (09/14 1400) SpO2:  [91 %-100 %] 100 % (09/14 1345) Arterial Line BP: (52-174)/(29-102) 119/47 (09/14 1400) FiO2 (%):  [40 %-100 %] 40 % (09/14 1202) Weight:  [78.8 kg] 78.8 kg (09/14 0645) Last BM Date : 12/29/23  Weight change: Filed Weights   12/27/23 0500 12/28/23 0750 12/29/23 0645  Weight: 74 kg 78.8 kg 78.8 kg   Intake/Output:   Intake/Output Summary (Last 24 hours) at 12/29/2023 1426 Last data filed at 12/29/2023  1400 Gross per 24 hour  Intake 2187.94 ml  Output 301 ml  Net 1886.94 ml    CVP 11 Physical Exam  General:  ill appearing Neck: JVD ~20 cm. RIJ CVC, CVP 22 Cor: RRR, systolic murmur Lungs: ventilated Extremities: 1+ edema  Neuro: intubated and sedated  Labs    CBC Recent Labs    12/29/23 0311 12/29/23 0446 12/29/23 1348  WBC 12.3*  --  13.0*  HGB 8.1* 7.8* 7.1*  HCT 25.3* 23.0* 21.6*  MCV 97.7  --  95.2  PLT 50*  --  36*   Basic Metabolic Panel Recent Labs    90/85/74 0041 12/29/23 0311 12/29/23 0446  NA 135 133* 130*  K 6.5* 6.4* 6.3*  CL 95* 95*  --   CO2 12* 13*  --   GLUCOSE 57* 249*  --   BUN 87* 88*  --   CREATININE 5.54* 5.47*  --   CALCIUM  7.1* 8.0*  --   MG 2.6*  --   --   PHOS 10.9*  10.9*  --   --    Liver Function Tests Recent Labs    12/29/23 0041 12/29/23 0311  AST  --  >7,000*  ALT  --  3,843*  ALKPHOS  --  59  BILITOT  --  2.2*  PROT  --  5.0*  ALBUMIN  3.0* 2.7*   Medications:   Scheduled Medications:  sodium chloride    Intravenous Once   arformoterol   15 mcg Nebulization BID   Chlorhexidine  Gluconate Cloth  6 each Topical Daily   cholecalciferol   1,000 Units Per Tube Daily  clopidogrel   75 mg Per Tube Daily   escitalopram   5 mg Per Tube Daily   heparin  sodium (porcine)       melatonin  1.5 mg Per Tube QHS   mouth rinse  15 mL Mouth Rinse Q2H   pantoprazole  (PROTONIX ) IV  40 mg Intravenous Q12H   polyethylene glycol  17 g Per Tube Daily   revefenacin   175 mcg Nebulization Daily   rocuronium   70 mg Intravenous Once   senna-docusate  2 tablet Per Tube QHS   sodium chloride  flush  3 mL Intravenous Q12H    Infusions:  sodium chloride      acetylcysteine      cefTRIAXone  (ROCEPHIN )  IV Stopped (12/28/23 1557)   DOBUTamine  7.5 mcg/kg/min (12/29/23 1400)   epinephrine  3 mcg/min (12/29/23 1400)   norepinephrine  (LEVOPHED ) Adult infusion     PrismaSol  BGK 2/3.5 400 mL/hr at 12/29/23 1315   PrismaSol  BGK 2/3.5 400 mL/hr at  12/29/23 1315   PrismaSol  BGK 2/3.5 1,500 mL/hr at 12/29/23 1314   propofol  (DIPRIVAN ) infusion 20 mcg/kg/min (12/29/23 1400)   sodium bicarbonate  75 mEq in sodium chloride  0.45 % 1,075 mL infusion 50 mL/hr at 12/29/23 1400   thiamine  (VITAMIN B1) injection Stopped (12/28/23 2223)   vasopressin  0.04 Units/min (12/29/23 1400)    PRN Medications: sodium chloride , acetaminophen , azelastine , famotidine , fentaNYL  (SUBLIMAZE ) injection, fentaNYL  (SUBLIMAZE ) injection, heparin , heparin  sodium (porcine), HYDROmorphone  (DILAUDID ) injection, ipratropium-albuterol , methocarbamol , midazolam , nitroGLYCERIN , mouth rinse, mouth rinse, prochlorperazine , sodium chloride  flush  Patient Profile  [name] is a 83 year old with history of mitral valve disease, carotid disease, breast cancer treated with resection, and 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 Admitted to ICU post Mitral Clip complicated by Pericardial Effusion and shock.  Assessment/Plan   S/P mTEER complicated by pericardial effusion>>RV failure>>cardiogenic shock - Pericardial Effusion post mitral clip. Emergent Pericardiocentesis with 350 cc of bloody output.  -Stop colchicine  with ARF - Worsening RV failure, multisystem organ failure - Increase dobutamine  to 7.5, Epi, NE, and, vasopressin  - Persistent RV dysfunction and severe TR post, CVP waveform consistent with severe RV failure - Worsening RV geometry and dilation on echo - Lactic acid >9 - L/RHC 9/11: with equalization of filling pressures and severely reduced cardiac index by Fick, but no evidence of coronary disease> concern for potential tamponade. TCTS notified, chest CT with no evidence of swelling or effusion. - Very grim prognosis   Mitral Valve Disease, S/P MTEER , Clip x2.  See above.    CAD 11/22/23 DES RCA.  Continue DAPT (ASA/plavix )  Acute liver failure:  - Thought due to RV failure, shock - Continue NAC  AKI> ARF - Anuric yesterday, woresning electrolytes and volume  status - Start CRRT as above  Hyperkalemia - Resolved  Acute respiratory failure - Reintubated  CRITICAL CARE Performed by: Morene [name]  Total critical care time: 120 minutes  Critical care time was exclusive of separately billable procedures and treating other patients.  Critical care was necessary to treat or prevent imminent or life-threatening deterioration.  Critical care was time spent personally by me on the following activities: development of treatment plan with patient and/or surrogate as well as nursing, discussions with consultants, evaluation of patient's response to treatment, examination of patient, obtaining history from patient or surrogate, ordering and performing treatments and interventions, ordering and review of laboratory studies, ordering and review of radiographic studies, pulse oximetry and re-evaluation of patient's condition.   Length of Stay: 4  Morene [name], MD  12/29/2023, 2:26 PM  Advanced Heart Failure Team Pager [phone] (M-F; 7a - 5p)  Please contact [facility] for night-coverage after hours (5p -7a ) and weekends on [email or web address]

## 2023-12-29 NOTE — Consult Note (Addendum)
 [facility] Nephrology Consultation Note  Requesting MD: Dr. Zenaida Duran Reason for consult: AKI, Hyperkalemia  HPI:  [name] is a 83 y.o. female with past medical history significant for hypertension, CAD with prior stent, CTO to mitral valve regurgitation with moderate to severe TR who was presented to hospital on 9/10 for severe MR, underwent planned mitral clip, seen as a consultation for evaluation of AKI and hyperkalemia. After mitral clip procedure the patient had developed worsening hypotension associated with chest pain and shortness of breath.  She was initially treated with albumin  and then a stat echocardiogram showed large pericardial effusion.  The patient underwent emergent pericardiocentesis with drain and 50 cc of fluid was removed and drain placed.  Subsequently she developed worsening hypotension, respiratory distress therefore treated with IV fluid bolus, started on pressures and patient was intubated for respiratory failure. At baseline she has normal serum creatinine level.  She required intermittent dose of IV diuretics to manage volume as well.  The creatinine level gradually worsened to 5.47 associated with a persistent hyperkalemia of potassium level 6.3, hyponatremia.  She has shock liver with elevated liver enzyme, AST level more than 7000, lactic acidosis with more than 9 lactic acid level.  She has leukocytosis, anemia and thrombocytopenia. The patient is currently intubated, sedated.  Receiving dobutamine , epinephrine , Levophed  and vasopressin .  The urine output is very minimal despite of current medical treatment.  The team is consulting us  to arrange CRRT.  ICU team is planning to place a line at this time. I spoke with the patient's daughter [name] about current critical situation and plan to start CRRT.  We discussed about complication of CRRT including catheter site bleeding, worsening hypotension, allergic/anaphylactic reaction to the filter  etc.  But at this time the benefit of CRRT outweighs the risk. The patient had a kidney ultrasound done yesterday with unremarkable appearing kidneys however some fluid in the pelvis.  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 Wendel    Past Surgical History:  Procedure Laterality Date   ABDOMINAL HYSTERECTOMY     partial   APPENDECTOMY     BREAST EXCISIONAL BIOPSY Bilateral    benign   BREAST SURGERY Bilateral    bx    CORONARY STENT INTERVENTION N/A 11/22/2023   Procedure: CORONARY STENT INTERVENTION;  Surgeon: Thukkani, Arun K, MD;  Location: [facility] INVASIVE CV LAB;  Service: Cardiovascular;  Laterality: N/A;   EYE SURGERY Bilateral    cataracts   LUNG REMOVAL, PARTIAL Right 01/26/2013   for Stage IA non small cell   PERICARDIOCENTESIS N/A 12/25/2023   Procedure: PERICARDIOCENTESIS;  Surgeon: Wendel Lurena POUR, MD;  Location: [facility] INVASIVE CV LAB;  Service: Cardiovascular;  Laterality: N/A;   RIGHT/LEFT HEART CATH AND CORONARY ANGIOGRAPHY N/A 11/22/2023   Procedure: RIGHT/LEFT HEART CATH AND CORONARY ANGIOGRAPHY;  Surgeon: Wendel Lurena POUR, MD;  Location: [facility] INVASIVE CV LAB;  Service: Cardiovascular;  Laterality: N/A;   RIGHT/LEFT HEART CATH AND CORONARY ANGIOGRAPHY N/A 12/26/2023   Procedure: RIGHT/LEFT HEART CATH AND CORONARY ANGIOGRAPHY;  Surgeon: Zenaida Katz  J, MD;  Location: [facility] INVASIVE CV LAB;  Service: Cardiovascular;  Laterality: N/A;   TONSILLECTOMY     TRANSCATHETER MITRAL EDGE TO EDGE REPAIR N/A 12/25/2023   Procedure: TRANSCATHETER MITRAL EDGE TO EDGE REPAIR;  Surgeon: Wendel Lurena POUR, MD;  Location: [facility] INVASIVE CV LAB;  Service:  Cardiovascular;  Laterality: N/A;   TRANSESOPHAGEAL ECHOCARDIOGRAM (CATH LAB) N/A 11/22/2023   Procedure: TRANSESOPHAGEAL ECHOCARDIOGRAM;  Surgeon: Delford Maude BROCKS, MD;  Location: [facility] INVASIVE CV LAB;  Service: Cardiovascular;  Laterality: N/A;   TRANSESOPHAGEAL ECHOCARDIOGRAM (CATH LAB) N/A 12/25/2023   Procedure: TRANSESOPHAGEAL ECHOCARDIOGRAM;  Surgeon: Wendel Lurena POUR, MD;  Location: [facility] INVASIVE CV LAB;  Service: Cardiovascular;  Laterality: N/A;   VIDEO ASSISTED THORACOSCOPY (VATS)/WEDGE RESECTION Right 01/26/2013   Procedure: VIDEO ASSISTED THORACOSCOPY (VATS) with wedge resection right middle lobe nodule , segmental resection rigth lower lobe lung, lymph node sampling,;  Surgeon: Elspeth BROCKS Millers, MD;  Location: [facility] OR;  Service: Thoracic;  Laterality: Right;    Family Hx:  Family History  Problem Relation Age of Onset   Lung cancer Father    Diabetes Mellitus II Father    Hypertension Father    Hyperlipidemia Father    Cancer - Other Brother     Social History:  reports that she quit smoking about 18 years ago. Her smoking use included cigarettes. She started smoking about 58 years ago. She has a 40 pack-year smoking history. She has never used smokeless tobacco. She reports that she does not drink alcohol  and does not use drugs.  Allergies:  Allergies  Allergen Reactions   Doxycycline Other (See Comments)    Upset stomach   Erythromycin Other (See Comments)    Upset stomach    Medications: Prior to Admission medications   Medication Sig Start Date End Date Taking? Authorizing Provider  ALPRAZolam  (XANAX ) 0.25 MG tablet Take 0.25 mg by mouth daily as needed for anxiety (prior to air travel or dental appointments). 09/04/16  Yes [provider]  amLODipine  (NORVASC ) 2.5 MG tablet Take 1 tablet (2.5 mg total) by mouth daily. 12/06/23  Yes Fountain, Madison L, NP  aspirin  EC 81 MG tablet Take 1 tablet (81 mg total) by mouth daily. Swallow whole. 11/22/23  Yes Thukkani,  Arun K, MD  atorvastatin  (LIPITOR) 10 MG tablet TAKE 1 TABLET (10 MG TOTAL) BY MOUTH 3 (THREE) TIMES A WEEK. TAKES M-W-F. 11/01/23  Yes Swinyer, Rosaline HERO, NP  azelastine  (ASTELIN ) 0.1 % nasal spray Place 1 spray into both nostrils 2 (two) times daily as needed for rhinitis or allergies. 10/24/23  Yes [provider]  Bempedoic Acid  (NEXLETOL ) 180 MG TABS Take 1 tablet (180 mg total) by mouth daily. 11/11/23  Yes Swinyer, Rosaline HERO, NP  cholecalciferol  (VITAMIN D3) 25 MCG (1000 UNIT) tablet Take 1,000 Units by mouth daily.   Yes [provider]  clopidogrel  (PLAVIX ) 75 MG tablet Take 1 tablet (75 mg total) by mouth daily. 12/06/23  Yes Fountain, Madison L, NP  escitalopram  (LEXAPRO ) 5 MG tablet Take 5 mg by mouth daily.   Yes [provider]  fluticasone (FLONASE ALLERGY RELIEF) 50 MCG/ACT nasal spray Place 1 spray into both nostrils daily as needed for allergies or rhinitis. 04/25/18  Yes [provider]  furosemide  (LASIX ) 20 MG tablet Take 1 tablet (20 mg total) by mouth 2 (two) times daily. 12/06/23 12/05/24 Yes Fountain, Madison L, NP  linaclotide (LINZESS) 72 MCG capsule Take 72 mcg by mouth daily before breakfast. Per patient skips  taking it when stomach is upset. Patient taking differently: Take 72 mcg by mouth as needed. Per patient skips taking it when stomach is upset. 01/26/19  Yes [provider]  meclizine  (ANTIVERT ) 25 MG tablet Take 25 mg by mouth 3 (three) times daily as needed for dizziness.  12/24/12  Yes [provider]  Melatonin 3 MG TBDP Take 1.5 mg by mouth at bedtime.   Yes [provider]  Multiple Vitamin (MULTIVITAMIN WITH MINERALS) TABS tablet Take 1 tablet by mouth every other day.   Yes [provider]  Omega-3 Krill Oil 500 MG CAPS Take 500 mg by mouth daily.   Yes [provider]  Polyvinyl Alcohol -Povidone (REFRESH OP) Place 1 drop into both eyes every 6 (six) hours as needed (dry eyes).   Yes  [provider]  denosumab  (PROLIA ) 60 MG/ML SOSY injection Inject 60 mg into the skin every 6 (six) months.    [provider]  famotidine  (PEPCID  AC) 10 MG tablet Take 10 mg by mouth daily as needed for heartburn or indigestion.    [provider]  meloxicam  (MOBIC ) 15 MG tablet Take 15 mg by mouth daily as needed for pain.  12/22/12   [provider]  methocarbamol  (ROBAXIN ) 500 MG tablet Take 500 mg by mouth every 8 (eight) hours as needed for muscle spasms.    [provider]  nitroGLYCERIN  (NITROSTAT ) 0.4 MG SL tablet Place 1 tablet (0.4 mg total) under the tongue every 5 (five) minutes as needed for chest pain. 11/22/23 11/21/24  Goodrich, Callie E, PA-C    I have reviewed the patient's current medications.  Labs: Renal Panel: Recent Labs  Lab 12/27/23 1436 12/28/23 0441 12/28/23 1537 12/29/23 0041 12/29/23 0311 12/29/23 0446  NA 136 133* 132* 135 133* 130*  K 4.4 5.1 5.9* 6.5* 6.4* 6.3*  CL 102 97* 98 95* 95*  --   CO2 18* 19* 15* 12* 13*  --   GLUCOSE 148* 108* 112* 57* 249*  --   BUN 61* 74* 82* 87* 88*  --   CREATININE 3.55* 4.45* 5.04* 5.54* 5.47*  --   CALCIUM  7.1* 7.0* 7.5* 7.1* 8.0*  --   MG  --   --   --  2.6*  --   --   PHOS  --   --   --  10.9*  10.9*  --   --      CBC:    Latest Ref Rng & Units 12/29/2023    4:46 AM 12/29/2023    3:11 AM 12/29/2023   12:41 AM  CBC  WBC 4.0 - 10.5 K/uL  12.3  12.1   Hemoglobin 12.0 - 15.0 g/dL 7.8  8.1  8.3   Hematocrit 36.0 - 46.0 % 23.0  25.3  25.9   Platelets 150 - 400 K/uL  50  57      Anemia Panel:  Recent Labs    12/28/23 1028 12/28/23 1537 12/29/23 0041 12/29/23 0311 12/29/23 0446  HGB 9.0* 8.7* 8.3* 8.1* 7.8*  MCV 94.9 95.8 96.3 97.7  --     Recent Labs  Lab 12/25/23 1359 12/29/23 0041 12/29/23 0311  AST 62*  --  >7,000*  ALT 38  --  3,843*  ALKPHOS 34*  --  59  BILITOT 1.0  --  2.2*  PROT 5.7*  --  5.0*  ALBUMIN  3.2* 3.0* 2.7*    No results found  for: HGBA1C  ROS:  Unable to obtain review  of system as patient is intubated and sedated. Physical Exam: Vitals:   12/29/23 1030 12/29/23 1045  BP: (!) 135/42 (!) 124/45  Pulse: 80 85  Resp: 18 18  Temp:    SpO2: 97% 97%     General exam: Critically ill looking female, intubated, sedated and on multiple pressors and lines.    Respiratory system: Coarse breath sound bilateral.   Cardiovascular system: S1 & S2 heard, RRR.  Trace pedal edema present. Gastrointestinal system: Abdomen is nondistended, soft . Central nervous system: Sedated Extremities: No cyanosis, trace edema Skin: No rashes, lesions or ulcers Psychiatry: Sedated  Assessment/Plan:  # Acute kidney injury likely ischemic ATN due to cardiogenic shock/right ventricular failure associated with oliguria, hyperkalemia, fluid overload and multiple metabolic abnormalities.  The patient is currently on multiple pressors for the shock. Plan to start CRRT after placement of HD catheter.  The heart failure team is placing a line, appreciated.  We will start with all 2K bath, UF as tolerated with blood pressure and try to avoid heparin  because of low platelet count and increased risk of bleeding. I have discussed above plan with patient's daughter [name] who agreed to proceed.  # Hyperkalemia: Received medical treatment, now restarting CRRT with low potassium bath.  We will monitor lab results and adjust CRRT prescription as indicated.  # Severe mitral regurgitation is status post mitral clip surgery surgery complicated by pericardial effusion, right ventricular failure, cardiogenic shock requiring emergent pericardiocentesis.  # Cardiogenic shock: Currently on multiple pressors and inotrope.  CRRT to optimize volume.  # Multiorgan failure including shock liver with transaminitis, AKI, respiratory failure, CHF.  # Hyponatremia, hypervolemic: Optimize with CRRT and ultrafiltration.  # Severe anion gap metabolic acidosis/lactic  acidosis: On sodium bicarbonate  IV, CRRT, pressors and ongoing management as above.  Discussed with ICU, heart failure team and with the patient's daughter.  Thank you for the consult.  We will continue to follow.  Damarion Mendizabal Amelie Romney 12/29/2023, 11:28 AM  [facility].

## 2023-12-29 NOTE — Progress Notes (Signed)
 12/29/2023 INR 8.8 likely due to acute liver failure Giving blood, vit K, and FFP Think we are approaching futility, will check labs again in AM. D/w AHF  Rolan Sharps MD PCCM

## 2023-12-29 NOTE — Progress Notes (Addendum)
 eLink Physician-Brief Progress Note Patient Name: [name] DOB: Jan 31, 1941 MRN: [id number]   Date of Service  12/29/2023  HPI/Events of Note  ABG reviewed: 7.3 7/21/221 Now on CRRT Bicarbonate infusion running at 50 cc/h  eICU Interventions  Calcium  supplementation Maintain CRRT per nephrology, hold bicarb infusion   0211 - Recurrent hypoglycemia, start D10 infusion  0509 - Pltc 27   INR 7.1 PT 63.8; repeat vitamin k   Intervention Category Intermediate Interventions: Hypotension - evaluation and management  Tennelle Taflinger 12/29/2023, 11:13 PM

## 2023-12-29 NOTE — Progress Notes (Signed)
 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 NYHA glass II symptoms w/ fatigue, dizziness on occasion. She has been on DAPT. Last TEE EF 55-50% svr MR. Seen by cardiac surg in consultation on 9/5 and felt to be poor candidate for surgical repair. Presented to [facility] on 9/10 for svr MR where she underwent planned mitral clip.  Post procedure developed acute hypotension, SOB and CP. Treated w/ albumin , stat echo showed large pericardial effusion. Had emergent pericardicentesis where was removed and drain placed. Sbp Subsequently improved, but then hypotensive again.. Transferred to ICU post procedure. Got 1 liter NS for POCUS suggesting volume depletion. Started on norepinephrine , right internal jugular CVL was placed for co-ox and CVP monitoring. PCCM asked to assist w/ RX Pertinent  Medical History  CAD s/p PCI w/ DESx3 ostial-distal RCA on 8/8. Her LEVDP was 27 MVR (severe) w/ flail segment and mod to severe tricuspid regurg.  Baseline NYHA class II HLD Bilateral Carotid artery stenosis HTN Lung cancer  Significant Hospital Events: Including procedures, antibiotic start and stop dates in addition to other pertinent events   9/9 admitted for mitral clipping. Developed acute onset of SOB, CP and hypotension post procedure. Had lg pericardial effusion. 350 ml drawn off and drain placed. Initially responded but subsequently developed hypotension. Started on NE. CVL placed. PCCM consulted to assist w care. On arrival reports severe chest and substernal chest pain. No sig output from drain since placement  9/11: intubated for TEE, RHC, angio  9/12: extubated   Interim History / Subjective:   Respiratory distress and  lethargy overnight, developed hypoxia, required intubation.  Hyperkalemia refractory to medical management. Rapidly rising vasopressor requirements. Yesterday never figured out why she had issues with swallowing and chin numbness- no CT findings to explain this and SLP evaluation was not able to identify a deficit to explain this.   Objective    Blood pressure (!) 119/52, pulse 70, temperature 97.6 F (36.4 C), temperature source Axillary, resp. rate 18, height 5' 6.5 (1.689 m), weight 78.8 kg, SpO2 100%. CVP:  [11 mmHg-41 mmHg] 11 mmHg  Vent Mode: PRVC FiO2 (%):  [60 %-100 %] 60 % Set Rate:  [15 bmp] 15 bmp Vt Set:  [480 mL] 480 mL PEEP:  [5 cmH20] 5 cmH20 Plateau Pressure:  [12 cmH20] 12 cmH20   Intake/Output Summary (Last 24 hours) at 12/29/2023 0718 Last data filed at 12/29/2023 0600 Gross per 24 hour  Intake 1244.19 ml  Output 268 ml  Net 976.19 ml   Filed Weights   12/27/23 0500 12/28/23 0750 12/29/23 0645  Weight: 74 kg 78.8 kg 78.8 kg    Examination: General: critically ill appearing woman lying in bed in NAD HENT [place]/AT, eyes anicteric, ETT in place Lungs: breathing comfortably on MV Cardiovascular: S1S2, RRR Abdomen: soft, NT Extremities: still no significant amount of edema Neuro: RASS -5 on propofol  GU small volume of tea colored urine  UOP 18cc  7.25/30/256/13 LA >9 Na+ 133 K+ 6.4 Bicarb 13 BUN 88 Cr 5.47 Ca+ 8 WBC 12.3 H/H 8.1/25.3 Platelets 50   Resolved problem list   Assessment and Plan  Shock; likely obstructive in setting of tamponade vs  cardiogenic due to RV failure Hemorrhagic pericardial effusion, pericarditis  Acute on chronic RV failure> this is likely the much more significant contributor after there was no tamponade able to be identified on CT or TEE once pericardial drain was placed.  S/p pericardiocentesis 9/10 with 400cc blood removed; had [facility] 9/11 demonstrating equalization of filling pressures and severe reduced cardiac index to  1.8 by Fick. Severe TR. No obstructive CAD, stent patent. Lactate has been labile. - con't inotropic support-- escalting today with rapidly worsening pressor requirements- added epi. Con't NE, DBA -added stress dose steroids -con't antibiotics -at the point that she needs CRRT> very grim prognosis for her to have progressive organ failure despite maximal therapy -tele monitoring -high dose aspirin  for now; long term will go back to low- dose  Severe MR s/p mTEER  CAD  HLD TEE with adequate placement of clip. Minimal MR, mild RV dysfunction. Severe TR -con't high dose aspirin   -plavix  for stent in 11/2023 -statin on hold -B-blocker on hold due to shock  Acute liver injury due to congestive hepatopathy; very low suspicion this could be DILI from anitbiotics with her hemodynamic compromise and concurrent other organ failures Refractory lactic acidosis; inability to clear is contributing -not sure that serial lactates will improve anything with high dose vasopressor/inotrope support already; she has had progressive RV failure despite maximal medical therapy  Acute hypoxic respiratory failure; reintubated for mental status. Concern for aspiration pneumonia vs atelectasis with infiltrates seen on CT COPD -pulmonary hygiene -deescalate antibiotics to ceftriaxone  -brovana  & yupelri  -pulmonary hygiene  Face numbness--not sure why; may have been following her first intubation  Acute metabolic encephalopathy due to liver injury and AKI -supportive care -intubated for airway protection  Elevated blood glucose likely due to decadron  in cath lab/ anesthesia; now borderline low BG with minimal insulin  requirements -can add SSI if needed  Acute kidney injury due to ATN from shock; not hydro on US  Hyperkalemia, refractory  Hypervolemic hyponatremia -strict I/O, con't foley -maintain adequate perfusion -calcium  boluses, meds to shift K+ -starting CRRT today for refractory hyperkalemia  H/o  lung cancer in remote past with RML wedge resection -OP follow up as previously scheduled  Thrombocytopenia- no clear explanation for this other than progressive MOF. Not on heparinoids.  -serial CBCs; may be getting close to needing a transfusion -check DIC panel  ABLA from pericardial effusion, now dilution -transfuse for Hb <7 or hemodynamically significant bleeding -DIC panel  Nausea due to RV failure -intubated  Deconditioning -PT, OT- on hold while intubated   Grim prognosis-- Dr. Zenaida updated daughter today. Her progression into worse MOF despite aggressive inotropic support the past few days and no reversible cause of her heart failure portends a very poor prognosis.    Labs   CBC: Recent Labs  Lab 12/28/23 0550 12/28/23 1028 12/28/23 1537 12/29/23 0041 12/29/23 0311 12/29/23 0446  WBC 9.3 10.9* 10.9* 12.1* 12.3*  --   HGB 8.6* 9.0* 8.7* 8.3* 8.1* 7.8*  HCT 26.5* 27.8* 27.2* 25.9* 25.3* 23.0*  MCV 95.3 94.9 95.8 96.3 97.7  --   PLT 74* 73* 61* 57* 50*  --     Basic Metabolic Panel: Recent Labs  Lab 12/27/23 1436 12/28/23 0441 12/28/23 1537 12/29/23 0041 12/29/23 0311 12/29/23 0446  NA 136 133* 132* 135 133* 130*  K 4.4 5.1 5.9* 6.5* 6.4* 6.3*  CL 102 97* 98 95* 95*  --   CO2 18* 19* 15* 12* 13*  --   GLUCOSE 148* 108*  112* 57* 249*  --   BUN 61* 74* 82* 87* 88*  --   CREATININE 3.55* 4.45* 5.04* 5.54* 5.47*  --   CALCIUM  7.1* 7.0* 7.5* 7.1* 8.0*  --   MG  --   --   --  2.6*  --   --   PHOS  --   --   --  10.9*  10.9*  --   --    GFR: Estimated Creatinine Clearance: 8.5 mL/min (A) (by C-G formula based on SCr of 5.47 mg/dL (H)). Recent Labs  Lab 12/28/23 0844 12/28/23 1028 12/28/23 1236 12/28/23 1525 12/28/23 1537 12/29/23 0041 12/29/23 0311 12/29/23 0315  WBC  --  10.9*  --   --  10.9* 12.1* 12.3*  --   LATICACIDVEN 3.4*  --  4.8* 5.2*  --   --   --  >9.0*     This patient is critically ill with multiple organ system failure which  requires frequent high complexity decision making, assessment, support, evaluation, and titration of therapies. This was completed through the application of advanced monitoring technologies and extensive interpretation of multiple databases. During this encounter critical care time was devoted to patient care services described in this note for 45 minutes.  Leita SHAUNNA Gaskins, DO 12/29/23 2:50 PM [facility]  For contact information, see Amion. If no response to pager, please call PCCM consult pager. After hours, 7PM- 7AM, please call Elink.

## 2023-12-29 NOTE — Significant Event (Addendum)
 Called overnight to intubate patient, she has been hyperkalemic overnight which was shifted with D50 and insulin , bicarb and Ca. Also hypoglycemic requiring an additional amp of D50 and minimal UOP.  Norepi requirement has up-trended from 2mcg to 15mcg overnight.  She became suddenly more altered and hypoxic, on my arrival was awake, but minimally responsive while being bagged.   Intubated, repeat labs including lactic, coox and ABG are pending.  If K not responding may need a nephrology consult and trialysis overnight, does not have severely worsened pulmonary edema on CXR. RN to update cardiology, tried to call both daughters to update them without response.   Leita SAUNDERS Avea Mcgowen, PA-C

## 2023-12-29 NOTE — Procedures (Signed)
 Intubation Procedure Note  GREG ECKRICH  [id number]  October 23, 1940  Date:12/29/23  Time:3:11 AM   Provider Performing:Thereasa Iannello R Janmichael Giraud    Procedure: Intubation (31500)  Indication(s) Respiratory Failure  Consent Risks of the procedure as well as the alternatives and risks of each were explained to the patient and/or caregiver.  Consent for the procedure was obtained and is signed in the bedside chart   Anesthesia Etomidate , Versed , and Fentanyl    Time Out Verified patient identification, verified procedure, site/side was marked, verified correct patient position, special equipment/implants available, medications/allergies/relevant history reviewed, required imaging and test results available.   Sterile Technique Usual hand hygeine, masks, and gloves were used   Procedure Description Patient positioned in bed supine.  Sedation given as noted above.  Patient was intubated with endotracheal tube using Glidescope.  View was Grade 1 full glottis .  Number of attempts was 1.  Colorimetric CO2 detector was consistent with tracheal placement.   Complications/Tolerance None; patient tolerated the procedure well. Chest X-ray is ordered to verify placement.   EBL Minimal   Specimen(s) None   Leita SAUNDERS Lindell Tussey, PA-C

## 2023-12-29 NOTE — Progress Notes (Signed)
 12/29/2023 Near arrest around noon. Calcium , bicarb given Vent adjusted Giving unit of blood as well No obvious source for bleeding and too unstable to scan Start CRRT Grim prognosis  Rolan Sharps MD PCCM

## 2023-12-29 NOTE — Procedures (Signed)
 Central Venous Catheter Insertion Procedure Note [name] [id number] 1941-01-17    Procedure: Insertion of Trialysis Catheter Indications: CRRT   Procedure Details Consent: Risks of procedure as well as the alternatives and risks of each were explained to the (patient/caregiver).  Consent for procedure obtained. Time Out: Verified patient identification, verified procedure, site/side was marked, verified correct patient position, special equipment/implants available, medications/allergies/relevent history reviewed, required imaging and test results available.  Performed   Maximum sterile technique was used including antiseptics, cap, gloves, gown, hand hygiene, mask and sheet. Skin prep: Chlorhexidine ; local anesthetic administered A antimicrobial bonded/coated Trialysis catheter was placed in the left internal jugular vein using the Seldinger technique and u/s guidance.   Evaluation Blood flow good Complications: No apparent complications Patient did tolerate procedure well. Chest X-ray ordered to verify placement.  CXR: no PTX, good placement   Melinda [name], MD  2:25 PM  [name] Advanced Heart Failure Mechanical Circulatory Support

## 2023-12-29 NOTE — Progress Notes (Signed)
 0140 received BMP lab result 0200 performed point of care blood glucose  0215 called Dr. Cesario MD and E-link for blood glucose 37 0221 administer D50 push 0230 patient decompensates/AMS --> zoll pads on patient, bagged airway, called E-link 0250 calcium  gluconate given 0255 providers Gleason PA-C at bedside 0300 50mg  fentanyl  given 0306 2 of versed  and 20 etomidate  given 0310 intubated 0336 daughter called and updated [name] [phone] Dr. Cesario MD updated RSI [phone] paged Dr. Cesario MD critical result lactic >9 and potassium 6.4 0434 new orders for sodium bicarbonate  bolus and waiting for sodium bicarb gtt

## 2023-12-29 NOTE — Progress Notes (Signed)
 Initial Nutrition Assessment  DOCUMENTATION CODES:   Not applicable  INTERVENTION:  When appropriate for feeds, initiate tube feeding via OGT: Vital 1.5 at 45 ml/h (1080 ml per day) Start at 25 and advance by 10mL every 12 hours to reach goal Prosource TF20 60 ml 1x/d Provides 1700 kcal, 93 gm protein, 825 ml free water  daily  NUTRITION DIAGNOSIS:   Increased nutrient needs related to acute illness as evidenced by estimated needs.  GOAL:   Patient will meet greater than or equal to 90% of their needs  MONITOR:   Vent status, I & O's, Labs, TF tolerance  REASON FOR ASSESSMENT:   Consult Enteral/tube feeding initiation and management  ASSESSMENT:  Pt with hx of HTN, HLD, CAD s/p PCI w/ DESx3 ostial-distal RCA on 8/8, severe MR, and hx of lung cancer presented to [facility] for planned mitral clip. Declined post-procedure, imaging suggestive of pericardial effusion and developed AKI.  9/10 - Op, mTEER, post op: pericardiocentesis, blood removed, drain placed 9/11 - intubation, TEE 9/12 - extubation 9/13 - SLP BSE, thin liquids recommended 9/14 - intubation   Patient is currently intubated on ventilator support. Pressor support x3, escalating infusion rates over the last 8 hours. Lactic acid rising and >9 on last draw. Significant electrolyte derangements on labs this AM. Pt had persistent hyperkalemia and hypoglycemia overnight.  Pt not metabolically stable at this time and clinical picture is tenuous. Would recommend holding feeds until electrolyte abnormalities are improving and lactic acid trending down. Discussed with MD, will hold feeds today. Recommendations above to be utilized when pt more metabolically stable.  MV: 9.4 L/min Temp (24hrs), Avg:97 F (36.1 C), Min:96 F (35.6 C), Max:97.6 F (36.4 C) MAP (art line): 60-73 mmHg this AM  Propofol : 9.46 ml/hr (250kcal/d at current infusion rate)  Admit weight: 72.2 kg (9/11, first measured)  Current weight:  78.8 kg   Intake/Output Summary (Last 24 hours) at 12/29/2023 0825 Last data filed at 12/29/2023 0600 Gross per 24 hour  Intake 1219.05 ml  Output 268 ml  Net 951.05 ml  Net IO Since Admission: 6,612.27 mL [12/29/23 0825]  Drains/Lines: Art line, left radial CVC IJ triple lumen OGT 14 Fr. out x 24 hours (XR 9/13 recommended advancing, appears to be more distal in chest XR 9/14) UOP 18mL x 24 hours  Nutritionally Relevant Medications: Scheduled Meds:  cholecalciferol   1,000 Units Per Tube Daily   colchicine   0.3 mg Per Tube Daily   pantoprazole  IV  40 mg Intravenous Q12H   polyethylene glycol  17 g Per Tube Daily   senna-docusate  2 tablet Per Tube QHS   sodium bicarbonate   1,300 mg Per Tube Q6H   Continuous Infusions:  sodium chloride      cefTRIAXone  (ROCEPHIN )  IV Stopped (12/28/23 1557)   DOBUTamine  5 mcg/kg/min (12/29/23 0600)   norepinephrine  (LEVOPHED ) Adult infusion 16 mcg/min (12/29/23 0600)   propofol  (DIPRIVAN ) infusion 20 mcg/kg/min (12/29/23 0600)   sodium bicarbonate  75 mEq in sodium chloride  0.45 % 1,075 mL infusion 50 mL/hr at 12/29/23 0629   thiamine  (VITAMIN B1) injection Stopped (12/28/23 2223)   vasopressin  0.03 Units/min (12/29/23 0609)   PRN Meds: famotidine , prochlorperazine   Labs Reviewed: Sodium 130, chloride 95 K 6.3 BUN 88, creatinine 5.47 Phosphorus 10.9 Magnesium 2.6 Ionized Calcium  0.93 CBG ranges from 37-249 mg/dL over the last 24 hours  Last LFTS: Lab Results  Component Value Date  ALT 3,843 (H) 12/29/2023  AST >7,000 (H) 12/29/2023  ALKPHOS 59 12/29/2023  BILITOT 2.2 (H) 12/29/2023    NUTRITION - FOCUSED PHYSICAL EXAM: Defer to in-person assessment  Diet Order:   Diet Order             Diet NPO time specified  Diet effective now                   EDUCATION NEEDS:  Not appropriate for education at this time  Skin:  Skin Assessment: Skin Integrity Issues: DTI: - bilateral anus  Last BM:  9/14 - type  6  Height:  Ht Readings from Last 1 Encounters:  12/29/23 5' 6.5 (1.689 m)    Weight:  Wt Readings from Last 1 Encounters:  12/29/23 78.8 kg    Ideal Body Weight:  65.9 kg  BMI:  Body mass index is 27.62 kg/m.  Estimated Nutritional Needs:  Kcal:  1600-1800 kcal/d Protein:  85-100 g/d Fluid:  1.8L/d    Vernell Lukes, RD, LDN, CNSC Registered Dietitian II Please reach out via secure chat

## 2023-12-29 NOTE — Progress Notes (Addendum)
 eLink Physician-Brief Progress Note Patient Name: [name] DOB: Feb 22, 1941 MRN: [id number]   Date of Service  12/29/2023  HPI/Events of Note  83 year old female that initially presented for management of her mitral valve disease status post clipping complicated by pericardial effusion and RV failure in the setting of cardiogenic/obstructive shock.  She has progressively developed worsening acute kidney injury complicated by acute respiratory failure.  Notified earlier this evening about hyperkalemia-this is managed by the primary team and hyperkalemia protocol.  Later notified about hypoglycemic episode treated with hypoglycemia protocol  Subsequently notified about progressive respiratory distress and lethargy with associated hypotension.  Now on dobutamine  at 5, norepinephrine  at 10, and CVP's reading in the 20s.  eICU Interventions  Given lethargy and minimal response, patient was initiated on bag mask ventilation.  Additional bicarbonate, glucose, and calcium  were administered  Ground team is immediately notified about respiratory failure and likely need for intubation, they assumed care at bedside   0517 -persistent hyperkalemia and metabolic acidemia.  Cardiology notified, initiated bicarb infusion.  LFTs consistent with shock liver.  Could consider addition of N-acetylcysteine , but given that it's high volume and this is likely secondary to congestive hepatopathy, will discuss with cards. Hold atorvastatin . New Thrombocytopenia noted also  0606 - Add NAC  Intervention Category Major Interventions: Respiratory failure - evaluation and management  [name] 12/29/2023, 2:49 AM

## 2023-12-30 DIAGNOSIS — I309 Acute pericarditis, unspecified: Secondary | ICD-10-CM | POA: Diagnosis not present

## 2023-12-30 DIAGNOSIS — E162 Hypoglycemia, unspecified: Secondary | ICD-10-CM

## 2023-12-30 DIAGNOSIS — Z95818 Presence of other cardiac implants and grafts: Secondary | ICD-10-CM | POA: Diagnosis not present

## 2023-12-30 DIAGNOSIS — R570 Cardiogenic shock: Secondary | ICD-10-CM | POA: Diagnosis not present

## 2023-12-30 DIAGNOSIS — Z9889 Other specified postprocedural states: Secondary | ICD-10-CM | POA: Diagnosis not present

## 2023-12-30 DIAGNOSIS — K7200 Acute and subacute hepatic failure without coma: Secondary | ICD-10-CM

## 2023-12-30 LAB — TYPE AND SCREEN
ABO/RH(D): O POS
Antibody Screen: NEGATIVE
Unit division: 0
Unit division: 0
Unit division: 0

## 2023-12-30 LAB — POCT I-STAT 7, (LYTES, BLD GAS, ICA,H+H)
Acid-base deficit: 17 mmol/L — ABNORMAL HIGH (ref 0.0–2.0)
Bicarbonate: 9.5 mmol/L — ABNORMAL LOW (ref 20.0–28.0)
Calcium, Ion: 0.84 mmol/L — CL (ref 1.15–1.40)
HCT: 36 % (ref 36.0–46.0)
Hemoglobin: 12.2 g/dL (ref 12.0–15.0)
O2 Saturation: 98 %
Patient temperature: 35.9
Potassium: 6.8 mmol/L (ref 3.5–5.1)
Sodium: 128 mmol/L — ABNORMAL LOW (ref 135–145)
TCO2: 10 mmol/L — ABNORMAL LOW (ref 22–32)
pCO2 arterial: 24.5 mmHg — ABNORMAL LOW (ref 32–48)
pH, Arterial: 7.189 — CL (ref 7.35–7.45)
pO2, Arterial: 117 mmHg — ABNORMAL HIGH (ref 83–108)

## 2023-12-30 LAB — COOXEMETRY PANEL
Carboxyhemoglobin: 0.9 % (ref 0.5–1.5)
Carboxyhemoglobin: 0.6 % (ref 0.5–1.5)
Carboxyhemoglobin: 1.2 % (ref 0.5–1.5)
Methemoglobin: <0.7 % (ref 0.0–1.5)
Methemoglobin: 1.6 % — ABNORMAL HIGH (ref 0.0–1.5)
Methemoglobin: <0.7 % (ref 0.0–1.5)
O2 Saturation: 67.9 %
O2 Saturation: 68.1 %
O2 Saturation: 62.6 %
Total hemoglobin: 11.6 g/dL — ABNORMAL LOW (ref 12.0–16.0)
Total hemoglobin: 12.1 g/dL (ref 12.0–16.0)
Total hemoglobin: 12.1 g/dL (ref 12.0–16.0)

## 2023-12-30 LAB — PROTIME-INR
INR: 7.1 (ref 0.8–1.2)
Prothrombin Time: 63.8 s — ABNORMAL HIGH (ref 11.4–15.2)

## 2023-12-30 LAB — BPAM FFP
Blood Product Expiration Date: 202509192359
Blood Product Expiration Date: 202509192359
Blood Product Expiration Date: 202509192359
ISSUE DATE / TIME: 202509141903
ISSUE DATE / TIME: 202509141903
ISSUE DATE / TIME: 202509141903
Unit Type and Rh: 5100
Unit Type and Rh: 5100
Unit Type and Rh: 5100

## 2023-12-30 LAB — RENAL FUNCTION PANEL
Albumin: 3 g/dL — ABNORMAL LOW (ref 3.5–5.0)
Anion gap: 21 — ABNORMAL HIGH (ref 5–15)
BUN: 35 mg/dL — ABNORMAL HIGH (ref 8–23)
CO2: 16 mmol/L — ABNORMAL LOW (ref 22–32)
Calcium: 8.6 mg/dL — ABNORMAL LOW (ref 8.9–10.3)
Chloride: 97 mmol/L — ABNORMAL LOW (ref 98–111)
Creatinine, Ser: 2.77 mg/dL — ABNORMAL HIGH (ref 0.44–1.00)
GFR, Estimated: 17 mL/min — ABNORMAL LOW (ref >60)
Glucose, Bld: 91 mg/dL (ref 70–99)
Phosphorus: 6.6 mg/dL — ABNORMAL HIGH (ref 2.5–4.6)
Potassium: 6 mmol/L — ABNORMAL HIGH (ref 3.5–5.1)
Sodium: 134 mmol/L — ABNORMAL LOW (ref 135–145)

## 2023-12-30 LAB — CBC
HCT: 35.2 % — ABNORMAL LOW (ref 36.0–46.0)
HCT: 36.6 % (ref 36.0–46.0)
Hemoglobin: 11.4 g/dL — ABNORMAL LOW (ref 12.0–15.0)
Hemoglobin: 11.8 g/dL — ABNORMAL LOW (ref 12.0–15.0)
MCH: 31 pg (ref 26.0–34.0)
MCH: 31.6 pg (ref 26.0–34.0)
MCHC: 32.4 g/dL (ref 30.0–36.0)
MCHC: 32.2 g/dL (ref 30.0–36.0)
MCV: 95.7 fL (ref 80.0–100.0)
MCV: 98.1 fL (ref 80.0–100.0)
Platelets: 27 K/uL — CL (ref 150–400)
Platelets: 50 K/uL — ABNORMAL LOW (ref 150–400)
RBC: 3.68 MIL/uL — ABNORMAL LOW (ref 3.87–5.11)
RBC: 3.73 MIL/uL — ABNORMAL LOW (ref 3.87–5.11)
RDW: 15.4 % (ref 11.5–15.5)
RDW: 16.3 % — ABNORMAL HIGH (ref 11.5–15.5)
WBC: 11 K/uL — ABNORMAL HIGH (ref 4.0–10.5)
WBC: 12.8 K/uL — ABNORMAL HIGH (ref 4.0–10.5)
nRBC: 38.2 % — ABNORMAL HIGH (ref 0.0–0.2)
nRBC: 53.2 % — ABNORMAL HIGH (ref 0.0–0.2)

## 2023-12-30 LAB — GLUCOSE, CAPILLARY
Glucose-Capillary: 165 mg/dL — ABNORMAL HIGH (ref 70–99)
Glucose-Capillary: 53 mg/dL — ABNORMAL LOW (ref 70–99)
Glucose-Capillary: 96 mg/dL (ref 70–99)
Glucose-Capillary: 49 mg/dL — ABNORMAL LOW (ref 70–99)
Glucose-Capillary: 63 mg/dL — ABNORMAL LOW (ref 70–99)
Glucose-Capillary: 129 mg/dL — ABNORMAL HIGH (ref 70–99)
Glucose-Capillary: 69 mg/dL — ABNORMAL LOW (ref 70–99)
Glucose-Capillary: 140 mg/dL — ABNORMAL HIGH (ref 70–99)
Glucose-Capillary: 76 mg/dL (ref 70–99)

## 2023-12-30 LAB — HEPATIC FUNCTION PANEL
ALT: 5769 U/L — ABNORMAL HIGH (ref 0–44)
AST: >7000 U/L — ABNORMAL HIGH (ref 15–41)
Albumin: 2.8 g/dL — ABNORMAL LOW (ref 3.5–5.0)
Alkaline Phosphatase: 163 U/L — ABNORMAL HIGH (ref 38–126)
Bilirubin, Direct: 2.3 mg/dL — ABNORMAL HIGH (ref 0.0–0.2)
Indirect Bilirubin: 1.8 mg/dL — ABNORMAL HIGH (ref 0.3–0.9)
Total Bilirubin: 4.1 mg/dL — ABNORMAL HIGH (ref 0.0–1.2)
Total Protein: 5.1 g/dL — ABNORMAL LOW (ref 6.5–8.1)

## 2023-12-30 LAB — BPAM RBC
Blood Product Expiration Date: 202510112359
Blood Product Expiration Date: 202510112359
Blood Product Expiration Date: 202510112359
ISSUE DATE / TIME: 202509141559
ISSUE DATE / TIME: 202509141821
ISSUE DATE / TIME: 202509141821
Unit Type and Rh: 5100
Unit Type and Rh: 5100
Unit Type and Rh: 5100

## 2023-12-30 LAB — PREPARE FRESH FROZEN PLASMA

## 2023-12-30 LAB — GLOBAL TEG PANEL
CK with Heparinase (R): 12.9 min — ABNORMAL HIGH (ref 4.3–8.3)
Citrated Kaolin (K): >5 min — ABNORMAL HIGH (ref 0.8–2.1)
Citrated Kaolin (MA): <40 mm — ABNORMAL LOW (ref 52–69)
Citrated Kaolin (R): 13 min — ABNORMAL HIGH (ref 4.6–9.1)
Citrated Kaolin Angle: 46.4 deg — ABNORMAL LOW (ref 63–78)
Citrated Rapid TEG (MA): <40 mm — ABNORMAL LOW (ref 52–70)

## 2023-12-30 LAB — BASIC METABOLIC PANEL WITH GFR
Anion gap: 28 — ABNORMAL HIGH (ref 5–15)
BUN: 14 mg/dL (ref 8–23)
CO2: 33 mmol/L — ABNORMAL HIGH (ref 22–32)
Calcium: 6.1 mg/dL — CL (ref 8.9–10.3)
Chloride: 91 mmol/L — ABNORMAL LOW (ref 98–111)
Creatinine, Ser: 1.31 mg/dL — ABNORMAL HIGH (ref 0.44–1.00)
GFR, Estimated: 41 mL/min — ABNORMAL LOW (ref >60)
Glucose, Bld: 80 mg/dL (ref 70–99)
Potassium: 6.3 mmol/L (ref 3.5–5.1)
Sodium: 152 mmol/L — ABNORMAL HIGH (ref 135–145)

## 2023-12-30 LAB — MAGNESIUM: Magnesium: 2 mg/dL (ref 1.7–2.4)

## 2023-12-30 LAB — TRIGLYCERIDES: Triglycerides: 426 mg/dL — ABNORMAL HIGH (ref <150)

## 2023-12-30 LAB — CG4 I-STAT (LACTIC ACID)
Lactic Acid, Venous: 13.1 mmol/L (ref 0.5–1.9)
Lactic Acid, Venous: 14 mmol/L (ref 0.5–1.9)

## 2023-12-30 LAB — FIBRINOGEN: Fibrinogen: 63 mg/dL — CL (ref 210–475)

## 2023-12-30 MED ORDER — PRISMASOL BGK 0/2.5 32-2.5 MEQ/L EC SOLN: Status: DC

## 2023-12-30 MED ORDER — DEXTROSE 10 % IV SOLN: INTRAVENOUS | Status: DC

## 2023-12-30 MED ORDER — SODIUM CHLORIDE 0.9 % IV SOLN
1.0000 g | Freq: Three times a day (TID) | INTRAVENOUS | Status: DC
  Filled 2023-12-30: qty 20

## 2023-12-30 MED ORDER — SODIUM CHLORIDE 0.9% IV SOLUTION: Freq: Once | INTRAVENOUS | Status: AC

## 2023-12-30 MED ORDER — PRISMASOL BGK 2/3.5 32-2-3.5 MEQ/L EC SOLN: Status: DC

## 2023-12-30 MED ORDER — SODIUM CHLORIDE 0.9 % IV SOLN
20.0000 ug | Freq: Once | INTRAVENOUS | Status: AC
  Administered 2023-12-30: 20 ug via INTRAVENOUS
  Filled 2023-12-30: qty 5

## 2023-12-30 MED ORDER — SODIUM CHLORIDE 0.9% IV SOLUTION
Freq: Once | INTRAVENOUS | Status: AC
Start: 2023-12-30 — End: 2023-12-30

## 2023-12-30 MED ORDER — VITAMIN K1 10 MG/ML IJ SOLN
10.0000 mg | Freq: Once | INTRAVENOUS | Status: AC
  Administered 2023-12-30: 10 mg via INTRAVENOUS
  Filled 2023-12-30: qty 1

## 2023-12-30 MED ORDER — VANCOMYCIN HCL 1750 MG/350ML IV SOLN
1750.0000 mg | Freq: Once | INTRAVENOUS | Status: AC
  Administered 2023-12-30: 1750 mg via INTRAVENOUS
  Filled 2023-12-30: qty 350

## 2023-12-30 MED ORDER — DEXTROSE 50 % IV SOLN
12.5000 g | Freq: Once | INTRAVENOUS | Status: AC
  Administered 2023-12-30: 12.5 g via INTRAVENOUS

## 2023-12-30 MED ORDER — DEXTROSE 50 % IV SOLN
12.5000 g | INTRAVENOUS | Status: AC
  Administered 2023-12-30: 12.5 g via INTRAVENOUS
  Filled 2023-12-30: qty 50

## 2023-12-30 MED ORDER — DEXTROSE 50 % IV SOLN
25.0000 g | INTRAVENOUS | Status: AC
  Administered 2023-12-30: 25 g via INTRAVENOUS
  Filled 2023-12-30: qty 50

## 2023-12-30 MED ORDER — SODIUM BICARBONATE 8.4 % IV SOLN: 50.0000 meq | Freq: Once | INTRAVENOUS | Status: DC

## 2023-12-30 MED ORDER — SODIUM BICARBONATE 8.4 % IV SOLN
100.0000 meq | Freq: Once | INTRAVENOUS | Status: AC
  Administered 2023-12-30: 100 meq via INTRAVENOUS

## 2023-12-30 MED ORDER — SODIUM ZIRCONIUM CYCLOSILICATE 10 G PO PACK
10.0000 g | PACK | Freq: Three times a day (TID) | ORAL | Status: DC
Start: 2023-12-30 — End: 2023-12-30

## 2023-12-30 MED ORDER — CALCIUM GLUCONATE-NACL 2-0.675 GM/100ML-% IV SOLN
2.0000 g | Freq: Once | INTRAVENOUS | Status: DC
Start: 2023-12-30 — End: 2023-12-30
  Filled 2023-12-30: qty 100

## 2023-12-31 LAB — PREPARE PLATELET PHERESIS: Unit division: 0

## 2023-12-31 LAB — PREPARE CRYOPRECIPITATE: Unit division: 0

## 2023-12-31 LAB — BPAM CRYOPRECIPITATE
Blood Product Expiration Date: 202509151804
ISSUE DATE / TIME: 202509151235
Unit Type and Rh: 6200

## 2023-12-31 LAB — BPAM PLATELET PHERESIS
Blood Product Expiration Date: 202509152359
ISSUE DATE / TIME: 202509150926
Unit Type and Rh: 5100

## 2023-12-31 LAB — BASIC METABOLIC PANEL WITH GFR
Anion gap: 21 — ABNORMAL HIGH (ref 5–15)
BUN: 35 mg/dL — ABNORMAL HIGH (ref 8–23)
CO2: 13 mmol/L — ABNORMAL LOW (ref 22–32)
Calcium: 8.1 mg/dL — ABNORMAL LOW (ref 8.9–10.3)
Chloride: 100 mmol/L (ref 98–111)
Creatinine, Ser: 2.78 mg/dL — ABNORMAL HIGH (ref 0.44–1.00)
GFR, Estimated: 16 mL/min — ABNORMAL LOW (ref >60)
Glucose, Bld: 87 mg/dL (ref 70–99)
Potassium: 5.6 mmol/L — ABNORMAL HIGH (ref 3.5–5.1)
Sodium: 134 mmol/L — ABNORMAL LOW (ref 135–145)

## 2023-12-31 NOTE — Anesthesia Postprocedure Evaluation (Signed)
 Anesthesia Post Note  Patient: [name]  Procedure(s) Performed: TRANSCATHETER MITRAL EDGE TO EDGE REPAIR TRANSESOPHAGEAL ECHOCARDIOGRAM     Patient location during evaluation: Cath Lab Anesthesia Type: General Level of consciousness: lethargic Pain management: pain level controlled Vital Signs Assessment: vitals unstable Respiratory status: spontaneous breathing, nonlabored ventilation and patient connected to nasal cannula oxygen Cardiovascular status: unstable Anesthetic complications: no   There were no known notable events for this encounter.  Patient acutely hypotensive post procedure, requiring volume resuscitation and pressors escalation. Bedside tte with evidence of post procedure pericardial effusion. Cardiologist at bedside and took patient to cath lab to manage themselves.                 Dominique Calvey

## 2024-01-02 ENCOUNTER — Ambulatory Visit: Admitting: Physician Assistant

## 2024-01-08 LAB — ECHO TEE

## 2024-01-10 ENCOUNTER — Ambulatory Visit: Admitting: Internal Medicine

## 2024-01-15 NOTE — Progress Notes (Signed)
 SLP Cancellation Note  Patient Details Name: MAHALIE KANNER MRN: [id number] DOB: 09-23-1940   Cancelled treatment:       Reason Eval/Treat Not Completed: Medical issues which prohibited therapy. Pt with change in medical status, now intubated. Will f/u as appropriate.     Leita SAILOR., M.A. CCC-SLP Acute Rehabilitation Services Office: [phone]  Secure chat preferred  01/25/2024, 7:29 AM

## 2024-01-15 NOTE — Progress Notes (Signed)
 Inpatient Rehab Admissions Coordinator:   Consult received and chart reviewed.  Events from yesterday noted.  CIR will sign off for now. If pt improves and therapy feels appropriate for CIR we will re-assess.  Reche Lowers, PT, DPT Admissions Coordinator [phone] 2024/01/02  10:06 AM

## 2024-01-15 NOTE — Progress Notes (Signed)
 Occupational Therapy Discharge Patient Details Name: [name] MRN: [id number] DOB: January 03, 1941 Today's Date: 01-20-24 Time:  -     Patient discharged from OT services secondary to medical decline - will need to re-order OT to resume therapy services.  Please see latest therapy progress note for current level of functioning and progress toward goals.    Progress and discharge plan discussed with patient and/or caregiver: . Intubated, sedated, and on CRRT. Will sign-off for now.  GO     [name] 2024-01-20, 11:01 AM

## 2024-01-15 NOTE — Progress Notes (Addendum)
 [facility]   MULTIDISCIPLINARY HEART VALVE TEAM  Patient Name: [name] Date of Encounter: Jan 03, 2024  Admit date: 12/25/2023   PCP:  Aisha Harvey, MD  [facility] Cardiologist:  Lurena MARLA Red, MD  [facility] Structural heart: Lurena MARLA Red, MD [facility] Electrophysiologist:  None   Hospital Problem List     Principal Problem:   S/P mitral valve clip implantation Active Problems:   Hyperlipidemia   S/P partial lobectomy of lung   Non-small cell cancer of right lung (HCC)   CAD (coronary artery disease)   Hypertension   Severe mitral regurgitation   Cardiogenic shock (HCC)   Acute respiratory failure with hypoxia (HCC)     Subjective   Intubated and sedated.   Inpatient Medications    Scheduled Meds:  arformoterol   15 mcg Nebulization BID   Chlorhexidine  Gluconate Cloth  6 each Topical Daily   cholecalciferol   1,000 Units Per Tube Daily   clopidogrel   75 mg Per Tube Daily   escitalopram   5 mg Per Tube Daily   hydrocortisone  sod succinate (SOLU-CORTEF ) inj  100 mg Intravenous Q12H   melatonin  1.5 mg Per Tube QHS   mouth rinse  15 mL Mouth Rinse Q2H   pantoprazole  (PROTONIX ) IV  40 mg Intravenous Q12H   polyethylene glycol  17 g Per Tube Daily   revefenacin   175 mcg Nebulization Daily   rocuronium   70 mg Intravenous Once   senna-docusate  2 tablet Per Tube QHS   sodium chloride  flush  3 mL Intravenous Q12H   Continuous Infusions:  sodium chloride      acetylcysteine  Stopped (12/29/23 1500)   cefTRIAXone  (ROCEPHIN )  IV Stopped (12/28/23 1557)   dextrose  20 mL/hr at 2024-01-03 0800   DOBUTamine  7.5 mcg/kg/min (01/03/2024 0800)   epinephrine  Stopped (12/29/23 1714)   norepinephrine  (LEVOPHED ) Adult infusion Stopped (03-Jan-2024 0737)   prismasol  BGK 0/2.5 2,000 mL/hr at 2024-01-03 0602   PrismaSol  BGK 2/3.5 400 mL/hr at 01/03/24 0745   PrismaSol  BGK 2/3.5 400 mL/hr at 01/03/2024 0745   propofol  (DIPRIVAN ) infusion Stopped (01-03-2024  0657)   vasopressin  0.03 Units/min (01-03-24 0800)   PRN Meds: sodium chloride , acetaminophen , azelastine , famotidine , fentaNYL  (SUBLIMAZE ) injection, fentaNYL  (SUBLIMAZE ) injection, heparin , HYDROmorphone  (DILAUDID ) injection, ipratropium-albuterol , methocarbamol , midazolam , nitroGLYCERIN , mouth rinse, mouth rinse, prochlorperazine , sodium chloride  flush   Vital Signs    Vitals:   01/03/24 0600 2024/01/03 0615 Jan 03, 2024 0630 January 03, 2024 0645  BP: (!) 159/75 (!) 158/69 (!) 167/70 (!) 145/66  Pulse: 77 76 79 78  Resp:      Temp: (!) 96.6 F (35.9 C) (!) 97 F (36.1 C) (!) 97 F (36.1 C) (!) 96.8 F (36 C)  TempSrc:      SpO2:      Weight:      Height:        Intake/Output Summary (Last 24 hours) at 01-03-24 0831 Last data filed at 01/03/24 0800 Gross per 24 hour  Intake 4300.52 ml  Output 4537 ml  Net -236.48 ml   Filed Weights   12/28/23 0750 12/29/23 0645 Jan 03, 2024 0500  Weight: 78.8 kg 78.8 kg 80.4 kg    Physical Exam    GEN: intubated and sedated HEENT: Grossly normal.  Cardiac: RRR, no murmurs, rubs, or gallops. No clubbing, cyanosis, widespread mild edema.   Respiratory:  mechanical breath sounds.  GI: Soft, nontender, nondistended, BS + x 4. MS: no deformity or atrophy. Skin: cyanosis of chin and digits. Left hand most affected.  Neuro:  Strength and sensation are intact. Psych: AAOx3.  Normal affect.  Labs    CBC Recent Labs    12/29/23 1348 12/29/23 1630 12/29/23 1729 12/29/23 2222 2024/01/06 0333  WBC 13.0*  --   --   --  11.0*  NEUTROABS 11.7*  --   --   --   --   HGB 7.1*   < >  --  11.9* 11.4*  HCT 21.6*   < >  --  35.0* 35.2*  MCV 95.2  --   --   --  95.7  PLT 36*  --  36*  --  27*   < > = values in this interval not displayed.   Basic Metabolic Panel: Recent Labs  Lab 12/28/23 1537 12/29/23 0041 12/29/23 0311 12/29/23 0446 12/29/23 1343 12/29/23 1630 12/29/23 1640 12/29/23 2222 01/06/24 0333  NA 132* 135 133*   < > 133* 135 134* 135  134*  134*  K 5.9* 6.5* 6.4*   < > 5.6* 5.0 5.0 5.2* 5.6*  6.0*  CL 98 95* 95*  --   --   --  94*  --  100  97*  CO2 15* 12* 13*  --   --   --  13*  --  13*  16*  GLUCOSE 112* 57* 249*  --   --   --  149*  --  87  91  BUN 82* 87* 88*  --   --   --  68*  --  35*  35*  CREATININE 5.04* 5.54* 5.47*  --   --   --  4.33*  --  2.78*  2.77*  CALCIUM  7.5* 7.1* 8.0*  --   --   --  9.1  --  8.1*  8.6*  MG  --  2.6*  --   --   --   --   --   --  2.0  PHOS  --  10.9*  10.9*  --   --   --   --  7.4*  --  6.6*   < > = values in this interval not displayed.   GFR: Estimated Creatinine Clearance: 16.9 mL/min (A) (by C-G formula based on SCr of 2.78 mg/dL (H)). Recent Labs  Lab 12/28/23 0844 12/28/23 1028 12/28/23 1236 12/28/23 1525 12/28/23 1537 12/29/23 0041 12/29/23 0311 12/29/23 0315 12/29/23 1348 2024-01-06 0333  WBC  --    < >  --   --    < > 12.1* 12.3*  --  13.0* 11.0*  LATICACIDVEN 3.4*  --  4.8* 5.2*  --   --   --  >9.0*  --   --    < > = values in this interval not displayed.   Liver Function Tests Recent Labs    12/29/23 0311 12/29/23 1640 01-06-2024 0333  AST >7,000*  --  >7,000*  ALT 3,843*  --  5,769*  ALKPHOS 59  --  163*  BILITOT 2.2*  --  4.1*  PROT 5.0*  --  5.1*  ALBUMIN  2.7* 2.6* 2.8*  3.0*     Telemetry    Sinus with HRs in 80s - Personally Reviewed  ECG    12/27/23 sinus with diffuse STE and short PR. HR 88 - Personally Reviewed  Patient Profile     [name] is a 83 y.o. female with a history of coronary artery disease s/p DES x 3 to the ostial to distal RCA (11/22/23), emphysema, lung cancer s/p  partial lung resection, polymyalgia rheumatica, HLD, carotid artery disease, HFpEF and severe mitral valve regurgitation who presented to [facility] on 12/25/23 for planned mTEER.   Assessment & Plan    RV failure with cardiogenic shock with shock liver and ARF: -- L/RHC 9/11: with equalization of filling pressures and severely reduced cardiac index by Fick,  but no evidence of coronary disease> concern for potential tamponade.  -- Chest CT with no evidence of swelling or effusion.  -- TEE 9/11: No localized swelling or effusion.  -- ARF/acute liver failure this morning, lactic acid >9. Bedside echo with dilated RV, severe TR, underfilled LV, increasing pressor requirements.  -- S/p 3u PRBCs and 3uPlt yesterday. INR 8.8>7.1. DIC panel negative.  -- Continue CRRT. -- Intubated and sedated.  -- Requiring multiple pressors. Appreciate PCCM and advanced CHF team.  -- Overall poor prognosis.   Severe MR:  -- S/p mTEER with with PASCAL ACE x 2  placed at A3/P3 by Dr Wendel on 12/25/23.  -- POD1 echo showed well placed clip with mild gradient 8 mmHh, minimal MR, mild RV dysfunction, mod-severe TR, no effusion  -- Groin site stable.    Pericardial effusion with cardiac tamponade/pericarditis: -- S/p pericardiocentesis 12/25/23 with evacuation of 400 cc of bloody fluid.   AKI  -- Baseline SCr ~0.9. -- SCr SCr 1.13>1.62>2.14>2.51>3.06> 5.47.  -- Started on CRRT. Creat 2.78 today. K 5.6. Appreciate nephrology.  CAD: -- S/p DES x 3 to the ostial to distal RCA (11/22/23).  -- Continue DAPT. -- Continue atorvastatin  10mg  daily.   Signed, Lamarr Hummer, PA-C  01/12/24, 8:31 AM  Pager [phone]   ATTENDING ATTESTATION:  After conducting a review of all available clinical information with the care team, interviewing the patient, and performing a physical exam, I agree with the findings and plan described in this note.   GEN: Intubated, sedated HEENT:  Intubated Cardiac: RRR, no murmurs, rubs, or gallops.  Respiratory: Clear to auscultation bilaterally. GI: Soft, nontender, non-distended  MS: No edema; No deformity. Neuro:  Nonfocal  Vasc:  +2 radial pulses  Patient remains intubated and sedated on multiple drips including dobutamine , vasopressin , norepinephrine ; unable to take much fluid off with CVVHD.  Unfortunately patient's lactate is  now 13 and has developed multiorgan failure.  I am concerned about an intra-abdominal issue given the fact that lactate is not clearing.  This is in the face of right ventricular shock of unknown etiology.  Discussed with Dr. Gardenia.  Poor prognosis.  I spoke with daughter Nat at bedside.  Lurena Wendel, MD Pager [phone]

## 2024-01-15 NOTE — Progress Notes (Signed)
 Advanced Heart Failure Rounding Note  Cardiologist: Lurena MARLA Red, MD  Chief Complaint: S/P mTEER complicated by pericardial effusion Subjective:   - s/p mTEER 12/25/23 -s/p pericardiocentesis 12/25/23. Repeat echo with no effusion - L/RHC 9/11: with equalization of filling pressures and severely reduced cardiac index by Fick, but no evidence of coronary disease> concern for potential tamponade. Chest CT with no evidence of swelling or effusion.  - TEE 9/11: No localized swelling or effusion. Required intubation 2/2 increased secretions and poor oxygenation.  - Further decompensation 7/13, driven largely by mental status worsening. ARF/acute liver failure this morning, lactic acid >9. Bedside echo with dilated RV, severe TR, underfilled LV, increasing pressor requirements. - 9/14: Trialysis catheter placed without incident. While awaiting CRRT hook up, worsening peaked T waves and while giving calcium  had brief drop in BP and AV disassociation. Returned with bicarb and multiple calcium  pushes. Started on CRRT.   S/p 3u PRBCs and 3uPlt yesterday. INR 8.8>7.1.   Co-ox 68%.   Intubated and sedated. CRRT ongoing.   Objective:   Weight Range: 80.4 kg Body mass index is 28.18 kg/m.   Vital Signs:   Temp:  [94.6 F (34.8 C)-98.2 F (36.8 C)] 96.8 F (36 C) 01-20-2024 0645) Pulse Rate:  [74-115] 78 01/20/24 0645) Resp:  [17-29] 28 01-20-2024 0006) BP: (86-167)/(28-112) 145/66 20-Jan-2024 0645) SpO2:  [76 %-100 %] 100 % (09/14 2300) Arterial Line BP: (72-173)/(26-77) 101/71 01/20/24 0645) FiO2 (%):  [40 %-100 %] 100 % 01/20/24 0312) Weight:  [80.4 kg] 80.4 kg 01-20-2024 0500) Last BM Date : 12/29/23  Weight change: Filed Weights   12/28/23 0750 12/29/23 0645 01/20/2024 0500  Weight: 78.8 kg 78.8 kg 80.4 kg   Intake/Output:   Intake/Output Summary (Last 24 hours) at 01/20/24 0729 Last data filed at 01/20/2024 0700 Gross per 24 hour  Intake 4358.67 ml  Output 3988 ml  Net 370.67 ml    CVP  14 Physical Exam  General:  intubated Neck: JVD UTA. RIJ CVC. LIJ HD Cor: Regular rate & rhythm.  Lungs: diminished Extremities: no edema. Cyanotic fingertips. BLE cool to touch.  GU: foley Neuro: not following commands, not on sedation Labs    CBC Recent Labs    12/29/23 1348 12/29/23 1630 12/29/23 1729 12/29/23 2222 01-20-24 0333  WBC 13.0*  --   --   --  11.0*  NEUTROABS 11.7*  --   --   --   --   HGB 7.1*   < >  --  11.9* 11.4*  HCT 21.6*   < >  --  35.0* 35.2*  MCV 95.2  --   --   --  95.7  PLT 36*  --  36*  --  27*   < > = values in this interval not displayed.   Basic Metabolic Panel Recent Labs    90/85/74 0041 12/29/23 0311 12/29/23 1640 12/29/23 2222 01-20-24 0333  NA 135   < > 134* 135 134*  134*  K 6.5*   < > 5.0 5.2* 5.6*  6.0*  CL 95*   < > 94*  --  100  97*  CO2 12*   < > 13*  --  13*  16*  GLUCOSE 57*   < > 149*  --  87  91  BUN 87*   < > 68*  --  35*  35*  CREATININE 5.54*   < > 4.33*  --  2.78*  2.77*  CALCIUM  7.1*   < >  9.1  --  8.1*  8.6*  MG 2.6*  --   --   --  2.0  PHOS 10.9*  10.9*  --  7.4*  --  6.6*   < > = values in this interval not displayed.   Liver Function Tests Recent Labs    12/29/23 0311 12/29/23 1640 01/03/2024 0333  AST >7,000*  --  >7,000*  ALT 3,843*  --  5,769*  ALKPHOS 59  --  163*  BILITOT 2.2*  --  4.1*  PROT 5.0*  --  5.1*  ALBUMIN  2.7* 2.6* 2.8*  3.0*   Medications:   Scheduled Medications:  arformoterol   15 mcg Nebulization BID   Chlorhexidine  Gluconate Cloth  6 each Topical Daily   cholecalciferol   1,000 Units Per Tube Daily   clopidogrel   75 mg Per Tube Daily   escitalopram   5 mg Per Tube Daily   hydrocortisone  sod succinate (SOLU-CORTEF ) inj  100 mg Intravenous Q12H   melatonin  1.5 mg Per Tube QHS   mouth rinse  15 mL Mouth Rinse Q2H   pantoprazole  (PROTONIX ) IV  40 mg Intravenous Q12H   polyethylene glycol  17 g Per Tube Daily   revefenacin   175 mcg Nebulization Daily   rocuronium   70 mg  Intravenous Once   senna-docusate  2 tablet Per Tube QHS   sodium chloride  flush  3 mL Intravenous Q12H    Infusions:  sodium chloride      acetylcysteine  Stopped (12/29/23 1500)   cefTRIAXone  (ROCEPHIN )  IV Stopped (12/28/23 1557)   dextrose  20 mL/hr at 01-03-24 0700   DOBUTamine  7.5 mcg/kg/min (2024/01/03 0700)   epinephrine  Stopped (12/29/23 1714)   norepinephrine  (LEVOPHED ) Adult infusion 1 mcg/min (January 03, 2024 0700)   prismasol  BGK 0/2.5 2,000 mL/hr at 01-03-24 0602   PrismaSol  BGK 2/3.5     PrismaSol  BGK 2/3.5     propofol  (DIPRIVAN ) infusion Stopped (2024/01/03 0657)   vasopressin  0.03 Units/min (01/03/24 0700)    PRN Medications: sodium chloride , acetaminophen , azelastine , famotidine , fentaNYL  (SUBLIMAZE ) injection, fentaNYL  (SUBLIMAZE ) injection, heparin , HYDROmorphone  (DILAUDID ) injection, ipratropium-albuterol , methocarbamol , midazolam , nitroGLYCERIN , mouth rinse, mouth rinse, prochlorperazine , sodium chloride  flush  Patient Profile  [name] is a 83 year old with history of mitral valve disease, carotid disease, breast cancer treated with resection, and 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 Admitted to ICU post Mitral Clip complicated by Pericardial Effusion and shock.  Assessment/Plan  S/P mTEER complicated by pericardial effusion>>RV failure>>cardiogenic shock>>MSOF - Pericardial Effusion post mitral clip. Emergent Pericardiocentesis with 350 cc of bloody output. - L/RHC 9/11: with equalization of filling pressures and severely reduced cardiac index by Fick, but no evidence of coronary disease> concern for potential tamponade. TCTS notified, chest CT with no evidence of swelling or effusion.  - Stop colchicine  with ARF - Worsening RV failure, now in multisystem organ failure - Decrease dobutamine  7.5>5. Continue vasopressin . Restart NE to allow higher pull from CRRT to offload RV.  - Persistent RV dysfunction and severe TR post, CVP waveform consistent with severe RV failure -  Worsening RV geometry and dilation on echo - Lactic acid >9. Repeat this morning.  - Very grim prognosis   Mitral Valve Disease, S/P MTEER , Clip x2.  - See above.    CAD - 11/22/23 DES RCA.  - Off ASA - Will stop plavix  with ABLA  Acute liver failure:  - Thought due to RV failure, shock - Continue NAC - INR 8.8>7.1, s/p vit K and FFP. Repeat level. Vit K  10 this morning.   AKI> ARF - Anuric, woresning electrolytes and volume status - Now on CRRT  Hyperkalemia - On CRRT  Acute respiratory failure - Reintubated 12/29/23  ABLA - s/p 3u PRBCs and 3uPlt yesterday.  - INR 8.8>7.1. - Hgb stable 11.4 - Plt 27 - Stop plavix  as above  CRITICAL CARE CRITICAL CARE Performed by: Beckey LITTIE Coe  Total critical care time: 20 minutes  Critical care time was exclusive of separately billable procedures and treating other patients.  Critical care was necessary to treat or prevent imminent or life-threatening deterioration.  Critical care was time spent personally by me on the following activities: development of treatment plan with patient and/or surrogate as well as nursing, discussions with consultants, evaluation of patient's response to treatment, examination of patient, obtaining history from patient or surrogate, ordering and performing treatments and interventions, ordering and review of laboratory studies, ordering and review of radiographic studies, pulse oximetry and re-evaluation of patient's condition.   Length of Stay: 5  Beckey LITTIE Coe, NP  2024/01/15, 7:29 AM  Advanced Heart Failure Team Pager [phone] (M-F; 7a - 5p)  Please contact [facility] for night-coverage after hours (5p -7a ) and weekends on [email or web address]

## 2024-01-15 NOTE — Progress Notes (Signed)
 PT Cancellation Note  Patient Details Name: [name] MRN: [id number] DOB: 04/08/1941   Cancelled Treatment:    Reason Eval/Treat Not Completed: Patient not medically ready  Significant decline since initial PT evaluation on Saturday. Intubated, sedated, and on CRRT. Will sign-off for now. Please re-order when medically appropriate to continue rehab efforts. Thank you.  Leontine Roads, PT, DPT [facility]  Rehabilitation Services Physical Therapist Office: [phone] Website: [email or web address]  Leontine GORMAN Roads 24-Jan-2024, 8:28 AM

## 2024-01-15 NOTE — Progress Notes (Signed)
 Rhythm change noted on monitor at 1400. EKG performed. EKG demonstrated sinus rhythm with 1st degree AV block and left bundle branch. Claudene, MD notified of EKG results. 1416 patient went asystole.Family and RN at bedside. MD called to bedside.

## 2024-01-15 NOTE — Progress Notes (Signed)
 2024-01-11 Bps are better proximally. Remove L radial A line Added cryo for low fibrinogen  Push fluid removal  Rolan Sharps MD PCCM

## 2024-01-15 NOTE — Procedures (Signed)
 Arterial Catheter Insertion Procedure Note  [name]  [id number]  January 27, 1941  Date:01-04-24  Time:12:47 PM    Provider Performing: Toribio JAYSON Sharps    Procedure: Insertion of Arterial Line (63379) with US  guidance (23062)   Indication(s) Blood pressure monitoring and/or need for frequent ABGs  Consent Risks of the procedure as well as the alternatives and risks of each were explained to the patient and/or caregiver.  Consent for the procedure was obtained and is signed in the bedside chart  Anesthesia None   Time Out Verified patient identification, verified procedure, site/side was marked, verified correct patient position, special equipment/implants available, medications/allergies/relevant history reviewed, required imaging and test results available.   Sterile Technique Maximal sterile technique including full sterile barrier drape, hand hygiene, sterile gown, sterile gloves, mask, hair covering, sterile ultrasound probe cover (if used).   Procedure Description Area of catheter insertion was cleaned with chlorhexidine  and draped in sterile fashion. With real-time ultrasound guidance an arterial catheter was placed into the right axillary artery.  Appropriate arterial tracings confirmed on monitor.     Complications/Tolerance None; patient tolerated the procedure well.   EBL Minimal   Specimen(s) None

## 2024-01-15 NOTE — Progress Notes (Signed)
 Jan 27, 2024 ABG severe metabolic acidemia along with rising K, low ionized calcium .  Calcium , bicarb ordered  May need to move up family discussion.  Rolan Sharps MD PCCM

## 2024-01-15 NOTE — Progress Notes (Signed)
 PHARMACY ANTIBIOTIC CONSULT NOTE   [name] a 83 y.o. female admitted on 9/10 for mTEER c/b pericardial effusion requiring pericardiocentesis, acute multiorgan failure, RV failure now with concern for sepsis .  Pharmacy has been consulted for Vancomycin  and meropenem  dosing.  S/p 3d Unasyn  (9/11 > 9/13), transitioned to Rocephin  9/13  9/15: Scr 4.33 (CRRT start 9/14 PM) >>2.78, LA >9.0 now 13.1,  WBC 13 > 11 Tm/24h 98.57F on CRRT  Estimated Creatinine Clearance: 16.9 mL/min (A) (by C-G formula based on SCr of 2.78 mg/dL (H)).  Plan: STOP Rocephin  START Merrem  1g IV Q8h  Vancomycin  1750 mg IV x1, then 1000 mg IV Q24h Monitor renal function, clinical status, de-escalation, C/S, levels as indicated   Allergies:  Allergies  Allergen Reactions   Doxycycline Other (See Comments)    Upset stomach   Erythromycin Other (See Comments)    Upset stomach    Filed Weights   12/28/23 0750 12/29/23 0645 January 27, 2024 0500  Weight: 78.8 kg (173 lb 11.6 oz) 78.8 kg (173 lb 11.6 oz) 80.4 kg (177 lb 4 oz)       Latest Ref Rng & Units 01/27/2024    3:33 AM 12/29/2023   10:22 PM 12/29/2023    5:29 PM  CBC  WBC 4.0 - 10.5 K/uL 11.0     Hemoglobin 12.0 - 15.0 g/dL 88.5  88.0    Hematocrit 36.0 - 46.0 % 35.2  35.0    Platelets 150 - 400 K/uL 27   36     Antibiotics Given (last 72 hours)     Date/Time Action Medication Dose Rate   12/27/23 1537 New Bag/Given   Ampicillin -Sulbactam (UNASYN ) 3 g in sodium chloride  0.9 % 100 mL IVPB 3 g 200 mL/hr   12/28/23 0419 New Bag/Given   Ampicillin -Sulbactam (UNASYN ) 3 g in sodium chloride  0.9 % 100 mL IVPB 3 g 200 mL/hr   12/28/23 1527 New Bag/Given   cefTRIAXone  (ROCEPHIN ) 2 g in sodium chloride  0.9 % 100 mL IVPB 2 g 200 mL/hr       Antimicrobials this admission: Unasyn  9/11 > 9/12 CRO 9/13 x1 Vancomycin  January 27, 2024 >> c Merrem  2024-01-27 >> c  Microbiology results: 9/10 surgical PCR: negative  Thank you for allowing pharmacy to be a part of this  patient's care.  Maurilio Fila, PharmD Clinical Pharmacist 2024-01-27  10:32 AM

## 2024-01-15 NOTE — Progress Notes (Signed)
 [facility] Progress Note  Name: [name] MRN: [id number] DOB: 1940/11/07   Subjective:  She has been on CRRT with 3.5 kg UF over 9/14.  She has no UOP charted over 9/14.  She has been on dextrose  infusion.  She was started on CRRt on 9/14 after placement of nontunneled catheter.  She has been on net negative 100 ml/hr per RN.  Initially was positive as she was getting so many blood products - got 2 PRBC's, 3 FFP on night shift.  She was initially on 8 mcg/min levo at start of shift and is now down to 3 mcg/min. Also on vasopressin  at 0.04 units/min and dobutamine . Per charting, critical care is concerned we may be approaching futility of care.    Review of systems:  Unable to obtain given intubated/obtunded  ---------------------- Background on consult:  [name] is a 83 y.o. female with past medical history significant for hypertension, CAD with prior stent, CTO to mitral valve regurgitation with moderate to severe TR who was presented to hospital on 9/10 for severe MR, underwent planned mitral clip, seen as a consultation for evaluation of AKI and hyperkalemia.  After mitral clip procedure the patient had developed worsening hypotension associated with chest pain and shortness of breath.  She was initially treated with albumin  and then a stat echocardiogram showed large pericardial effusion.  The patient underwent emergent pericardiocentesis with drain and 50 cc of fluid was removed and drain placed.  Subsequently she developed worsening hypotension, respiratory distress therefore treated with IV fluid bolus, started on pressures and patient was intubated for respiratory failure.  At baseline she has normal serum creatinine level.  She required intermittent dose of IV diuretics to manage volume as well.  The creatinine level gradually worsened to 5.47 associated with a persistent hyperkalemia of potassium level 6.3, hyponatremia.  She has shock liver with elevated liver  enzyme, AST level more than 7000, lactic acidosis with more than 9 lactic acid level.  She has leukocytosis, anemia and thrombocytopenia. The patient is currently intubated, sedated.  Receiving dobutamine , epinephrine , Levophed  and vasopressin .  The urine output is very minimal despite of current medical treatment.  The team is consulting us  to arrange CRRT.  ICU team is planning to place a line at this time.  I spoke with the patient's daughter [name] about current critical situation and plan to start CRRT.  We discussed about complication of CRRT including catheter site bleeding, worsening hypotension, allergic/anaphylactic reaction to the filter etc.  But at this time the benefit of CRRT outweighs the risk.  The patient had a kidney ultrasound done yesterday with unremarkable appearing kidneys however some fluid in the pelv   Intake/Output Summary (Last 24 hours) at 01-28-24 0544 Last data filed at 01-28-2024 0400 Gross per 24 hour  Intake 4313.06 ml  Output 3462 ml  Net 851.06 ml    Vitals:  Vitals:   01-28-24 0400 01/28/24 0415 01-28-24 0430 January 28, 2024 0445  BP: (!) 144/55 (!) 148/61 (!) 161/53 (!) 156/60  Pulse: 78 78 78 77  Resp:      Temp: (!) 97 F (36.1 C) (!) 96.8 F (36 C) (!) 96.8 F (36 C) (!) 96.8 F (36 C)  TempSrc:      SpO2:      Weight:      Height:         Physical Exam:  General elderly female in bed critically ill  HEENT normocephalic atraumatic extraocular movements intact sclera anicteric  Neck supple trachea midline Lungs coarse mechanical breath sounds bilaterally; FIO2 100 and PEEP 5 Heart S1S2  Abdomen soft nontender nondistended Extremities no pitting edema  Neuro - unable to assess 2/2 sedation  Access Left internal jugular nontunneled dialysis catheter   Medications reviewed   Labs:     Latest Ref Rng & Units January 02, 2024    3:33 AM 12/29/2023   10:22 PM 12/29/2023    4:40 PM  BMP  Glucose 70 - 99 mg/dL 91   850   BUN 8 - 23 mg/dL 35   68    Creatinine 9.55 - 1.00 mg/dL 7.22   5.66   Sodium 864 - 145 mmol/L 134  135  134   Potassium 3.5 - 5.1 mmol/L 6.0  5.2  5.0   Chloride 98 - 111 mmol/L 97   94   CO2 22 - 32 mmol/L 16   13   Calcium  8.9 - 10.3 mg/dL 8.6   9.1      Assessment/Plan:   # Acute kidney injury  - likely ischemic ATN due to cardiogenic shock/right ventricular failure associated with oliguria, hyperkalemia, fluid overload and multiple metabolic abnormalities.  The patient is currently on multiple pressors for the shock. - Continue CRRT   - Transition to 0K fluids as below and increase dialysate to 2.0 liters/hr.  UF at net negative 50-100 ml/hr as tolerated.  No heparin     # Hyperkalemia:  - on CRRT  - transition to 0K fluids  - BID electrolytes     # Severe mitral regurgitation  - status post mitral clip surgery surgery complicated by pericardial effusion, right ventricular failure, cardiogenic shock requiring emergent pericardiocentesis. - per heart failure team     # Cardiogenic shock:  - Currently on multiple pressors and inotrope.    - CRRT to optimize volume.   # Multiorgan failure including shock liver with transaminitis, AKI, respiratory failure, CHF.   # Hyponatremia, hypervolemic:  - Optimize volume status with CRRT as tolerated    # Severe anion gap metabolic acidosis/lactic acidosis:  - On sodium bicarbonate  IV, CRRT, pressors and ongoing management as above.  # Normocytic anemia  - Improved on most recent check and s/p PRBC's  Disposition - continue ICU monitoring  Katheryn JAYSON Saba, MD 01-02-24 6:07 AM

## 2024-01-15 NOTE — Progress Notes (Signed)
 01-17-24 Called as patient went into asystole. Pupils fixed and dilated, no spontaneous respirations, no heartbeat or pulse. TOD 14:16 Primary informed and condolences offered to family.  Rolan Sharps MD PCCM

## 2024-01-15 NOTE — Progress Notes (Signed)
 NAME:  [name], MRN:  [id number], DOB:  1940/10/03, LOS: 5 ADMISSION DATE:  12/25/2023, CONSULTATION DATE:  9/10s REFERRING MD:  [email or web address], CHIEF COMPLAINT:  critical care services    History of Present Illness:  83 year old female w/ sig h/o HTN, CAD w/ prior DESx 3 w/ LVED of 27, severe MVR and mod to severe TR. NYHA glass II symptoms w/ fatigue, dizziness on occasion. She has been on DAPT. Last TEE EF 55-50% svr MR. Seen by cardiac surg in consultation on 9/5 and felt to be poor candidate for surgical repair. Presented to [facility] on 9/10 for svr MR where she underwent planned mitral clip.  Post procedure developed acute hypotension, SOB and CP. Treated w/ albumin , stat echo showed large pericardial effusion. Had emergent pericardicentesis where was removed and drain placed. Sbp Subsequently improved, but then hypotensive again.. Transferred to ICU post procedure. Got 1 liter NS for POCUS suggesting volume depletion. Started on norepinephrine , right internal jugular CVL was placed for co-ox and CVP monitoring. PCCM asked to assist w/ RX Pertinent  Medical History  CAD s/p PCI w/ DESx3 ostial-distal RCA on 8/8. Her LEVDP was 27 MVR (severe) w/ flail segment and mod to severe tricuspid regurg.  Baseline NYHA class II HLD Bilateral Carotid artery stenosis HTN Lung cancer  Significant Hospital Events: Including procedures, antibiotic start and stop dates in addition to other pertinent events   9/9 admitted for mitral clipping. Developed acute onset of SOB, CP and hypotension post procedure. Had lg pericardial effusion. 350 ml drawn off and drain placed. Initially responded but subsequently developed hypotension. Started on NE. CVL placed. PCCM consulted to assist w care. On arrival reports severe chest and substernal chest pain. No sig output from drain since placement  9/11: intubated for TEE, RHC, angio  9/12: extubated   Interim History / Subjective:  On vent. Seen with AHF  team.  Objective    Blood pressure (!) 145/66, pulse 78, temperature (!) 96.8 F (36 C), resp. rate (!) 28, height 5' 6.5 (1.689 m), weight 80.4 kg, SpO2 100%. CVP:  [5 mmHg-24 mmHg] 11 mmHg  Vent Mode: PRVC FiO2 (%):  [40 %-100 %] 100 % Set Rate:  [15 bmp-28 bmp] 28 bmp Vt Set:  [480 mL] 480 mL PEEP:  [5 cmH20] 5 cmH20 Pressure Support:  [5 cmH20] 5 cmH20 Plateau Pressure:  [11 cmH20-19 cmH20] 19 cmH20   Intake/Output Summary (Last 24 hours) at 2024-01-23 0852 Last data filed at January 23, 2024 0800 Gross per 24 hour  Intake 4300.52 ml  Output 4537 ml  Net -236.48 ml   Filed Weights   12/28/23 0750 12/29/23 0645 01-23-24 0500  Weight: 78.8 kg 78.8 kg 80.4 kg    Examination: Mottled and ill appearing Ischemia of all digits and toes noted Ext cool to touch Sluggish pupils Triggers vent Trace edema Extensive bruising Oozing from all cath sites  Labs reviewed   Assessment and Plan   Severe MR s/p mTEER complicated by postop multiorgan failure primarily driven by RV dysfunction Congestive hepatopathy with frank liver failure and associated coagulopathy, thrombocytopenia CAD  HLD Acute metabolic encephalopathy due to low flow state now sedated on vent Acute kidney injury, hyperkalemia multifactorial with ATN, end organ ischmia, and cardiorenal driving H/o lung cancer in remote past with RML wedge resection ABLA from pericardial effusion, now dilution Refractory hypoglycemia, hypothermia, lactic acidemia- portend poor prognosis  Currently in heart, lung, liver, renal, skin, bone marrow, endocrine, CNS failure. Only option is  to try to pull fluid off and see if RV can recover with less preload.  Will give additional day of aggressive fluid removal but if any further setbacks would strongly recommend focusing on symptoms.  RE: hypoglycemia, d10 increased RE: coagulopathy, thrombocytopenia- additional plts/vit K this am; check noon TEG RE: discordant cuff and radial BP- I  think a more centrally located arterial line may give us  more accurate idea of perfusing pressures; will d/w family Questionable role for off-label NAC but we do not presently have access for this; liver issues are all secondary  Family coming in around 930 to talk further  45 min cc time independent of procedures Rolan Sharps MD PCCM

## 2024-01-15 NOTE — Progress Notes (Signed)
 Spoke with daughters at bedside reviewed where we are from physiology standpoint and recommended 24-48 trial of aggressive fluid removal to give RV a shot all agree that in event of death we allow it to be natural (ie DNR) said in 24-48h if we are in same spot or worse we need to consider transitioning GOC; timeline may move up if further deterioration.  Cardiology, AHF, and nephrology informed of this discussion.  Rolan Sharps MD PCCM

## 2024-01-15 NOTE — Death Summary Note (Signed)
 [facility]   MULTIDISCIPLINARY HEART VALVE TEAM  Death Summary    Patient ID: [name] MRN: [id number]; DOB: [birth year]  Admit date: 12/25/2023 Discharge date: 01-28-24  PCP:  Aisha Harvey, MD  [facility] Cardiologist:  Lurena MARLA Red, MD  [facility] Structural heart: Lurena MARLA Red, MD [facility] Electrophysiologist:  None   Discharge Diagnoses    Principal Problem:   Cardiogenic shock [facility]) Active Problems:   Hyperlipidemia   S/P partial lobectomy of lung   Non-small cell cancer of right lung (HCC)   CAD (coronary artery disease)   Hypertension   Severe mitral regurgitation   S/P mitral valve clip implantation   Acute respiratory failure with hypoxia (HCC)   Allergies Allergies  Allergen Reactions   Doxycycline Other (See Comments)    Upset stomach   Erythromycin Other (See Comments)    Upset stomach    History of Present Illness     [name] is a 83 y.o. female with a history of polymyalgia rheumatica, HLD, carotid artery disease, HFpEF and severe mitral valve regurgitation who presented to [facility] on 12/25/23 for planned mTEER.    Pt reported ongoing dyspnea. Echocardiogram 09/04/2023 showed LVEF 60 to 65%, myxomatous mitral valve with moderate to severe mitral valve regurgitation. Cardiac PET/CT 11/06/23 revealed large defect with moderate reduction in uptake in the mid to basal inferior and inferior septal locations that is reversible consistent with ischemia. TEE 11/22/2023 showed LVEF 55 to 60%, no RWMA, RV function and size normal, left atrial size severely dilated, severe anteriorly directed MR, flail segment of the P3 scallop, severe mitral valve regurgitation, moderate to severe tricuspid valve regurgitation with prolapse of the septal and anterior leaflets, mild aortic valve regurgitation. [facility] 11/22/23 showed severe single-vessel CAD with 70% stenosis of ostial to proximal RCA, 90% stenosis of proximal RCA, and 80% stenosis of  RCA with otherwise only minimal disease.  She underwent successful PCI with overlapping DES x 3 to the ostial to distal RCA. LVEDP was elevated at 27 mmHg.  She was started on DAPT with aspirin  81 mg daily and Plavix  75 mg daily with plans to continue this for 6 months followed by Plavix  monotherapy indefinitely.  She was also started on Lasix  20 mg daily.    The patient was evaluated by the multidisciplinary valve team and felt to have severe, symptomatic mitral regurgitation and to be a suitable candidate for mTEER, which was set up for 12/25/23.   Hospital Course     Consultants: PCCM, advanced CHF, nephrology.    S/p mTEER with with PASCAL ACE x 2 placed at A3/P3 by Dr Red on 12/25/23 . Post procedure developed acute hypotension, SOB and CP. Treated w/ albumin , stat echo showed large pericardial effusion. Had emergent pericardicentesis where was removed and drain placed. Sbp Subsequently improved, but then hypotensive again. Transferred to ICU post procedure.  Right internal jugular CVL was placed for co-ox and CVP monitoring. [facility] 9/11 with equalization of filling pressures and severely reduced cardiac index by Fick, but no evidence of coronary disease> concern for potential tamponade. Chest CT with no evidence of swelling or effusion. TEE 9/1 showed no localized swelling or effusion. She required intubation and subsequent multiorgan failure primarily driven by RV dysfunction. She developed congestive hepatopathy with frank liver failure and associated coagulopathy, thrombocytopenia, acute metabolic encephalopathy due to low flow state, acute kidney injury, hyperkalemia and started on CRRT 12/29/23. Despite this she developed refractory hypoglycemia, hypothermia, lactic acidemia  and ultimately expired on 01/16/2024 at 1416.   _____________   Duration of Encounter: APP Time: 15 minutes    Signed, Lamarr Hummer, PA-C 16-Jan-2024, 2:31 PM [phone]

## 2024-01-15 DEATH — deceased

## 2024-01-30 ENCOUNTER — Other Ambulatory Visit (HOSPITAL_COMMUNITY)

## 2024-01-30 ENCOUNTER — Ambulatory Visit: Admitting: Internal Medicine
